# Patient Record
Sex: Male | Born: 1935 | Race: White | Hispanic: No | Marital: Married | Smoking: Former smoker
Health system: Southern US, Community
[De-identification: ages and names within clinical notes are randomized; demographics above are authoritative.]

## PROBLEM LIST (undated history)

## (undated) DIAGNOSIS — Z860100 Personal history of colon polyps, unspecified: Secondary | ICD-10-CM

## (undated) DIAGNOSIS — I2510 Atherosclerotic heart disease of native coronary artery without angina pectoris: Secondary | ICD-10-CM

## (undated) DIAGNOSIS — M069 Rheumatoid arthritis, unspecified: Secondary | ICD-10-CM

## (undated) DIAGNOSIS — E785 Hyperlipidemia, unspecified: Secondary | ICD-10-CM

## (undated) DIAGNOSIS — M549 Dorsalgia, unspecified: Secondary | ICD-10-CM

## (undated) DIAGNOSIS — F0390 Unspecified dementia without behavioral disturbance: Secondary | ICD-10-CM

## (undated) DIAGNOSIS — M1990 Unspecified osteoarthritis, unspecified site: Secondary | ICD-10-CM

## (undated) DIAGNOSIS — I10 Essential (primary) hypertension: Secondary | ICD-10-CM

## (undated) DIAGNOSIS — I739 Peripheral vascular disease, unspecified: Secondary | ICD-10-CM

## (undated) DIAGNOSIS — J449 Chronic obstructive pulmonary disease, unspecified: Secondary | ICD-10-CM

## (undated) HISTORY — DX: Rheumatoid arthritis, unspecified: M06.9

## (undated) HISTORY — DX: Unspecified osteoarthritis, unspecified site: M19.90

## (undated) HISTORY — DX: Hyperlipidemia, unspecified: E78.5

## (undated) HISTORY — DX: Atherosclerotic heart disease of native coronary artery without angina pectoris: I25.10

## (undated) HISTORY — DX: Unspecified dementia, unspecified severity, without behavioral disturbance, psychotic disturbance, mood disturbance, and anxiety: F03.90

## (undated) HISTORY — DX: Essential (primary) hypertension: I10

## (undated) HISTORY — DX: Personal history of colon polyps, unspecified: Z86.0100

## (undated) HISTORY — PX: ILIAC ARTERY STENT: SHX1786

## (undated) HISTORY — DX: Personal history of colonic polyps: Z86.010

## (undated) HISTORY — DX: Peripheral vascular disease, unspecified: I73.9

## (undated) HISTORY — DX: Chronic obstructive pulmonary disease, unspecified: J44.9

---

## 1997-10-21 ENCOUNTER — Other Ambulatory Visit: Admission: RE | Admit: 1997-10-21 | Discharge: 1997-10-21 | Payer: Self-pay | Admitting: Cardiology

## 1997-10-24 ENCOUNTER — Ambulatory Visit (HOSPITAL_COMMUNITY): Admission: RE | Admit: 1997-10-24 | Discharge: 1997-10-24 | Payer: Self-pay | Admitting: Cardiology

## 1997-12-17 ENCOUNTER — Ambulatory Visit (HOSPITAL_COMMUNITY): Admission: RE | Admit: 1997-12-17 | Discharge: 1997-12-17 | Payer: Self-pay | Admitting: Critical Care Medicine

## 1998-07-21 ENCOUNTER — Ambulatory Visit (HOSPITAL_COMMUNITY): Admission: RE | Admit: 1998-07-21 | Discharge: 1998-07-21 | Payer: Self-pay | Admitting: Critical Care Medicine

## 1998-07-21 ENCOUNTER — Encounter: Payer: Self-pay | Admitting: Critical Care Medicine

## 1998-11-16 ENCOUNTER — Other Ambulatory Visit: Admission: RE | Admit: 1998-11-16 | Discharge: 1998-11-16 | Payer: Self-pay | Admitting: Gastroenterology

## 2000-03-09 ENCOUNTER — Ambulatory Visit (HOSPITAL_COMMUNITY): Admission: RE | Admit: 2000-03-09 | Discharge: 2000-03-09 | Payer: Self-pay | Admitting: Orthopedic Surgery

## 2000-03-09 ENCOUNTER — Encounter: Payer: Self-pay | Admitting: Orthopedic Surgery

## 2000-04-12 ENCOUNTER — Ambulatory Visit (HOSPITAL_COMMUNITY): Admission: RE | Admit: 2000-04-12 | Discharge: 2000-04-12 | Payer: Self-pay | Admitting: Orthopedic Surgery

## 2000-04-12 ENCOUNTER — Encounter: Payer: Self-pay | Admitting: Orthopedic Surgery

## 2001-05-04 ENCOUNTER — Emergency Department (HOSPITAL_COMMUNITY): Admission: EM | Admit: 2001-05-04 | Discharge: 2001-05-04 | Payer: Self-pay | Admitting: Emergency Medicine

## 2001-05-09 ENCOUNTER — Emergency Department (HOSPITAL_COMMUNITY): Admission: EM | Admit: 2001-05-09 | Discharge: 2001-05-09 | Payer: Self-pay | Admitting: Emergency Medicine

## 2003-10-13 ENCOUNTER — Encounter: Admission: RE | Admit: 2003-10-13 | Discharge: 2003-10-13 | Payer: Self-pay | Admitting: Critical Care Medicine

## 2004-01-26 ENCOUNTER — Ambulatory Visit (HOSPITAL_BASED_OUTPATIENT_CLINIC_OR_DEPARTMENT_OTHER): Admission: RE | Admit: 2004-01-26 | Discharge: 2004-01-26 | Payer: Self-pay | Admitting: Orthopedic Surgery

## 2004-01-26 ENCOUNTER — Ambulatory Visit (HOSPITAL_COMMUNITY): Admission: RE | Admit: 2004-01-26 | Discharge: 2004-01-26 | Payer: Self-pay | Admitting: Orthopedic Surgery

## 2004-05-17 ENCOUNTER — Ambulatory Visit: Payer: Self-pay | Admitting: Critical Care Medicine

## 2004-06-15 ENCOUNTER — Ambulatory Visit: Payer: Self-pay | Admitting: Critical Care Medicine

## 2004-07-14 ENCOUNTER — Ambulatory Visit: Payer: Self-pay | Admitting: Critical Care Medicine

## 2004-08-26 ENCOUNTER — Ambulatory Visit: Payer: Self-pay | Admitting: Critical Care Medicine

## 2004-10-04 ENCOUNTER — Ambulatory Visit: Payer: Self-pay | Admitting: Critical Care Medicine

## 2004-11-29 ENCOUNTER — Ambulatory Visit: Payer: Self-pay | Admitting: Critical Care Medicine

## 2005-01-31 ENCOUNTER — Ambulatory Visit: Payer: Self-pay | Admitting: Internal Medicine

## 2005-04-04 ENCOUNTER — Ambulatory Visit: Payer: Self-pay | Admitting: Critical Care Medicine

## 2005-07-06 ENCOUNTER — Ambulatory Visit: Payer: Self-pay | Admitting: Critical Care Medicine

## 2005-08-11 ENCOUNTER — Ambulatory Visit: Payer: Self-pay | Admitting: Critical Care Medicine

## 2005-09-23 ENCOUNTER — Ambulatory Visit: Payer: Self-pay | Admitting: Critical Care Medicine

## 2005-10-24 ENCOUNTER — Ambulatory Visit: Payer: Self-pay | Admitting: Critical Care Medicine

## 2005-12-08 ENCOUNTER — Ambulatory Visit: Payer: Self-pay | Admitting: Internal Medicine

## 2005-12-21 ENCOUNTER — Ambulatory Visit: Payer: Self-pay | Admitting: Critical Care Medicine

## 2006-01-09 ENCOUNTER — Ambulatory Visit: Payer: Self-pay | Admitting: Pulmonary Disease

## 2006-01-16 ENCOUNTER — Ambulatory Visit: Payer: Self-pay | Admitting: Critical Care Medicine

## 2006-01-17 ENCOUNTER — Encounter: Payer: Self-pay | Admitting: Critical Care Medicine

## 2006-01-17 ENCOUNTER — Ambulatory Visit: Payer: Self-pay | Admitting: Cardiology

## 2006-01-17 ENCOUNTER — Emergency Department (HOSPITAL_COMMUNITY): Admission: EM | Admit: 2006-01-17 | Discharge: 2006-01-17 | Payer: Self-pay | Admitting: *Deleted

## 2006-01-23 ENCOUNTER — Ambulatory Visit: Payer: Self-pay | Admitting: Pulmonary Disease

## 2006-01-29 ENCOUNTER — Emergency Department (HOSPITAL_COMMUNITY): Admission: EM | Admit: 2006-01-29 | Discharge: 2006-01-29 | Payer: Self-pay | Admitting: Emergency Medicine

## 2006-02-07 ENCOUNTER — Ambulatory Visit: Payer: Self-pay | Admitting: Critical Care Medicine

## 2006-03-13 ENCOUNTER — Ambulatory Visit: Payer: Self-pay | Admitting: Critical Care Medicine

## 2006-04-13 ENCOUNTER — Ambulatory Visit: Payer: Self-pay | Admitting: Critical Care Medicine

## 2006-04-28 ENCOUNTER — Encounter: Admission: RE | Admit: 2006-04-28 | Discharge: 2006-04-28 | Payer: Self-pay | Admitting: Internal Medicine

## 2006-06-16 ENCOUNTER — Ambulatory Visit: Payer: Self-pay | Admitting: Critical Care Medicine

## 2006-06-29 ENCOUNTER — Ambulatory Visit: Payer: Self-pay | Admitting: Pulmonary Disease

## 2006-10-20 ENCOUNTER — Ambulatory Visit: Payer: Self-pay | Admitting: Critical Care Medicine

## 2007-03-21 DIAGNOSIS — I2510 Atherosclerotic heart disease of native coronary artery without angina pectoris: Secondary | ICD-10-CM

## 2007-03-21 DIAGNOSIS — E785 Hyperlipidemia, unspecified: Secondary | ICD-10-CM

## 2007-03-21 DIAGNOSIS — I10 Essential (primary) hypertension: Secondary | ICD-10-CM

## 2007-03-21 DIAGNOSIS — J449 Chronic obstructive pulmonary disease, unspecified: Secondary | ICD-10-CM

## 2007-03-21 DIAGNOSIS — Z860100 Personal history of colon polyps, unspecified: Secondary | ICD-10-CM | POA: Insufficient documentation

## 2007-03-21 DIAGNOSIS — M1990 Unspecified osteoarthritis, unspecified site: Secondary | ICD-10-CM | POA: Insufficient documentation

## 2007-04-13 ENCOUNTER — Encounter: Admission: RE | Admit: 2007-04-13 | Discharge: 2007-04-13 | Payer: Self-pay | Admitting: Internal Medicine

## 2007-05-02 ENCOUNTER — Ambulatory Visit: Payer: Self-pay | Admitting: Critical Care Medicine

## 2007-05-18 ENCOUNTER — Ambulatory Visit: Payer: Self-pay | Admitting: Critical Care Medicine

## 2007-06-26 ENCOUNTER — Inpatient Hospital Stay (HOSPITAL_COMMUNITY): Admission: RE | Admit: 2007-06-26 | Discharge: 2007-06-26 | Payer: Self-pay | Admitting: Cardiology

## 2007-06-27 ENCOUNTER — Ambulatory Visit: Payer: Self-pay | Admitting: Critical Care Medicine

## 2007-06-27 DIAGNOSIS — I739 Peripheral vascular disease, unspecified: Secondary | ICD-10-CM

## 2007-06-28 ENCOUNTER — Telehealth (INDEPENDENT_AMBULATORY_CARE_PROVIDER_SITE_OTHER): Payer: Self-pay | Admitting: *Deleted

## 2007-07-09 ENCOUNTER — Telehealth (INDEPENDENT_AMBULATORY_CARE_PROVIDER_SITE_OTHER): Payer: Self-pay | Admitting: *Deleted

## 2007-10-02 ENCOUNTER — Encounter: Admission: RE | Admit: 2007-10-02 | Discharge: 2007-10-02 | Payer: Self-pay | Admitting: Internal Medicine

## 2007-10-03 ENCOUNTER — Ambulatory Visit: Payer: Self-pay | Admitting: Critical Care Medicine

## 2007-10-03 DIAGNOSIS — J309 Allergic rhinitis, unspecified: Secondary | ICD-10-CM | POA: Insufficient documentation

## 2007-10-05 ENCOUNTER — Encounter: Payer: Self-pay | Admitting: Critical Care Medicine

## 2007-11-07 ENCOUNTER — Ambulatory Visit: Payer: Self-pay | Admitting: Internal Medicine

## 2007-11-07 DIAGNOSIS — J209 Acute bronchitis, unspecified: Secondary | ICD-10-CM

## 2008-02-10 ENCOUNTER — Inpatient Hospital Stay (HOSPITAL_COMMUNITY): Admission: EM | Admit: 2008-02-10 | Discharge: 2008-02-14 | Payer: Self-pay | Admitting: Emergency Medicine

## 2008-03-24 ENCOUNTER — Telehealth (INDEPENDENT_AMBULATORY_CARE_PROVIDER_SITE_OTHER): Payer: Self-pay | Admitting: *Deleted

## 2008-08-08 ENCOUNTER — Ambulatory Visit: Payer: Self-pay | Admitting: Emergency Medicine

## 2009-02-16 ENCOUNTER — Emergency Department (HOSPITAL_COMMUNITY): Admission: EM | Admit: 2009-02-16 | Discharge: 2009-02-16 | Payer: Self-pay | Admitting: Emergency Medicine

## 2009-02-18 ENCOUNTER — Telehealth: Payer: Self-pay | Admitting: Critical Care Medicine

## 2009-02-20 ENCOUNTER — Ambulatory Visit: Payer: Self-pay | Admitting: Critical Care Medicine

## 2009-03-27 ENCOUNTER — Ambulatory Visit: Payer: Self-pay | Admitting: Critical Care Medicine

## 2009-04-15 ENCOUNTER — Ambulatory Visit: Payer: Self-pay | Admitting: Critical Care Medicine

## 2009-05-12 ENCOUNTER — Ambulatory Visit: Payer: Self-pay | Admitting: Pulmonary Disease

## 2009-05-12 ENCOUNTER — Telehealth (INDEPENDENT_AMBULATORY_CARE_PROVIDER_SITE_OTHER): Payer: Self-pay | Admitting: *Deleted

## 2009-05-14 ENCOUNTER — Ambulatory Visit: Payer: Self-pay | Admitting: Critical Care Medicine

## 2009-05-14 ENCOUNTER — Telehealth (INDEPENDENT_AMBULATORY_CARE_PROVIDER_SITE_OTHER): Payer: Self-pay | Admitting: *Deleted

## 2009-05-14 ENCOUNTER — Ambulatory Visit: Payer: Self-pay | Admitting: Diagnostic Radiology

## 2009-05-14 ENCOUNTER — Ambulatory Visit (HOSPITAL_BASED_OUTPATIENT_CLINIC_OR_DEPARTMENT_OTHER): Admission: RE | Admit: 2009-05-14 | Discharge: 2009-05-14 | Payer: Self-pay | Admitting: Critical Care Medicine

## 2009-05-21 ENCOUNTER — Ambulatory Visit: Payer: Self-pay | Admitting: Critical Care Medicine

## 2009-06-10 ENCOUNTER — Telehealth (INDEPENDENT_AMBULATORY_CARE_PROVIDER_SITE_OTHER): Payer: Self-pay | Admitting: *Deleted

## 2009-06-10 ENCOUNTER — Ambulatory Visit: Payer: Self-pay | Admitting: Critical Care Medicine

## 2009-06-10 DIAGNOSIS — J018 Other acute sinusitis: Secondary | ICD-10-CM

## 2009-06-11 ENCOUNTER — Ambulatory Visit: Payer: Self-pay | Admitting: Internal Medicine

## 2009-06-15 ENCOUNTER — Encounter: Payer: Self-pay | Admitting: Critical Care Medicine

## 2009-06-26 ENCOUNTER — Encounter: Payer: Self-pay | Admitting: Critical Care Medicine

## 2009-07-21 ENCOUNTER — Encounter: Payer: Self-pay | Admitting: Critical Care Medicine

## 2009-08-11 HISTORY — PX: CORONARY ANGIOPLASTY: SHX604

## 2009-08-20 ENCOUNTER — Ambulatory Visit: Payer: Self-pay | Admitting: Critical Care Medicine

## 2009-08-31 ENCOUNTER — Ambulatory Visit: Payer: Self-pay | Admitting: Cardiology

## 2009-08-31 ENCOUNTER — Ambulatory Visit: Payer: Self-pay | Admitting: Internal Medicine

## 2009-08-31 ENCOUNTER — Encounter: Payer: Self-pay | Admitting: Emergency Medicine

## 2009-08-31 ENCOUNTER — Inpatient Hospital Stay (HOSPITAL_COMMUNITY): Admission: EM | Admit: 2009-08-31 | Discharge: 2009-09-02 | Payer: Self-pay | Admitting: Internal Medicine

## 2009-09-15 ENCOUNTER — Encounter: Payer: Self-pay | Admitting: Physician Assistant

## 2009-09-15 ENCOUNTER — Ambulatory Visit: Payer: Self-pay | Admitting: Internal Medicine

## 2009-10-20 ENCOUNTER — Encounter: Payer: Self-pay | Admitting: Cardiology

## 2009-10-22 ENCOUNTER — Ambulatory Visit: Payer: Self-pay | Admitting: Critical Care Medicine

## 2009-10-22 ENCOUNTER — Telehealth (INDEPENDENT_AMBULATORY_CARE_PROVIDER_SITE_OTHER): Payer: Self-pay | Admitting: *Deleted

## 2009-10-23 LAB — CONVERTED CEMR LAB
Alkaline Phosphatase: 66 units/L (ref 39–117)
Bilirubin, Direct: 0.2 mg/dL (ref 0.0–0.3)
Indirect Bilirubin: 0.7 mg/dL (ref 0.0–0.9)
LDL Cholesterol: 82 mg/dL (ref 0–99)
Total Bilirubin: 0.9 mg/dL (ref 0.3–1.2)
Total Protein: 7.4 g/dL (ref 6.0–8.3)
VLDL: 34 mg/dL (ref 0–40)

## 2009-11-19 ENCOUNTER — Encounter: Payer: Self-pay | Admitting: Critical Care Medicine

## 2009-12-09 ENCOUNTER — Ambulatory Visit: Payer: Self-pay | Admitting: Cardiology

## 2009-12-09 ENCOUNTER — Encounter: Payer: Self-pay | Admitting: Cardiology

## 2009-12-09 DIAGNOSIS — I209 Angina pectoris, unspecified: Secondary | ICD-10-CM

## 2010-02-24 ENCOUNTER — Encounter: Payer: Self-pay | Admitting: Cardiology

## 2010-02-24 ENCOUNTER — Ambulatory Visit: Payer: Self-pay | Admitting: Cardiology

## 2010-06-17 ENCOUNTER — Ambulatory Visit
Admission: RE | Admit: 2010-06-17 | Discharge: 2010-06-17 | Payer: Self-pay | Source: Home / Self Care | Attending: Critical Care Medicine | Admitting: Critical Care Medicine

## 2010-07-15 NOTE — Assessment & Plan Note (Signed)
Summary: Pulmonary OV   Primary Provider/Referring Provider:  Larina Earthly  CC:  Follow up.  Pt states breathing is doing good overall.  head congestion, runny nose, and prod cough with clear mucus x 4-5 days. Denies wheezing and chest tightness.Marland Kitchen  History of Present Illness:  77 year old, white male with chronic obstructive lung disease.   August 20, 2009 12:11 PM Doing ok since early dec/10 The pt notes occ sinus issues.  The dyspnea is at baseline.    There have been no flare ups of mucous since back on advair. Pt denies any increase in rescue therapy over baseline, denies waking up needing it or having any early am or nocturnal exacerbations of coughing/wheezing/or dyspnea. Pt denies any significant sore throat, nasal congestion or excess secretions, fever, chills, sweats, unintended weight loss, pleurtic or exertional chest pain, orthopnea PND, or leg swelling   Oct 22, 2009 10:58 AM Pt had AMI  3/21- 3/23.   had adverse reaction to [place]. Had difficulty with dyspnea while in hospital.  PTCA of first OM.  had NON Stemi  no further cp since hosp stay,  no cough now,  dyspnea is ok   June 17, 2010 3:08 PM Doing ok since last ov , now though has a uri wiht pndrip and cough sl amount of phlegm color of mucus is white.  no real chest pain.  no real wheeze.  Dyspnea is the same.   symptoms worse for 4 days  August 20, 2009 12:11 PM Doing ok since early dec/10 occ sinus issues.  dyspnea is at baseline.    no flare ups of mucous since back on advair.   Current Medications (verified): 1)  Ecotrin 325 Mg Tbec (Aspirin) .... Take One Daily 2)  Aricept 23 Mg Tabs (Donepezil Hcl) .... Take One Daily 3)  Advair Diskus 250-50 Mcg/dose  Misc (Fluticasone-Salmeterol) .... One Puff Twice Daily 4)  Proair Hfa 108 (90 Base) Mcg/act Aers (Albuterol Sulfate) .Marland Kitchen.. 1-2 Puffs Every 4-6 Hours As Needed For Shortness of Breath 5)  Nitrostat 0.4 Mg Subl (Nitroglycerin) .... Take One As Needed 6)   Simvastatin 40 Mg Tabs (Simvastatin) .... Take One Daily 7)  Lumigan 0.03 % Soln (Bimatoprost) .... Once A Day 8)  Amlodipine Besylate 5 Mg Tabs (Amlodipine Besylate) .... Take One Tablet By Mouth Daily 9)  Trazodone Hcl 50 Mg Tabs (Trazodone Hcl) .... As Needed 10)  Vitamin B-12 1000 Mcg Tabs (Cyanocobalamin) .... Take 1 Tablet By Mouth Once A Day 11)  Vitamin D 1000 Unit Tabs (Cholecalciferol) .... Take 1 Tablet By Mouth Once A Day  Allergies (verified): 1)  ! Penicillin 2)  ! * Ambien  Past History:  Past medical, surgical, family and social histories (including risk factors) reviewed, and no changes noted (except as noted below).  Past Medical History: Reviewed history from 12/09/2009 and no changes required. PERIPHERAL VASCULAR DISEASE (ICD-443.9) OSTEOARTHRITIS (ICD-715.90) HYPERTENSION (ICD-401.9) HYPERLIPIDEMIA (ICD-272.4) CORONARY ARTERY DISEASE (ICD-414.00)    -Non Stemi 3/11    -PTCA first OM 3/11 COPD (ICD-496) COLONIC POLYPS, HX OF (ICD-V12.72) Dementia    Past Surgical History: Reviewed history from 10/22/2009 and no changes required.  PVD S/P stenting of left common iliac and external iliac Cath with PTCA first OM 3/11  Family History: Reviewed history from 10/03/2007 and no changes required. non contrib  Social History: Reviewed history from 12/09/2009 and no changes required. The patient lives in [place] with his wife.  He still   works part-time (not physical work).  He has remote tobacco abuse, quitting 18 years ago.  He drinks approximately six beers per week and  denies any binge drinking.  No illicit drugs.  No herbal medications,  regular diet.  No regular exercise.   Review of Systems       The patient complains of shortness of breath with activity, productive cough, and non-productive cough.  The patient denies shortness of breath at rest, coughing up blood, chest pain, irregular heartbeats, acid heartburn, indigestion, loss of appetite, weight  change, abdominal pain, difficulty swallowing, sore throat, tooth/dental problems, headaches, nasal congestion/difficulty breathing through nose, sneezing, itching, ear ache, anxiety, depression, hand/feet swelling, joint stiffness or pain, rash, change in color of mucus, and fever.    Vital Signs:  Patient profile:   77 year old male Height:      67 inches Weight:      203 pounds BMI:     31.91 O2 Sat:      96 % on Room air Temp:     98.1 degrees F oral Pulse rate:   58 / minute BP sitting:   140 / 70  (left arm) Cuff size:   large  Vitals Entered By: Gweneth Dimitri RN (June 17, 2010 2:55 PM)  O2 Flow:  Room air CC: Follow up.  Pt states breathing is doing good overall.  head congestion, runny nose, prod cough with clear mucus x 4-5 days. Denies wheezing and chest tightness. Comments Medications reviewed with patient Daytime contact number verified with patient. Gweneth Dimitri RN  June 17, 2010 3:00 PM    Physical Exam  Additional Exam:  GENERAL:  A/Ox3; pleasant & cooperative.NAD HEENT:  [place]/AT, EOM-wnl, PERRLA, EACs-clear, TMs-wnl, NOSE-bilateral nasal purulencer, THROAT-clear & wnl. NECK:  Supple w/ fair ROM; no JVD; normal carotid impulses w/o bruits; no thyromegaly or nodules palpated; no lymphadenopathy. CHEST:  distant BS, improved airflow HEART:  RRR, no m/r/g  heard ABDOMEN:  Soft & nt; nml bowel sounds; no organomegaly or masses detected. EXT: Warm bilat,  no calf pain, edema, clubbing, pulses intact Skin: no rash/lesion    Impression & Recommendations:  Problem # 1:  ACUTE BRONCHITIS (ICD-466.0) Assessment Deteriorated mild acute tracheobronchitis flare  plan zpak  no steroids cont inhaled meds as prescribed His updated medication list for this problem includes:    Advair Diskus 250-50 Mcg/dose Misc (Fluticasone-salmeterol) ..... One puff twice daily    Proair Hfa 108 (90 Base) Mcg/act Aers (Albuterol sulfate) .Marland Kitchen... 1-2 puffs every 4-6 hours as needed for  shortness of breath    Azithromycin 250 Mg Tabs (Azithromycin) .Marland Kitchen..Marland Kitchen Two by mouth now and then one daily and stay  Medications Added to Medication List This Visit: 1)  Azithromycin 250 Mg Tabs (Azithromycin) .... Two by mouth now and then one daily and stay  Complete Medication List: 1)  Ecotrin 325 Mg Tbec (Aspirin) .... Take one daily 2)  Aricept 23 Mg Tabs (Donepezil hcl) .... Take one daily 3)  Advair Diskus 250-50 Mcg/dose Misc (Fluticasone-salmeterol) .... One puff twice daily 4)  Proair Hfa 108 (90 Base) Mcg/act Aers (Albuterol sulfate) .Marland Kitchen.. 1-2 puffs every 4-6 hours as needed for shortness of breath 5)  Nitrostat 0.4 Mg Subl (Nitroglycerin) .... Take one as needed 6)  Simvastatin 40 Mg Tabs (Simvastatin) .... Take one daily 7)  Lumigan 0.03 % Soln (Bimatoprost) .... Once a day 8)  Amlodipine Besylate 5 Mg Tabs (Amlodipine besylate) .... Take one tablet by mouth daily 9)  Trazodone Hcl 50 Mg  Tabs (Trazodone hcl) .... As needed 10)  Vitamin B-12 1000 Mcg Tabs (Cyanocobalamin) .... Take 1 tablet by mouth once a day 11)  Vitamin D 1000 Unit Tabs (Cholecalciferol) .... Take 1 tablet by mouth once a day 12)  Azithromycin 250 Mg Tabs (Azithromycin) .... Two by mouth now and then one daily and stay  Other Orders: Est. Patient Level IV (18299)  Patient Instructions: 1)  Zithromax two first day then one a day and stay 2)  No other medication changes 3)  Return 4 months  Prescriptions: AZITHROMYCIN 250 MG TABS (AZITHROMYCIN) Two by mouth now and then one daily and stay  #1 pak x 0   Entered and Authorized by:   Storm Frisk MD   Signed by:   Storm Frisk MD on 06/17/2010   Method used:   Electronically to        [facility]  [address] [phone]* (retail)       [address]       [place], [place]  [address]       Ph: [phone]       Fax: [phone]   RxID:   [id number] PROAIR HFA 108 (90 BASE) MCG/ACT AERS (ALBUTEROL SULFATE) 1-2 puffs every 4-6 hours as needed for shortness  of breath  #1 x 6   Entered and Authorized by:   Storm Frisk MD   Signed by:   Storm Frisk MD on 06/17/2010   Method used:   Electronically to        [facility]  [address] [phone]* (retail)       [address]       [place], [place]  [address]       Ph: [phone]       Fax: [phone]   RxID:   [id number] ADVAIR DISKUS 250-50 MCG/DOSE  MISC (FLUTICASONE-SALMETEROL) One puff twice daily  #60 Each x 6   Entered and Authorized by:   Storm Frisk MD   Signed by:   Storm Frisk MD on 06/17/2010   Method used:   Electronically to        [facility]  [address] [phone]* (retail)       [address]       [place], [place]  [address]       Ph: [phone]       Fax: [phone]   RxID:   [id number]    Immunization History:  Influenza Immunization History:    Influenza:  historical (03/13/2010)    Prevention & Chronic Care Immunizations   Influenza vaccine: Historical  (03/13/2010)    Tetanus booster: Not documented    Pneumococcal vaccine: Pneumovax (Medicare)  (03/27/2009)    H. zoster vaccine: Not documented  Colorectal Screening   Hemoccult: Not documented    Colonoscopy: Not documented  Other Screening   PSA: Not documented   Smoking status: quit > 6 months  (10/22/2009)  Lipids   Total Cholesterol: 154  (10/20/2009)   LDL: 82  (10/20/2009)   LDL Direct: Not documented   HDL: 38  (10/20/2009)   Triglycerides: 172  (10/20/2009)    SGOT (AST): 21  (10/20/2009)   SGPT (ALT): 15  (10/20/2009)   Alkaline phosphatase: 66  (10/20/2009)   Total bilirubin: 0.9  (10/20/2009)  Hypertension   Last Blood Pressure: 140 / 70  (06/17/2010)   Serum creatinine: Not documented   Serum potassium Not documented  Self-Management Support :    Hypertension  self-management support: Not documented    Lipid self-management support: Not documented

## 2010-07-15 NOTE — Progress Notes (Signed)
Summary: Ventolin changed to [employer]  Phone Note Outgoing Call   Call placed by: Michel Bickers CMA,  Oct 22, 2009 3:51 PM Call placed to: Patient Summary of Call: Form received from [facility] in [place]. Pt's insurance will not cover the Ventolin HFA inhaler but will cover Proair. New RX sent for Proair to the pharmacy and the patient is aware and is okay with the change. I will forward to Dr. Delford Field so he is aware of the change. The patient's medication list has been updated. Initial call taken by: Michel Bickers CMA,  Oct 22, 2009 3:54 PM  Follow-up for Phone Call        ok  to change  Follow-up by: Storm Frisk MD,  Oct 23, 2009 6:33 AM    New/Updated Medications: PROAIR HFA 108 (90 BASE) MCG/ACT AERS (ALBUTEROL SULFATE) 1-2 puffs every 4-6 hours as needed for shortness of breath Prescriptions: PROAIR HFA 108 (90 BASE) MCG/ACT AERS (ALBUTEROL SULFATE) 1-2 puffs every 4-6 hours as needed for shortness of breath  #1 x 6   Entered by:   Michel Bickers CMA   Authorized by:   Storm Frisk MD   Signed by:   Michel Bickers CMA on 10/22/2009   Method used:   Electronically to        [facility]  [id number] (retail)       [address]       [place], [place]  [address]       Ph: [phone]       Fax: [phone]   RxID:   [id number]

## 2010-07-15 NOTE — Miscellaneous (Signed)
Summary: Orders Update   Clinical Lists Changes  Orders: Added new Referral order of ENT Referral (ENT) - Signed 

## 2010-07-15 NOTE — Assessment & Plan Note (Signed)
Summary: eph/jml   Visit Type:  Follow-up Primary Provider:  Larina Earthly  CC:  sob.  History of Present Illness: this is a 77, white male patient, who had a non-ST elevation MI August 31, 2009 treated with angioplasty of a first obtuse marginal. He had an old total occlusion of the RCA and moderate nonobstructive disease in the LAD. Stenting was not performed secondary to the small size of the vessel.  The patient has had a few twinges in his left chest since his MI, but denies any chest pain, radiation to his arm or neck or symptoms that brought him to the hospital. He has chronic dyspnea on exertion, secondary to his COPD. He is walking twice daily without difficulty.  Current Medications (verified): 1)  Ecotrin 325 Mg Tbec (Aspirin) .... Take One Daily 2)  Aricept 23 Mg Tabs (Donepezil Hcl) .... Take One Daily 3)  Advair Diskus 250-50 Mcg/dose  Misc (Fluticasone-Salmeterol) .... One Puff Twice Daily 4)  Ventolin Hfa 108 (90 Base) Mcg/act  Aers (Albuterol Sulfate) .Marland Kitchen.. 1-2 Puffs Every 4-6 Hours As Needed 5)  Vitamin D 1000 Unit Tabs (Cholecalciferol) .... Once Daily 6)  Vitamin B-12 1000 Mcg Tabs (Cyanocobalamin) .... Two Times A Day 7)  Nasonex 50 Mcg/act  Susp (Mometasone Furoate) .... Two Puffs Each Nostril Daily 8)  Plavix 75 Mg Tabs (Clopidogrel Bisulfate) .... Take One Daily 9)  Nitrostat 0.4 Mg Subl (Nitroglycerin) .... Take One As Needed 10)  Simvastatin 40 Mg Tabs (Simvastatin) .... Take One Daily  Allergies: 1)  ! Penicillin 2)  ! * Ambien  Past History:  Past Medical History: Last updated: 11/07/2007 ALLERGIC RHINITIS (ICD-477.9) PERIPHERAL VASCULAR DISEASE (ICD-443.9) OSTEOARTHRITIS (ICD-715.90) HYPERTENSION (ICD-401.9) HYPERLIPIDEMIA (ICD-272.4) CORONARY ARTERY DISEASE (ICD-414.00) COPD (ICD-496) COLONIC POLYPS, HX OF (ICD-V12.72)    Review of Systems       see the history of present illness  Vital Signs:  Patient profile:   77 year old male Height:       67 inches Weight:      205 pounds Pulse rate:   60 / minute Pulse rhythm:   regular BP sitting:   120 / 80  (left arm)  Vitals Entered By: Jacquelin Hawking, CMA (September 15, 2009 11:03 AM)  Physical Exam  General:   Well-nournished, in no acute distress. Neck: No JVD, HJR, Bruit, or thyroid enlargement Lungs: Decreased breath sounds,No tachypnea, clear without wheezing, rales, or rhonchi Cardiovascular: RRR, PMI not displaced, heart sounds normal, no murmurs, gallops, bruit, thrill, or heave. Abdomen: BS normal. Soft without organomegaly, masses, lesions or tenderness. Extremities: right groin without hematoma or hemorrhage,without cyanosis, clubbing or edema. Good distal pulses bilateral SKin: Warm, no lesions or rashes  Musculoskeletal: No deformities Neuro: no focal signs    EKG  Procedure date:  09/15/2009  Findings:      sinus bradycardia with T wave inversion inferiorly  Impression & Recommendations:  Problem # 1:  CORONARY ARTERY DISEASE (ICD-414.00) Patient suffered a non-ST elevation MI September 01, 2009 treated with balloon angioplasty of a totally occluded small subbranch of the first obtuse marginal. This vessel was too small for stent placement. He has an old total RCA, and 50% residual LAD. Patient is doing well without further chest pain His updated medication list for this problem includes:    Ecotrin 325 Mg Tbec (Aspirin) .Marland Kitchen... Take one daily    Plavix 75 Mg Tabs (Clopidogrel bisulfate) .Marland Kitchen... Take one daily    Nitrostat 0.4 Mg Subl (Nitroglycerin) .Marland KitchenMarland KitchenMarland KitchenMarland KitchenMarland Kitchen  Take one as needed  Orders: EKG w/ Interpretation (93000)  Problem # 2:  HYPERLIPIDEMIA (ICD-272.4) This medication is new for this patient. We will check lipid panel and LFTs in 5 weeks His updated medication list for this problem includes:    Simvastatin 40 Mg Tabs (Simvastatin) .Marland Kitchen... Take one daily  Problem # 3:  HYPERTENSION (ICD-401.9) Blood pressure is stable His updated medication list for this problem  includes:    Ecotrin 325 Mg Tbec (Aspirin) .Marland Kitchen... Take one daily  Problem # 4:  COPD (ICD-496) Followed by Dr. Delford Field. His updated medication list for this problem includes:    Advair Diskus 250-50 Mcg/dose Misc (Fluticasone-salmeterol) ..... One puff twice daily    Ventolin Hfa 108 (90 Base) Mcg/act Aers (Albuterol sulfate) .Marland Kitchen... 1-2 puffs every 4-6 hours as needed  Patient Instructions: 1)  Your physician recommends that you schedule a follow-up appointment in: Pt. has an appointment with Dr. Jens Som on June 29th at 2:00 PM in the [place] office. 2)  Your physician recommends that you return for a FASTING lipid profile: and LFT in 5 weeks in the [place] office. 272.0 treated with balloon angioplasty of a totally occluded small subbranch of the first obtuse marginal. This vessel was too small for stent placement. He has an old total RCA, and 50% residual LAD. Patient is doing well without further chest pain His updated medication list for this problem includes:    Ecotrin 325 Mg Tbec (Aspirin) .Marland Kitchen... Take one daily    Plavix 75 Mg Tabs (Clopidogrel bisulfate) .Marland Kitchen... Take one daily    Nitrostat 0.4 Mg Subl (Nitroglycerin) .Marland KitchenMarland KitchenMarland KitchenMarland Kitchen  Take one as needed  Orders: EKG w/ Interpretation (93000)  Problem # 2:  HYPERLIPIDEMIA (ICD-272.4) This medication is new for this patient. We will check lipid panel and LFTs in 5 weeks His updated medication list for this problem includes:    Simvastatin 40 Mg Tabs (Simvastatin) .Marland Kitchen... Take one daily  Problem # 3:  HYPERTENSION (ICD-401.9) Blood pressure is stable His updated medication list for this problem  includes:    Ecotrin 325 Mg Tbec (Aspirin) .Marland Kitchen... Take one daily  Problem # 4:  COPD (ICD-496) Followed by Dr. Delford Field. His updated medication list for this problem includes:    Advair Diskus 250-50 Mcg/dose Misc (Fluticasone-salmeterol) ..... One puff twice daily    Ventolin Hfa 108 (90 Base) Mcg/act Aers (Albuterol sulfate) .Marland Kitchen... 1-2 puffs every 4-6 hours as needed  Patient Instructions: 1)  Your physician recommends that you schedule a follow-up appointment in: Pt. has an appointment with Dr. Jens Som on June 29th at 2:00 PM in the [place] office. 2)  Your physician recommends that you return for a FASTING lipid profile: and LFT in 5 weeks in the [place] office. 272.0

## 2010-07-15 NOTE — Miscellaneous (Signed)
Summary: Orders Update  Clinical Lists Changes  Orders: Added new Service order of Est. Patient Level III (99213) - Signed 

## 2010-07-15 NOTE — Assessment & Plan Note (Signed)
Summary: [facility]   Visit Type:  Follow-up Primary Provider:  Larina Earthly  CC:  No complains.  History of Present Illness: 77 year old male with past medical history of coronary artery disease for followup. Patient was admitted to [facility] in March of 2011 following a non-ST elevation myocardial infarction. Cardiac catheterization was performed at that time and revealed a 25% LM.  The LAD  had heavy calcification with long proximal 50% stenosis.  There was mid 50% stenosis.  There was distal 60% stenosis.  First diagonal was moderate sized with luminal irregularities.  The circumflex in the AV  groove had proximal 30% stenosis.  There was a mid obtuse marginal, which was a moderate-sized branching vessel.  The inferior branch was subtotally stenosed with TIMI 1 to 2 flow.  The right coronary artery had a proximal 95% stenosis and mid occlusion.  The  EF was 65% with mild inferobasilar akinesis. Patient had PCI (balloon angioplasty only) of the  subtotal branch of the marginal. I last saw him in June of 2011. Since then, the patient has dyspnea with more extreme activities but not with routine activities. It is relieved with rest. It is not associated with chest pain. There is no orthopnea, PND or pedal edema. There is no syncope or palpitations. There is no exertional chest pain.    Current Medications (verified): 1)  Ecotrin 325 Mg Tbec (Aspirin) .... Take One Daily 2)  Aricept 23 Mg Tabs (Donepezil Hcl) .... Take One Daily 3)  Advair Diskus 250-50 Mcg/dose  Misc (Fluticasone-Salmeterol) .... One Puff Twice Daily 4)  Proair Hfa 108 (90 Base) Mcg/act Aers (Albuterol Sulfate) .Marland Kitchen.. 1-2 Puffs Every 4-6 Hours As Needed For Shortness of Breath 5)  Nitrostat 0.4 Mg Subl (Nitroglycerin) .... Take One As Needed 6)  Simvastatin 40 Mg Tabs (Simvastatin) .... Take One Daily 7)  Lumigan 0.03 % Soln (Bimatoprost) .... Once A Day 8)  Amlodipine Besylate 5 Mg Tabs (Amlodipine Besylate) ....  Take One Tablet By Mouth Daily 9)  Trazodone Hcl 50 Mg Tabs (Trazodone Hcl) .... As Needed 10)  Vitamin B-12 1000 Mcg Tabs (Cyanocobalamin) .... Take 1 Tablet By Mouth Once A Day 11)  Vitamin D 1000 Unit Tabs (Cholecalciferol) .... Take 1 Tablet By Mouth Once A Day  Allergies: 1)  ! Penicillin 2)  ! * Ambien  Past History:  Past Medical History: Reviewed history from 12/09/2009 and no changes required. PERIPHERAL VASCULAR DISEASE (ICD-443.9) OSTEOARTHRITIS (ICD-715.90) HYPERTENSION (ICD-401.9) HYPERLIPIDEMIA (ICD-272.4) CORONARY ARTERY DISEASE (ICD-414.00)    -Non Stemi 3/11    -PTCA first OM 3/11 COPD (ICD-496) COLONIC POLYPS, HX OF (ICD-V12.72) Dementia    Social History: Reviewed history from 12/09/2009 and no changes required. The patient lives in [place] with his wife.  He still   works part-time (not physical work).  He has remote tobacco abuse, quitting 18 years ago.  He drinks approximately six beers per week and  denies any binge drinking.  No illicit drugs.  No herbal medications,  regular diet.  No regular exercise.   Review of Systems       no fevers or chills, productive cough, hemoptysis, dysphasia, odynophagia, melena, hematochezia, dysuria, hematuria, rash, seizure activity, orthopnea, PND, pedal edema, claudication. Remaining systems are negative.   Vital Signs:  Patient profile:   78 year old male Height:      67 inches Weight:      205 pounds BMI:     32.22 Pulse rate:   50 / minute  Pulse rhythm:   regular Resp:     20 per minute BP sitting:   127 / 73  (right arm) Cuff size:   large  Vitals Entered By: Vikki Ports (February 24, 2010 2:09 PM)  Physical Exam  General:  Well-developed well-nourished in no acute distress.  Skin is warm and dry.  HEENT is normal.  Neck is supple. No thyromegaly.  Chest is diminished breath sounds throughout Cardiovascular exam is regular rate and rhythm.  Abdominal exam nontender or distended. No masses  palpated. Extremities show no edema. neuro grossly intact    EKG  Procedure date:  02/24/2010  Findings:      Sinus bradycardia at a rate of 50. No ST changes.  Impression & Recommendations:  Problem # 1:  ANGINA DECUBITUS (ICD-413.0) Patient has had no further symptoms on amlodipine. Continue aspirin and statin. His updated medication list for this problem includes:    Ecotrin 325 Mg Tbec (Aspirin) .Marland Kitchen... Take one daily    Nitrostat 0.4 Mg Subl (Nitroglycerin) .Marland Kitchen... Take one as needed    Amlodipine Besylate 5 Mg Tabs (Amlodipine besylate) .Marland Kitchen... Take one tablet by mouth daily  Problem # 2:  PERIPHERAL VASCULAR DISEASE (ICD-443.9) Continue aspirin and statin.  Problem # 3:  HYPERTENSION (ICD-401.9) Blood pressure controlled on present medications. Will continue. His updated medication list for this problem includes:    Ecotrin 325 Mg Tbec (Aspirin) .Marland Kitchen... Take one daily    Amlodipine Besylate 5 Mg Tabs (Amlodipine besylate) .Marland Kitchen... Take one tablet by mouth daily  Problem # 4:  HYPERLIPIDEMIA (ICD-272.4) Continue statin. Lipids and liver monitored by primary care. His updated medication list for this problem includes:    Simvastatin 40 Mg Tabs (Simvastatin) .Marland Kitchen... Take one daily  Problem # 5:  CORONARY ARTERY DISEASE (ICD-414.00) Continue aspirin and statin. His updated medication list for this problem includes:    Ecotrin 325 Mg Tbec (Aspirin) .Marland Kitchen... Take one daily    Nitrostat 0.4 Mg Subl (Nitroglycerin) .Marland Kitchen... Take one as needed    Amlodipine Besylate 5 Mg Tabs (Amlodipine besylate) .Marland Kitchen... Take one tablet by mouth daily  Problem # 6:  COPD (ICD-496)  His updated medication list for this problem includes:    Advair Diskus 250-50 Mcg/dose Misc (Fluticasone-salmeterol) ..... One puff twice daily    Proair Hfa 108 (90 Base) Mcg/act Aers (Albuterol sulfate) .Marland Kitchen... 1-2 puffs every 4-6 hours as needed for shortness of breath I  Patient Instructions: 1)  Your physician recommends  that you schedule a follow-up appointment in: 6 MONTHS

## 2010-07-15 NOTE — Assessment & Plan Note (Signed)
Summary: Pulmonary OV   Primary Provider/Referring Provider:  Larina Earthly  CC:  3 month follow up.  states breathing is doing well overall but states he will have wheezing occ when out in cold, damp weather, and and occ prod cough with a small amount of light green mucus.  denies chest tightness.  No complaints. .  History of Present Illness:  77 year old, white male with chronic obstructive lung disease.   August 20, 2009 12:11 PM Doing ok since early dec/10 The pt notes occ sinus issues.  The dyspnea is at baseline.    There have been no flare ups of mucous since back on advair. Pt denies any increase in rescue therapy over baseline, denies waking up needing it or having any early am or nocturnal exacerbations of coughing/wheezing/or dyspnea. Pt denies any significant sore throat, nasal congestion or excess secretions, fever, chills, sweats, unintended weight loss, pleurtic or exertional chest pain, orthopnea PND, or leg swelling    August 20, 2009 12:11 PM Doing ok since early dec/10 occ sinus issues.  dyspnea is at baseline.    no flare ups of mucous since back on advair.   Preventive Screening-Counseling & Management  Alcohol-Tobacco     Smoking Status: quit > 6 months  Current Medications (verified): 1)  Bayer Low Strength 81 Mg  Tbec (Aspirin) .... One By Mouth Once Daily 2)  Aricept 10 Mg  Tabs (Donepezil Hcl) .... One By Mouth Once Daily 3)  Advair Diskus 250-50 Mcg/dose  Misc (Fluticasone-Salmeterol) .... One Puff Twice Daily 4)  Ventolin Hfa 108 (90 Base) Mcg/act  Aers (Albuterol Sulfate) .Marland Kitchen.. 1-2 Puffs Every 4-6 Hours As Needed 5)  Vitamin D 1000 Unit Tabs (Cholecalciferol) .... Once Daily 6)  Vitamin B-12 1000 Mcg Tabs (Cyanocobalamin) .... Two Times A Day 7)  Nasonex 50 Mcg/act  Susp (Mometasone Furoate) .... Two Puffs Each Nostril Daily  Allergies (verified): 1)  ! Penicillin  Past History:  Past medical, surgical, family and social histories (including risk  factors) reviewed, and no changes noted (except as noted below).  Past Medical History: Reviewed history from 11/07/2007 and no changes required. ALLERGIC RHINITIS (ICD-477.9) PERIPHERAL VASCULAR DISEASE (ICD-443.9) OSTEOARTHRITIS (ICD-715.90) HYPERTENSION (ICD-401.9) HYPERLIPIDEMIA (ICD-272.4) CORONARY ARTERY DISEASE (ICD-414.00) COPD (ICD-496) COLONIC POLYPS, HX OF (ICD-V12.72)    Family History: Reviewed history from 10/03/2007 and no changes required. non contrib  Social History: Reviewed history from 06/27/2007 and no changes required. Patient states former smoker.   Review of Systems       The patient complains of nasal congestion/difficulty breathing through nose.  The patient denies shortness of breath with activity, shortness of breath at rest, productive cough, non-productive cough, coughing up blood, chest pain, irregular heartbeats, acid heartburn, indigestion, loss of appetite, weight change, abdominal pain, difficulty swallowing, sore throat, tooth/dental problems, headaches, sneezing, itching, ear ache, anxiety, depression, hand/feet swelling, joint stiffness or pain, rash, change in color of mucus, and fever.    Vital Signs:  Patient profile:   77 year old male Height:      67 inches Weight:      206 pounds BMI:     32.38 O2 Sat:      97 % on Room air Temp:     97.7 degrees F oral Pulse rate:   66 / minute BP sitting:   116 / 68  (right arm) Cuff size:   regular  Vitals Entered By: Gweneth Dimitri RN (August 20, 2009 12:06 PM)  O2 Flow:  Room air CC:  3 month follow up.  states breathing is doing well overall but states he will have wheezing occ when out in cold, damp weather, and occ prod cough with a small amount of light green mucus.  denies chest tightness.  No complaints.  Comments Medications reviewed with patient Gweneth Dimitri RN  August 20, 2009 12:04 PM Daytime contact number verified with patient.    Physical Exam  Additional Exam:  GENERAL:   A/Ox3; pleasant & cooperative.NAD HEENT:  [place]/AT, EOM-wnl, PERRLA, EACs-clear, TMs-wnl, NOSE-bilateral nasal purulencer, THROAT-clear & wnl. NECK:  Supple w/ fair ROM; no JVD; normal carotid impulses w/o bruits; no thyromegaly or nodules palpated; no lymphadenopathy. CHEST:  distant BS, improved airflow HEART:  RRR, no m/r/g  heard ABDOMEN:  Soft & nt; nml bowel sounds; no organomegaly or masses detected. EXT: Warm bilat,  no calf pain, edema, clubbing, pulses intact Skin: no rash/lesion    Impression & Recommendations:  Problem # 1:  COPD (ICD-496) Assessment Improved Improved COPD with no recent flares on advair 250 plan cont advair 250 as needed ventolin  Complete Medication List: 1)  Bayer Low Strength 81 Mg Tbec (Aspirin) .... One by mouth once daily 2)  Aricept 10 Mg Tabs (Donepezil hcl) .... One by mouth once daily 3)  Advair Diskus 250-50 Mcg/dose Misc (Fluticasone-salmeterol) .... One puff twice daily 4)  Ventolin Hfa 108 (90 Base) Mcg/act Aers (Albuterol sulfate) .Marland Kitchen.. 1-2 puffs every 4-6 hours as needed 5)  Vitamin D 1000 Unit Tabs (Cholecalciferol) .... Once daily 6)  Vitamin B-12 1000 Mcg Tabs (Cyanocobalamin) .... Two times a day 7)  Nasonex 50 Mcg/act Susp (Mometasone furoate) .... Two puffs each nostril daily  Other Orders: Est. Patient Level III (56433)  Patient Instructions: 1)  No change in medications 2)  Return 4 months

## 2010-07-15 NOTE — Op Note (Signed)
Summary: Ernest Mallick Byers,MD   Imported By: Lester Saluda 07/07/2009 12:43:53  _____________________________________________________________________  External Attachment:    Type:   Image     Comment:   External Document

## 2010-07-15 NOTE — Assessment & Plan Note (Signed)
Summary: [facility]   Visit Type:  3 months follow up Primary Provider:  Larina Earthly  CC:  Chest pain right now and Sob.  History of Present Illness: 77 year old male with past medical history of coronary artery disease for followup. Patient was admitted to [facility] in March of 2011 following a non-ST elevation myocardial infarction. Cardiac catheterization was performed at that time and revealed a 25% LM.  The LAD  had heavy calcification with long proximal 50% stenosis.  There was mid 50% stenosis.  There was distal 60% stenosis.  First diagonal was moderate sized with luminal irregularities.  The circumflex in the AV  groove had proximal 30% stenosis.  There was a mid obtuse marginal, which was a moderate-sized branching vessel.  The inferior branch was subtotally stenosed with TIMI 1 to 2 flow.  The right coronary artery had a proximal 95% stenosis and mid occlusion.  The  EF was 65% with mild inferobasilar akinesis. Patient had PCI (balloon angioplasty only) of the  subtotal branch of the marginal. He was seen in followup by one of our physician assistants in April of 2011. Since then he has an ache in his left upper chest with more extreme activities but not with routine activities. It is relieved with rest. He has not had these symptoms otherwise. He denies dyspnea on exertion, orthopnea, PND, pedal edema, palpitations or syncope. There is no claudication.  Current Medications (verified): 1)  Ecotrin 325 Mg Tbec (Aspirin) .... Take One Daily 2)  Aricept 23 Mg Tabs (Donepezil Hcl) .... Take One Daily 3)  Advair Diskus 250-50 Mcg/dose  Misc (Fluticasone-Salmeterol) .... One Puff Twice Daily 4)  Proair Hfa 108 (90 Base) Mcg/act Aers (Albuterol Sulfate) .Marland Kitchen.. 1-2 Puffs Every 4-6 Hours As Needed For Shortness of Breath 5)  Nasonex 50 Mcg/act  Susp (Mometasone Furoate) .... Two Puffs Each Nostril Daily 6)  Nitrostat 0.4 Mg Subl (Nitroglycerin) .... Take One As Needed 7)   Simvastatin 40 Mg Tabs (Simvastatin) .... Take One Daily 8)  Lumigan 0.03 % Soln (Bimatoprost) .... Once A Day  Allergies: 1)  ! Penicillin 2)  ! * Ambien  Past History:  Past Medical History: PERIPHERAL VASCULAR DISEASE (ICD-443.9) OSTEOARTHRITIS (ICD-715.90) HYPERTENSION (ICD-401.9) HYPERLIPIDEMIA (ICD-272.4) CORONARY ARTERY DISEASE (ICD-414.00)    -Non Stemi 3/11    -PTCA first OM 3/11 COPD (ICD-496) COLONIC POLYPS, HX OF (ICD-V12.72) Dementia    Social History: Reviewed history from 06/27/2007 and no changes required. The patient lives in [place] with his wife.  He still   works part-time (not physical work).  He has remote tobacco abuse, quitting 18 years ago.  He drinks approximately six beers per week and  denies any binge drinking.  No illicit drugs.  No herbal medications,  regular diet.  No regular exercise.   Review of Systems       no fevers or chills, productive cough, hemoptysis, dysphasia, odynophagia, melena, hematochezia, dysuria, hematuria, rash, seizure activity, orthopnea, PND, pedal edema, claudication. Remaining systems are negative.   Vital Signs:  Patient profile:   77 year old male Height:      67 inches Weight:      198 pounds BMI:     31.12 Pulse rate:   57 / minute Pulse rhythm:   regular Resp:     18 per minute BP sitting:   122 / 69  (right arm) Cuff size:   large  Vitals Entered By: Vikki Ports (December 09, 2009 2:28 PM)  Physical Exam  General:  Well-developed well-nourished in no acute distress.  Skin is warm and dry.  HEENT is normal.  Neck is supple. No thyromegaly.  Chest decreased breath sounds with mild wheeze with forced expiration. Cardiovascular exam is regular rate and rhythm.  Abdominal exam nontender or distended. No masses palpated. Extremities show no edema. neuro grossly intact    EKG  Procedure date:  12/09/2009  Findings:      Sinus rhythm at a rate of 57. Axis normal. Nonspecific ST changes.  Impression  & Recommendations:  Problem # 1:  CORONARY ARTERY DISEASE (ICD-414.00) Continue aspirin and statin. The following medications were removed from the medication list:    Plavix 75 Mg Tabs (Clopidogrel bisulfate) .Marland Kitchen... Take one daily His updated medication list for this problem includes:    Ecotrin 325 Mg Tbec (Aspirin) .Marland Kitchen... Take one daily    Nitrostat 0.4 Mg Subl (Nitroglycerin) .Marland Kitchen... Take one as needed    Amlodipine Besylate 5 Mg Tabs (Amlodipine besylate) .Marland Kitchen... Take one tablet by mouth daily  Problem # 2:  HYPERLIPIDEMIA (ICD-272.4) Recent LDL not at goal. I offered changing Zocor to Crestor but he declined due to the cost. He understands he will not be at goal. His updated medication list for this problem includes:    Simvastatin 40 Mg Tabs (Simvastatin) .Marland Kitchen... Take one daily  His updated medication list for this problem includes:    Simvastatin 40 Mg Tabs (Simvastatin) .Marland Kitchen... Take one daily  Problem # 3:  COPD (ICD-496)  His updated medication list for this problem includes:    Advair Diskus 250-50 Mcg/dose Misc (Fluticasone-salmeterol) ..... One puff twice daily    Proair Hfa 108 (90 Base) Mcg/act Aers (Albuterol sulfate) .Marland Kitchen... 1-2 puffs every 4-6 hours as needed for shortness of breath  His updated medication list for this problem includes:    Advair Diskus 250-50 Mcg/dose Misc (Fluticasone-salmeterol) ..... One puff twice daily    Proair Hfa 108 (90 Base) Mcg/act Aers (Albuterol sulfate) .Marland Kitchen... 1-2 puffs every 4-6 hours as needed for shortness of breath  Problem # 4:  PERIPHERAL VASCULAR DISEASE (ICD-443.9) Continue aspirin and statin.  Problem # 5:  HYPERTENSION (ICD-401.9) Blood pressure controlled on no medications. His updated medication list for this problem includes:    Ecotrin 325 Mg Tbec (Aspirin) .Marland Kitchen... Take one daily    Amlodipine Besylate 5 Mg Tabs (Amlodipine besylate) .Marland Kitchen... Take one tablet by mouth daily  His updated medication list for this problem includes:     Ecotrin 325 Mg Tbec (Aspirin) .Marland Kitchen... Take one daily  Problem # 6:  ANGINA DECUBITUS (ICD-413.0) Patient's chest pain sounds to be angina with more vigorous activities. It is not unstable and not particularly bothersome. I will add Norvasc 5 mg p.o. daily. I will not add a beta blocker given his resting heart rate in the 50s, history of COPD and expiratory wheeze on examination. If his symptoms progress he may require repeat cardiac catheterization. The following medications were removed from the medication list:    Plavix 75 Mg Tabs (Clopidogrel bisulfate) .Marland Kitchen... Take one daily His updated medication list for this problem includes:    Ecotrin 325 Mg Tbec (Aspirin) .Marland Kitchen... Take one daily    Nitrostat 0.4 Mg Subl (Nitroglycerin) .Marland Kitchen... Take one as needed    Amlodipine Besylate 5 Mg Tabs (Amlodipine besylate) .Marland Kitchen... Take one tablet by mouth daily  Patient Instructions: 1)  Your physician recommends that you schedule a follow-up appointment in: 3 MONTHS 2)  Your physician has recommended you  make the following change in your medication: START AMLODIPINE 5MG  ONCE DAILY Prescriptions: AMLODIPINE BESYLATE 5 MG TABS (AMLODIPINE BESYLATE) Take one tablet by mouth daily  #30 x 12   Entered by:   Deliah Goody, RN   Authorized by:   Ferman Hamming, MD, [facility]   Signed by:   Deliah Goody, RN on 12/09/2009   Method used:   Electronically to        [facility]  [address] [phone]* (retail)       [address]       [place], [place]  [address]       Ph: [phone]       Fax: [phone]   RxID:   [id number]

## 2010-07-15 NOTE — Assessment & Plan Note (Signed)
Summary: Pulmonary OV   Visit Type:  Follow-up Primary Provider/Referring Provider:  Larina Earthly  CC:  Pt here for follow up. Pt c/o having increased SOB, chest tightness, and and "tickle in throat" x 3 weeks ago. Same has improved over the last few days. [name] [name]  History of Present Illness:  77 year old, white male with chronic obstructive lung disease.   August 20, 2009 12:11 PM Doing ok since early dec/10 The pt notes occ sinus issues.  The dyspnea is at baseline.    There have been no flare ups of mucous since back on advair. Pt denies any increase in rescue therapy over baseline, denies waking up needing it or having any early am or nocturnal exacerbations of coughing/wheezing/or dyspnea. Pt denies any significant sore throat, nasal congestion or excess secretions, fever, chills, sweats, unintended weight loss, pleurtic or exertional chest pain, orthopnea PND, or leg swelling   Oct 22, 2009 10:58 AM Pt had AMI  3/21- 3/23.   had adverse reaction to [place]. Had difficulty with dyspnea while in hospital.  PTCA of first OM.  had NON Stemi  no further cp since hosp stay,  no cough now,  dyspnea is ok   August 20, 2009 12:11 PM Doing ok since early dec/10 occ sinus issues.  dyspnea is at baseline.    no flare ups of mucous since back on advair.   Preventive Screening-Counseling & Management  Alcohol-Tobacco     Smoking Status: quit > 6 months  Current Medications (verified): 1)  Ecotrin 325 Mg Tbec (Aspirin) .... Take One Daily 2)  Aricept 23 Mg Tabs (Donepezil Hcl) .... Take One Daily 3)  Advair Diskus 250-50 Mcg/dose  Misc (Fluticasone-Salmeterol) .... One Puff Twice Daily 4)  Ventolin Hfa 108 (90 Base) Mcg/act  Aers (Albuterol Sulfate) .[name] [name].. 1-2 Puffs Every 4-6 Hours As Needed 5)  Vitamin D 1000 Unit Tabs (Cholecalciferol) .... Once Daily 6)  Vitamin B-12 1000 Mcg Tabs (Cyanocobalamin) .... Two Times A Day 7)  Nasonex 50 Mcg/act  Susp (Mometasone Furoate) .... Two Puffs Each  Nostril Daily 8)  Plavix 75 Mg Tabs (Clopidogrel Bisulfate) .... Take One Daily 9)  Nitrostat 0.4 Mg Subl (Nitroglycerin) .... Take One As Needed 10)  Simvastatin 40 Mg Tabs (Simvastatin) .... Take One Daily  Allergies (verified): 1)  ! Penicillin 2)  ! * Ambien  Past History:  Past medical, surgical, family and social histories (including risk factors) reviewed, and no changes noted (except as noted below).  Past Medical History: ALLERGIC RHINITIS (ICD-477.9) PERIPHERAL VASCULAR DISEASE (ICD-443.9) OSTEOARTHRITIS (ICD-715.90) HYPERTENSION (ICD-401.9) HYPERLIPIDEMIA (ICD-272.4) CORONARY ARTERY DISEASE (ICD-414.00)    -Non Stemi 3/11    -PTCA first OM 3/11 COPD (ICD-496) COLONIC POLYPS, HX OF (ICD-V12.72)    Past Surgical History:  PVD S/P stenting of left common iliac and external iliac Cath with PTCA first OM 3/11  Family History: Reviewed history from 10/03/2007 and no changes required. non contrib  Social History: Reviewed history from 06/27/2007 and no changes required. Patient states former smoker.   Review of Systems       The patient complains of shortness of breath with activity.  The patient denies shortness of breath at rest, productive cough, non-productive cough, coughing up blood, chest pain, irregular heartbeats, acid heartburn, indigestion, loss of appetite, weight change, abdominal pain, difficulty swallowing, sore throat, tooth/dental problems, headaches, nasal congestion/difficulty breathing through nose, sneezing, itching, ear ache, anxiety, depression, hand/feet swelling, joint stiffness or pain, rash, change in color of mucus, and fever.  Vital Signs:  Patient profile:   77 year old male Height:      67 inches Weight:      206 pounds O2 Sat:      96 % on Room air Temp:     97.9 degrees F oral Pulse rate:   56 / minute BP sitting:   144 / 86  (left arm) Cuff size:   regular  Vitals Entered By: Zackery Barefoot CMA (Oct 22, 2009 10:45  AM)  O2 Flow:  Room air CC: Pt here for follow up. Pt c/o having increased SOB, chest tightness, and "tickle in throat" x 3 weeks ago. Same has improved over the last few days.  Comments Medications reviewed with patient Verified contact number and pharmacy with patient Zackery Barefoot CMA  Oct 22, 2009 10:45 AM    Physical Exam  Additional Exam:  GENERAL:  A/Ox3; pleasant & cooperative.NAD HEENT:  [place]/AT, EOM-wnl, PERRLA, EACs-clear, TMs-wnl, NOSE-bilateral nasal purulencer, THROAT-clear & wnl. NECK:  Supple w/ fair ROM; no JVD; normal carotid impulses w/o bruits; no thyromegaly or nodules palpated; no lymphadenopathy. CHEST:  distant BS, improved airflow HEART:  RRR, no m/r/g  heard ABDOMEN:  Soft & nt; nml bowel sounds; no organomegaly or masses detected. EXT: Warm bilat,  no calf pain, edema, clubbing, pulses intact Skin: no rash/lesion    Impression & Recommendations:  Problem # 1:  COPD (ICD-496) Assessment Unchanged Stable COPD  [place] stage III plan cont advair   Complete Medication List: 1)  Ecotrin 325 Mg Tbec (Aspirin) .... Take one daily 2)  Aricept 23 Mg Tabs (Donepezil hcl) .... Take one daily 3)  Advair Diskus 250-50 Mcg/dose Misc (Fluticasone-salmeterol) .... One puff twice daily 4)  Proair Hfa 108 (90 Base) Mcg/act Aers (Albuterol sulfate) .[name] [name].. 1-2 puffs every 4-6 hours as needed for shortness of breath 5)  Vitamin D 1000 Unit Tabs (Cholecalciferol) .... Once daily 6)  Vitamin B-12 1000 Mcg Tabs (Cyanocobalamin) .... Two times a day 7)  Nasonex 50 Mcg/act Susp (Mometasone furoate) .... Two puffs each nostril daily 8)  Plavix 75 Mg Tabs (Clopidogrel bisulfate) .... Take one daily 9)  Nitrostat 0.4 Mg Subl (Nitroglycerin) .... Take one as needed 10)  Simvastatin 40 Mg Tabs (Simvastatin) .... Take one daily  Patient Instructions: 1)  No change in medications 2)  Return 3 months  Prescriptions: NASONEX 50 MCG/ACT  SUSP (MOMETASONE FUROATE) Two puffs each  nostril daily  #1 x 6   Entered and Authorized by:   Storm Frisk MD   Signed by:   Storm Frisk MD on 10/22/2009   Method used:   Electronically to        [facility]  [address] [phone]* (retail)       [address]       [place], [place]  [address]       Ph: [phone]       Fax: [phone]   RxID:   [id number] VENTOLIN HFA 108 (90 BASE) MCG/ACT  AERS (ALBUTEROL SULFATE) 1-2 puffs every 4-6 hours as needed  #1 x 6   Entered and Authorized by:   Storm Frisk MD   Signed by:   Storm Frisk MD on 10/22/2009   Method used:   Electronically to        [facility]  [address] [phone]* (retail)       [address]       [place], [place]  [address]  Ph: [phone]       Fax: [phone]   RxID:   [id number] ADVAIR DISKUS 250-50 MCG/DOSE  MISC (FLUTICASONE-SALMETEROL) One puff twice daily  #1 x 6   Entered and Authorized by:   Storm Frisk MD   Signed by:   Storm Frisk MD on 10/22/2009   Method used:   Electronically to        [facility]  [id number]* (retail)       [address]       [place], [place]  [address]       Ph: [phone]       Fax: [phone]   RxID:   [id number]   Prevention & Chronic Care Immunizations   Influenza vaccine: Fluvax 3+  (03/17/2009)    Tetanus booster: Not documented    Pneumococcal vaccine: Pneumovax (Medicare)  (03/27/2009)    H. zoster vaccine: Not documented  Colorectal Screening   Hemoccult: Not documented    Colonoscopy: Not documented  Other Screening   PSA: Not documented   Smoking status: quit > 6 months  (10/22/2009)  Lipids   Total Cholesterol: 154  (10/20/2009)   LDL: 82  (10/20/2009)   LDL Direct: Not documented   HDL: 38  (10/20/2009)   Triglycerides: 172  (10/20/2009)    SGOT (AST): 21  (10/20/2009)   SGPT (ALT): 15  (10/20/2009)   Alkaline phosphatase: 66  (10/20/2009)   Total bilirubin: 0.9  (10/20/2009)  Hypertension   Last Blood Pressure: 144 / 86  (10/22/2009)   Serum creatinine:  Not documented   Serum potassium Not documented  Self-Management Support :    Hypertension self-management support: Not documented    Lipid self-management support: Not documented

## 2010-07-15 NOTE — Miscellaneous (Signed)
Summary: CT Sinus  Clinical Lists Changes  Observations: Added new observation of CT OF SINUS: Findings: There are extensive changes of sinusitis involving all of the paranasal sinuses.  The frontal sinuses are partially aerated. The ethmoids are almost completely opacified as are the maxillary sinuses and the sphenoid sinuses bilaterally.  There are no definite air-fluid levels.  No bony destructive changes are noted.   IMPRESSION: Advanced chronic pansinusitis.   (07/12/2009 16:40)      CT of Sinus  Procedure date:  07/12/2009  Findings:      Findings: There are extensive changes of sinusitis involving all of the paranasal sinuses.  The frontal sinuses are partially aerated. The ethmoids are almost completely opacified as are the maxillary sinuses and the sphenoid sinuses bilaterally.  There are no definite air-fluid levels.  No bony destructive changes are noted.   IMPRESSION: Advanced chronic pansinusitis.

## 2010-07-15 NOTE — Letter (Signed)
Summary: [facility]  [facility]   Imported By: Sherian Rein 07/28/2009 10:58:59  _____________________________________________________________________  External Attachment:    Type:   Image     Comment:   External Document

## 2010-08-25 ENCOUNTER — Ambulatory Visit (INDEPENDENT_AMBULATORY_CARE_PROVIDER_SITE_OTHER): Payer: Self-pay | Admitting: Cardiology

## 2010-08-25 ENCOUNTER — Encounter: Payer: Self-pay | Admitting: Cardiology

## 2010-08-25 DIAGNOSIS — I2510 Atherosclerotic heart disease of native coronary artery without angina pectoris: Secondary | ICD-10-CM

## 2010-08-25 DIAGNOSIS — I10 Essential (primary) hypertension: Secondary | ICD-10-CM

## 2010-08-31 NOTE — Assessment & Plan Note (Signed)
Summary: f63m/dm/tt   Primary Provider:  Larina Earthly   History of Present Illness: 77 year old male with past medical history of coronary artery disease for followup. Patient was admitted to [facility] in March of 2011 following a non-ST elevation myocardial infarction. Cardiac catheterization was performed at that time and revealed a 25% LM.  The LAD  had heavy calcification with long proximal 50% stenosis.  There was mid 50% stenosis.  There was distal 60% stenosis.  First diagonal was moderate sized with luminal irregularities.  The circumflex in the AV  groove had proximal 30% stenosis.  There was a mid obtuse marginal, which was a moderate-sized branching vessel.  The inferior branch was subtotally stenosed with TIMI 1 to 2 flow.  The right coronary artery had a proximal 95% stenosis and mid occlusion.  The  EF was 65% with mild inferobasilar akinesis. Patient had PCI (balloon angioplasty only) of the  subtotal branch of the marginal. I last saw him in Sept of 2011. Since then, the patient has dyspnea with more extreme activities but not with routine activities. It is relieved with rest. It is not associated with chest pain. There is no orthopnea, PND or pedal edema. There is no syncope or palpitations. There is no exertional chest pain.  Current Medications (verified): 1)  Ecotrin 325 Mg Tbec (Aspirin) .... Take One Daily 2)  Aricept 23 Mg Tabs (Donepezil Hcl) .... Take One Daily 3)  Advair Diskus 250-50 Mcg/dose  Misc (Fluticasone-Salmeterol) .... One Puff Twice Daily 4)  Proair Hfa 108 (90 Base) Mcg/act Aers (Albuterol Sulfate) .Marland Kitchen.. 1-2 Puffs Every 4-6 Hours As Needed For Shortness of Breath 5)  Nitrostat 0.4 Mg Subl (Nitroglycerin) .... Take One As Needed 6)  Simvastatin 40 Mg Tabs (Simvastatin) .... Take One Daily 7)  Lumigan 0.03 % Soln (Bimatoprost) .... Once A Day 8)  Amlodipine Besylate 5 Mg Tabs (Amlodipine Besylate) .... Take One Tablet By Mouth Daily 9)  Trazodone Hcl 50 Mg  Tabs (Trazodone Hcl) .... As Needed 10)  Vitamin B-12 1000 Mcg Tabs (Cyanocobalamin) .... Take 1 Tablet By Mouth Once A Day 11)  Vitamin D 1000 Unit Tabs (Cholecalciferol) .... Take 1 Tablet By Mouth Once A Day  Allergies (verified): 1)  ! Penicillin 2)  ! * Ambien  Past History:  Past Medical History: Reviewed history from 12/09/2009 and no changes required. PERIPHERAL VASCULAR DISEASE (ICD-443.9) OSTEOARTHRITIS (ICD-715.90) HYPERTENSION (ICD-401.9) HYPERLIPIDEMIA (ICD-272.4) CORONARY ARTERY DISEASE (ICD-414.00)    -Non Stemi 3/11    -PTCA first OM 3/11 COPD (ICD-496) COLONIC POLYPS, HX OF (ICD-V12.72) Dementia    Past Surgical History: Reviewed history from 10/22/2009 and no changes required.  PVD S/P stenting of left common iliac and external iliac Cath with PTCA first OM 3/11  Social History: Reviewed history from 12/09/2009 and no changes required. The patient lives in [place] with his wife.  He still   works part-time (not physical work).  He has remote tobacco abuse, quitting 18 years ago.  He drinks approximately six beers per week and  denies any binge drinking.  No illicit drugs.  No herbal medications,  regular diet.  No regular exercise.   Review of Systems       no fevers or chills, productive cough, hemoptysis, dysphasia, odynophagia, melena, hematochezia, dysuria, hematuria, rash, seizure activity, orthopnea, PND, pedal edema, claudication. Remaining systems are negative.   Vital Signs:  Patient profile:   77 year old male Height:      67 inches Weight:  207 pounds Pulse rate:   59 / minute Pulse rhythm:   regular BP sitting:   115 / 67  (left arm) Cuff size:   large  Vitals Entered By: Vikki Ports (August 25, 2010 2:22 PM)  Physical Exam  General:  Well-developed well-nourished in no acute distress.  Skin is warm and dry.  HEENT is normal.  Neck is supple. No thyromegaly.  Chest - mild expiratory wheeze. Cardiovascular exam is regular rate  and rhythm.  Abdominal exam nontender or distended. No masses palpated. Extremities show no edema. neuro grossly intact    EKG  Procedure date:  08/25/2010  Findings:      Sinus bradycardia at a rate of 54. No ST changes.  Impression & Recommendations:  Problem # 1:  PERIPHERAL VASCULAR DISEASE (ICD-443.9) Continue aspirin and statin.  Problem # 2:  HYPERTENSION (ICD-401.9)  Blood pressure controlled. Continue present medications. His updated medication list for this problem includes:    Ecotrin 325 Mg Tbec (Aspirin) .Marland Kitchen... Take one daily    Amlodipine Besylate 5 Mg Tabs (Amlodipine besylate) .Marland Kitchen... Take one tablet by mouth daily  His updated medication list for this problem includes:    Ecotrin 325 Mg Tbec (Aspirin) .Marland Kitchen... Take one daily    Amlodipine Besylate 5 Mg Tabs (Amlodipine besylate) .Marland Kitchen... Take one tablet by mouth daily  Problem # 3:  HYPERLIPIDEMIA (ICD-272.4) Given use of amlodipine I will discontinue Zocor and begin Pravachol 40 mg p.o. daily. Check lipids and liver in 6 weeks. His updated medication list for this problem includes:    Pravastatin Sodium 40 Mg Tabs (Pravastatin sodium) .Marland Kitchen... Take one tablet by mouth daily at bedtime  Problem # 4:  CORONARY ARTERY DISEASE (ICD-414.00) Continue aspirin and statin. His updated medication list for this problem includes:    Ecotrin 325 Mg Tbec (Aspirin) .Marland Kitchen... Take one daily    Nitrostat 0.4 Mg Subl (Nitroglycerin) .Marland Kitchen... Take one as needed    Amlodipine Besylate 5 Mg Tabs (Amlodipine besylate) .Marland Kitchen... Take one tablet by mouth daily  Problem # 5:  COPD (ICD-496)  His updated medication list for this problem includes:    Advair Diskus 250-50 Mcg/dose Misc (Fluticasone-salmeterol) ..... One puff twice daily    Proair Hfa 108 (90 Base) Mcg/act Aers (Albuterol sulfate) .Marland Kitchen... 1-2 puffs every 4-6 hours as needed for shortness of breath  Other Orders: T-Hepatic Function [id number]) T-Lipid Profile ([id number])  Patient  Instructions: 1)  Your physician has recommended you make the following change in your medication:  2)  Your physician wants you to follow-up in:   You will receive a reminder letter in the mail two months in advance. If you don't receive a letter, please call our office to schedule the follow-up appointment. 3)  Your physician recommends that you return for lab work in: Prescriptions: PRAVASTATIN SODIUM 40 MG TABS (PRAVASTATIN SODIUM) Take one tablet by mouth daily at bedtime  #30 x 12   Entered by:   Deliah Goody, RN   Authorized by:   Ferman Hamming, MD, [facility]   Signed by:   Deliah Goody, RN on 08/25/2010   Method used:   Electronically to        [facility] [phone]* (retail)       [address]       [place], [place]  [address]       Ph: [phone]       Fax: [phone]   RxID:   [id number]

## 2010-09-05 LAB — HEPARIN LEVEL (UNFRACTIONATED): Heparin Unfractionated: 0.3 IU/mL (ref 0.30–0.70)

## 2010-09-05 LAB — LIPID PANEL
Cholesterol: 189 mg/dL (ref 0–200)
HDL: 27 mg/dL — ABNORMAL LOW (ref >39)
LDL Cholesterol: 122 mg/dL — ABNORMAL HIGH (ref 0–99)
Total CHOL/HDL Ratio: 7 RATIO
VLDL: 40 mg/dL (ref 0–40)

## 2010-09-05 LAB — CBC
HCT: 35 % — ABNORMAL LOW (ref 39.0–52.0)
Platelets: 108 10*3/uL — ABNORMAL LOW (ref 150–400)
Platelets: 115 10*3/uL — ABNORMAL LOW (ref 150–400)
RDW: 12.7 % (ref 11.5–15.5)
RDW: 13.1 % (ref 11.5–15.5)
WBC: 6.3 10*3/uL (ref 4.0–10.5)
WBC: 6.7 10*3/uL (ref 4.0–10.5)

## 2010-09-05 LAB — COMPREHENSIVE METABOLIC PANEL
AST: 42 U/L — ABNORMAL HIGH (ref 0–37)
Albumin: 3.1 g/dL — ABNORMAL LOW (ref 3.5–5.2)
Alkaline Phosphatase: 56 U/L (ref 39–117)
BUN: 12 mg/dL (ref 6–23)
Chloride: 103 mEq/L (ref 96–112)
Creatinine, Ser: 1.19 mg/dL (ref 0.4–1.5)
GFR calc Af Amer: >60 mL/min (ref >60)
Potassium: 3.9 mEq/L (ref 3.5–5.1)
Total Protein: 6 g/dL (ref 6.0–8.3)

## 2010-09-05 LAB — CARDIAC PANEL(CRET KIN+CKTOT+MB+TROPI)
CK, MB: 32.5 ng/mL (ref 0.3–4.0)
Total CK: 488 U/L — ABNORMAL HIGH (ref 7–232)
Troponin I: 2.94 ng/mL (ref 0.00–0.06)

## 2010-09-05 LAB — BASIC METABOLIC PANEL
BUN: 8 mg/dL (ref 6–23)
Calcium: 8.9 mg/dL (ref 8.4–10.5)
GFR calc non Af Amer: >60 mL/min (ref >60)
Glucose, Bld: 152 mg/dL — ABNORMAL HIGH (ref 70–99)

## 2010-09-06 LAB — DIFFERENTIAL
Eosinophils Relative: 3 % (ref 0–5)
Lymphocytes Relative: 24 % (ref 12–46)
Lymphs Abs: 1.6 10*3/uL (ref 0.7–4.0)
Monocytes Relative: 6 % (ref 3–12)

## 2010-09-06 LAB — PROTIME-INR
INR: 1.03 (ref 0.00–1.49)
Prothrombin Time: 13.4 s (ref 11.6–15.2)

## 2010-09-06 LAB — CBC
HCT: 41.5 % (ref 39.0–52.0)
Hemoglobin: 14 g/dL (ref 13.0–17.0)
Platelets: 172 10*3/uL (ref 150–400)
RBC: 4.65 MIL/uL (ref 4.22–5.81)
WBC: 6.6 10*3/uL (ref 4.0–10.5)

## 2010-09-06 LAB — POCT CARDIAC MARKERS

## 2010-09-06 LAB — BASIC METABOLIC PANEL
GFR calc Af Amer: >60 mL/min (ref >60)
GFR calc non Af Amer: 59 mL/min — ABNORMAL LOW (ref >60)
Potassium: 4.4 mEq/L (ref 3.5–5.1)
Sodium: 137 mEq/L (ref 135–145)

## 2010-09-06 LAB — CK TOTAL AND CKMB (NOT AT ARMC)
CK, MB: 16.9 ng/mL (ref 0.3–4.0)
Relative Index: 4.7 — ABNORMAL HIGH (ref 0.0–2.5)
Total CK: 359 U/L — ABNORMAL HIGH (ref 7–232)

## 2010-09-06 LAB — TROPONIN I

## 2010-10-13 ENCOUNTER — Encounter: Payer: Self-pay | Admitting: Cardiology

## 2010-10-18 ENCOUNTER — Encounter: Payer: Self-pay | Admitting: *Deleted

## 2010-10-26 NOTE — Consult Note (Signed)
[name] [name], [name] [name]             ACCOUNT NO.:  [id number]   MEDICAL RECORD NO.:  [id number]          PATIENT TYPE:  INP   LOCATION:  4705                         FACILITY:  [facility]   PHYSICIAN:  Alvy Beal, MD    DATE OF BIRTH:  06/08/1932   DATE OF CONSULTATION:  DATE OF DISCHARGE:                                 CONSULTATION   REQUESTING PHYSICIAN:  Larina Earthly, M.D.   REASON FOR CONSULTATION:  Low back pain, question L1 fracture.   HISTORY:  [name] [name] is a very pleasant 77 year old gentleman who has been  dealing with on again off again back pain for several years now.  Over  the last 3 months or so, he has noted significant increase in his pain.  He cannot recall any specific injury, trauma, or event to account for  the change in his symptoms, but he notes that it is quite severe.  He  describes a dull, throbbing, constant lumbar spine pain that does not  radiate into the lower extremities at all.  He states it is difficult to  get out of the seated position.  He cannot find any comfort when lying  down and it causes increased pain with ambulation.  At present, he was  admitted with syncope episode.  He has got a history of COPD and UTI as  well as a chronic low back pain.   As a result of the x-rays that demonstrated a questionable L1 fracture,  an orthopedic spine consultation was requested.   His past medical, surgical, family, and social history is outlined very  clearly in the admission H and P.  I have reviewed that.  Please refer  to it for specifics that is dated February 10, 2008.  I have reviewed that  with the patient.  He indicates that it is accurate.   Medications and allergies are also listed there.   PHYSICAL EXAMINATION:  He is currently in bed complaining of significant  low back pain.  He is alert and oriented x3.  No visual or hearing  deficits.  Cranial nerves II-XII were tested, they are intact.  The  tongue is midline.  It is not deviate.  No facial  asymmetry.  No hearing  or visual deficits.  No shortness of breath or chest pain at present.  Abdomen is soft and nontender.  He has 5/5 motor strength in the lower  extremity.  No hip, knee, or ankle pain with isolated joint range of  motion.  Negative nerve root tension signs.  Diminished but palpable  dorsalis pedis, posterior tibialis pulses bilaterally.  He has  significant back pain with palpation and any attempts at range of motion  testing.   X-RAYS:  X-rays demonstrate a multilevel significant degenerative disk  disease 2-3, 3-4, 4-5, 5-1 with significant facet collapse and most  likely spinal stenosis.  He also has a slight anterior wedge at L1, age  of fracture indeterminate.   At this point in time, the patient has size and symptoms more suggestive  of significant degenerative disk disease and diskogenic back pain, then  a symptomatic L1  compression fracture.  At this point, I think the best  course of action is an MRI to evaluate the lumbar spine not only to  determine the acuity of the fracture, but also to determine the extent  of the degenerative process and possible stenosis.  I will make further  recommendations pending that MRI review, but I think if the fracture is  not the major cause of his symptoms then he may be well served with an  epidural steroid injection and progressive mobilization with physical  therapy if medically cleared for that.      Alvy Beal, MD  Electronically Signed     DDB/MEDQ  D:  02/11/2008  T:  02/12/2008  Job:  [id number]   cc:   Larina Earthly, M.D.

## 2010-10-26 NOTE — Assessment & Plan Note (Signed)
[facility]                             PULMONARY OFFICE NOTE   NAME:[name] [name], [name] [name]                    MRN:          [id number]  DATE:05/02/2007                            DOB:          [birth year]    HISTORY OF PRESENT ILLNESS:  Patient is a 77 year old white male patient  of Dr. Lynelle Doctor who has a known history of emphysematous COPD and  asthmatic bronchitis who presents today complaining of a two week  history of dry cough, wheezing, and chest tightness.  Patient has had to  increase his Proventil use over the last several days.  Patient denies  any fever, purulent sputum, chest pain, orthopnea, PND, or leg swelling.   PAST MEDICAL HISTORY:  Reviewed.   CURRENT MEDICATIONS:  Reviewed.   PHYSICAL EXAMINATION:  Patient is a 77 year old white male in no acute  distress.  He is afebrile with stable vital signs.  O2 saturation is 94% on room  air.  HEENT:  Unremarkable.  NECK:  Supple without cervical adenopathy.  No JVD.  LUNGS:  The lung sounds reveal some expiratory wheezes bilaterally.  CARDIAC:  Regular rate and rhythm.  ABDOMEN:  Soft and nontender.  EXTREMITIES:  Warm without any edema.   IMPRESSION/PLAN:  Acute chronic obstructive pulmonary disease  exacerbation.  Patient is to begin prednisone taper over the next week.  Add in Mucinex DM bid.  Xopenex nebulizer was given today in the office.  Patient is to return back in 1-2 weeks with Dr. Delford Field or sooner if  needed.      Rubye Oaks, NP  Electronically Signed      [name], MD, [facility]  Electronically Signed   TP/MedQ  DD: 05/02/2007  DT: 05/02/2007  Job #: [id number]

## 2010-10-26 NOTE — Cardiovascular Report (Signed)
NAMEBERWYN, BIGLEY             ACCOUNT NO.:  [id number]   MEDICAL RECORD NO.:  [id number]          PATIENT TYPE:  INP   LOCATION:  2855                         FACILITY:  [facility]   PHYSICIAN:  Cristy Hilts. Jacinto Halim, MD       DATE OF BIRTH:  1932/01/30   DATE OF PROCEDURE:  06/26/2007  DATE OF DISCHARGE:                            CARDIAC CATHETERIZATION   PROCEDURE PERFORMED:  1. Abdominal aortogram.  2. Left femoral arteriogram with distal runoff.  3. Right femoral arteriogram distal runoff.  4. Pelvic arteriogram.  5. PTA and stenting of the left common and external iliac artery.   INDICATIONS:  Mr. Tipps is a 77 year old gentleman with hypertension,  hyperlipidemia who has been complaining of left hip claudication.  He  had abnormal Dopplers.  Given his lifestyle-limiting claudication, he  was brought to the catheterization lab to evaluate his peripheral  anatomy.  He also has mild claudication of his right lower extremities.   Abdominal aortogram:  Abdominal aortogram revealed presence of two renal  arteries on right and two renal arteries on the left to the superior  inferior pole, and they were widely patent.  The abdominal aorta showed  moderate amount of atherosclerotic changes with luminal irregularity,  especially the distal abdominal aorta.   Right common iliac artery with follow-through:  Right common iliac  artery showed a ostial 40% stenosis, but there was no pressure gradient  across this stenosis.  The external iliac artery and common femoral  artery had mild diffuse calcific luminal irregularity.  Right  superficial femoral artery had a proximal 50% luminal stenosis followed  by diffuse luminal irregularity in the proximal to mid segment and the  mid segment was completely occluded with extensive collaterals from the  profunda femoral artery.   Below the right knee, the anterior tibial artery showed diffuse 70-80%  mid stent segment stenosis.  However, there was  three-vessel runoff  noted in the right leg below the knee.   Left common iliac artery with follow-through:  The left common iliac  artery showed mild diffuse calcific luminal irregularity.  The left  common iliac artery at the bifurcation of internal and external iliac  artery showed a 70% focal stenosis.  There is mild diffuse disease  throughout the external iliac and common femoral artery.   The left superficial femoral artery showed diffuse 30% to 40% proximal  stenosis followed by a 40% to 50% calcific stenosis in the mid-to-distal  segment just outside of the Hunter's canal.   Below the left knee, the anterior tibial artery was occluded and there  is two-vessel runoff in the form of peroneal and posterior tibial  artery.   INTERVENTION DATA:  Successful PTA and direct stenting of the left  common iliac artery and external iliac artery with implantation of 4.0 x  12 mm Smart self-expanding stent, post dilated at 10.0 x 20 mm balloon  both into the external and into the internal iliac artery.  The stenosis  was reduced from 70% to less than 10% with excellent brisk flow.   RECOMMENDATIONS:  1. The patient will be kept  on aggressive medical therapy for occluded      right superficial femoral artery in the mid segment.  He has      extensive collaterals.  Unless he has significant lifestyle-      limiting claudication, medical therapy only.  2. I expect significant improvement in his left hip claudication.  The      left SFA disease will be treated medically.   A total of 150 mL of contrast was utilized for diagnostic and  interventional procedure.   PROCEDURE:  With the usual sterile precautions, using a 5-French right  femoral artery access, 5-French pigtail catheter was advanced to  abdominal aorta and abdominal aortogram was performed.  The same  catheter was utilized to perform pelvic arteriogram.  Then I was able to  cross over from the right femoral artery into the  left common iliac  artery and external iliac artery using a crossover catheter and a  guidewire.  Then a selective left iliac arteriogram with femoral follow-  through was performed.   Using heparin for anticoagulation, long Wholey wire was utilized for  angioplasty of the left external and common iliac artery junctional  stenosis.  Using 3000 units of heparin, I advanced a 4.0 x 12 mm self-  expanding Smart stent and positioning the stent at the site of the  lesion.  Stent was deployed and postdilated with a 10.0 x 20 mm  Powerflex balloon at 14 atmospheric pressure for 40 seconds.  Multiple  inflations were performed throughout the stent and also into the common  iliac artery at the outside of the inflow of the stent.  Performed with  excellent results were noted.  The lesion was left alone with mild  eccentric calcium plaque at the medial aspect of the stent.  The patient  did experience mild abdominal discomfort during balloon angioplasty.   Attention was directed towards the right common iliac artery and follow-  through.  The careful pullback of the right common iliac artery into the  common femoral artery.  There was no pressure gradient noted with a 7-  [place] sheath.  Then a right femoral arteriogram distal runoff was  performed in the right leg right lower extremity and then the patient  was taken off the table to the holding area in a stable condition.  The  patient tolerated the procedure well.  No immediate complications.      Cristy Hilts. Jacinto Halim, MD  Electronically Signed     JRG/MEDQ  D:  06/26/2007  T:  06/26/2007  Job:  [id number]

## 2010-10-26 NOTE — Assessment & Plan Note (Signed)
[facility]                             PULMONARY OFFICE NOTE   NAME:Weisenberger, TRAVES MAJCHRZAK                    MRN:          [id number]  DATE:05/18/2007                            DOB:          04/07/1932    HISTORY:  Mr. Molyneux is seen in followup.  He has not been seen since  May 2008 in this clinic.  He notes increased shortness of breath, cough  and wheezing.  He actually saw a nurse practitioner in November who  prescribed a brief course of prednisone and gave him a neb treatment in  the office.  He is maintained on Foradil and Flovent, but is overusing  his Foradil and has stopped his Flovent.   PHYSICAL EXAMINATION:  VITAL SIGNS:  Temperature 97, blood pressure  135/80, pulse 68, saturation 95% on room air.  CHEST:  Diminished breath sounds with evidence of wheeze or rhonchi.  CARDIAC:  Regular rate and rhythm without S3.  Normal S1 and S2.  ABDOMEN:  Soft, nontender.  EXTREMITIES:  No edema, clubbing or venous disease.  SKIN:  Clear.   IMPRESSION:  Asthmatic bronchitic flare with acute decompensation.   PLAN:  1. The patient is to stop Foradil and Flovent, and begin Symbicort 2      sprays b.i.d. 160/4.5.  2. The patient will also initiate prednisone 40 mg a day with a slow      taper.  3. We instructed the patient to the proper use of the Symbicort.  4. He will also initiate Spiriva daily.  5. I will see the patient back in followup.     [name], MD, [facility]  Electronically Signed    PEW/MedQ  DD: 05/18/2007  DT: 05/19/2007  Job #: [id number]   cc:   Gregary Signs A. Everardo All, MD

## 2010-10-26 NOTE — Discharge Summary (Signed)
[name] [name], [name] [name]             ACCOUNT NO.:  [id number]   MEDICAL RECORD NO.:  [id number]          PATIENT TYPE:  INP   LOCATION:  5025                         FACILITY:  [facility]   PHYSICIAN:  Larina Earthly, M.D.        DATE OF BIRTH:  1932-03-19   DATE OF ADMISSION:  02/10/2008  DATE OF DISCHARGE:  02/14/2008                               DISCHARGE SUMMARY   DISCHARGE DIAGNOSES:  1. Significant low back pain associated with mild L1 compression      fracture and lumbar stenosis status post epidural steroid injection      x1 by Interventional Radiology and followed by Dr. Shon Baton.  2. Questionable syncope with cardiac workup and neurological      evaluation benign as dictated below.  3. Hypertension, controlled.  4. Chronic obstructive pulmonary disease, stable but complicated by      varying and changing regimen from multiple providers as dictated      below.  5. Urinary tract infection, asymptomatic on treatment.  6. Mild dementia, stable.  7. Compression fracture of L1 in need of osteoporosis workup.  8. Constipation, resolved.  9. Pain management, adequate.   DISCHARGE MEDICATIONS:  1. Aricept 10 mg p.o. q.p.m.  2. Symbicort 160/4.5 two puffs at noon only.  3. Crestor 5-10 mg each day.  4. Foradil 12 mcg inhale twice daily.  5. Hydrochlorothiazide 12.5 mg daily.  6. Niaspan 1000 mg at bedtime, unclear if the patient is compliant      with this.  7. Vitamin B12 1000 mg daily.  8. Enteric-coated aspirin 81 mg daily.  9. Trental 400 mg p.o. t.i.d.  10.Trovan eye drops to each eye daily.   NEW MEDICATIONS:  1. Cipro 500 mg twice daily for 2 days.  2. MiraLax over the counter 17 g each day for bowel movement while on      Vicodin.  3. Vicodin 2 tablets every 6 hours as needed for pain, #50 with 1      refill written.   A prescription was also given for rolling walker with a diagnosis of the  L1 compression fracture and lumbar stenosis for home use.  The patient  will also  be discharged with [facility] for occupational and  physical therapy evaluation secondary to the patient's lumbar stenosis,  significant low back pain, and gait and mobility along with L1  compression fracture.   The patient will follow up with Dr. Shon Baton in approximately 1 month and  with Dr. Felipa Eth in approximately 2-3 weeks.  At which time, he will need  further evaluation of his pulmonary regimen in conjunction with Dr.  Delford Field as well as a repeat urinalysis for his urinary tract infection  and reevaluation of his pain management.   HISTORY OF PRESENT ILLNESS:  This is a 77 year old Caucasian male who  has a past medical history significant for COPD, a remote tobacco abuse,  negative stress test for workup of cardiac ischemia in January 2009,  peripheral vascular disease status post stent placement in the left  lower extremity, hypertension, hyperlipidemia, mild dementia who is  managed by multiple physicians including the [facility].  Please  see my history and physical on February 10, 2008, for extensive details.  However briefly, this is a patient who has had his recent pulmonary  regimen altered by the [facility] secondary to cause containment  and this has been complicated by the patient's short-term memory  deficits, which has rendered his current medication list questionable  with respect to what the patient is taking on a regular basis.  Per the  HPI, the patient presented to the emergency room after a questionable  syncopal episode that lasted less than 20 seconds but involved of fall  with injury to the lower back.  Cardiac and neurological assessment as  well as evaluation in the emergency room were benign, and labs were only  remarkable for UTI.  The patient was admitted for presumed syncopal  event to rule out myocardial infarction and did need further evaluation  and management of the patient's significant low back pain.   HOSPITAL COURSE:  1.  With respect to the patient's syncopal event, the patient's cardiac      enzymes were negative x3 with a note that his stress Cardiolite was      benign in January 2009 with Dr. Jacinto Halim.  The patient furthermore did      not complain of any chest pain or focal neurological deficits nor      would be seen on exam.  His telemetry evaluation was unremarkable      with the exception of 1 episode where his heart rate did dip down      to approximately 40, but this was only a 1-time recurrence and the      patient is asymptomatic.  Again, we questioned whether it was a      combination of multiple pulmonary medications and possibly a vagal      episode that resultant the patient's questionable syncopal response      prior to evaluation in the emergency room.  2. With respect to the patient's low back pain, the patient was      evaluated by myself in the emergency room and found to have no      focal neurological deficits and no spine tenderness; however, x-ray      of the lumbar spine did reveal significant degenerative disk      disease as well as questionable L1 compression fracture.  Given the      patient's continued pain and inability for proper mobilization on      an outpatient basis, Dr. Shon Baton of [facility] was      consulted and he also reviewed the radiological evidence at that      time and thought that the patient's pain was probably secondary to      significant degenerative disk disease and diskogenic back pain than      a symptomatic L1 compression fracture.  He also questioned whether      the patient has significant spinal stenosis.  He did order an MRI,      which revealed acute or subacute L1 inferior endplate compression      fracture with no retropulsed fragments or spinal stenosis, marrow      edema centered around L2 through L5 endplates that were likely      degenerative and disk degeneration that may be a possible source of      a right L4 radiculitis and  multifactorial moderate-to-severe  bilateral L4 neural foraminal stenosis.  Based on this, Dr. Shon Baton      did order an ESI by Interventional Radiology, which was performed      without complications but resulted in marginal improvement      immediately, but within the next 1-2 days in conjunction with      narcotic pain medication and muscle relaxers consisting of      Skelaxin, the patient did have some moderate improvement such that      he was able to work with physical therapy and ambulate to the      bathroom.  Physical therapy did recommend that he continue      assistance with home health on an outpatient basis and also      recommended a rolling walker for control.  The patient was      acceptable at this intervention and agreed to work with physical      therapy on an outpatient basis and follow up Dr. Shon Baton in      approximately 1 month.  3. Hypertension was controlled.  4. COPD.  Again, the patient's regimen has been varied based on input      and changes made by both Dr. Delford Field as well as the [facility].  Currently, the patient states that he is agreeable to a      regimen that consisted of [facility]      [facility].d. basis along with Symbicort use just once a day at noon and      Proventil on an as-needed basis.  At this time, we did continue the      Combivent ordered by the [facility]  5. UTI, asymptomatic.  We will complete a 7-day regimen of Cipro.  6. Mild dementia.  The patient has been stable on his current regimen      of Aricept and clearly does have some mild short-term memory      impairment, but he is assisted by his wife at home and is able to      perform all of his activities of daily living without significant      intervention prior to hospitalization.  7. Compression fracture.  Again, we will need outpatient management      along with obtaining bone density exam as well as vitamin D levels      once seen on  an outpatient basis.  8. Constipation.  Resolved with MiraLax and Fleet enema.  We will      continue MiraLax on an outpatient basis.   Laboratory evaluation during this hospitalization is significant for  hemoglobin ranging from 14.5-14.9 with a hematocrit in the 42-44% range,  platelet count ranging in the 150 range with a white blood cell count  ranging from 8.6-10.0.  Sodium has been from 133-136, potassium normal  3.9-4.3, BUN ranging from 15-24, creatinine 1.08-1.17.  Glucose and CBGs  all ranging from 100-120.  Liver function test reveals an AST 31, ALT  24, alkaline phosphatase 72, total bilirubin 12.8, which we repeated on  an outpatient basis.  Cardiac enzymes revealed a troponin I all less  than 0.01 with CKs ranging from 600-800 with a CK-MB ranging from 7-10  with a normal cardiac index.  Urinalysis was positive with no urine  cultures sent during this hospitalization.      Larina Earthly, M.D.  Electronically Signed     RA/MEDQ  D:  02/14/2008  T:  02/15/2008  Job:  [id number]   cc:   Cristy Hilts. Jacinto Halim, MD  [name], MD, FCCP  Alvy Beal, MD

## 2010-10-26 NOTE — Assessment & Plan Note (Signed)
[facility]                             PULMONARY OFFICE NOTE   NAME:PELCHATKastin, Cerda                    MRN:          [id number]  DATE:10/20/2006                            DOB:          1932/04/15    Mr. Eichel is a 77 year old white male, history of chronic obstructive  lung disease, chronic bronchitis, his level of dyspnea is the same, he  is having a minimal cough, maintains Foradil one spray b.i.d., he is on  Flovent 220 mcg strength four sprays b.i.d.   PHYSICAL EXAMINATION:  VITAL SIGNS:  Temp was 98, blood pressure 134/80,  pulse 62, saturation 95% room air.  CHEST:  Distant breath sounds with no evidence of wheeze or rhonchi.  CARDIAC:  Regular rate and rhythm without any S3, normal S1 S2.  ABDOMEN:  Soft and nontender.  EXTREMITIES:  No edema or clubbing.  SKIN:  Clear.  NEUROLOGIC EXAM:  Intact.  HEENT:  No jugulovenous distention, no lymphadenopathy, oropharynx  clear, neck supple.   IMPRESSION:  Chronic obstructive lung disease, stable at this time.   PLAN:  Maintain Foradil and Flovent as is, benzonatate  was refilled,  and we will see the patient back in return followup in three months.     [name], MD, [facility]  Electronically Signed    PEW/MedQ  DD: 10/20/2006  DT: 10/20/2006  Job #: [id number]   cc:   Gregary Signs A. Everardo All, MD

## 2010-10-26 NOTE — H&P (Signed)
NAMEERICK, [name] [name]             ACCOUNT NO.:  [id number]   MEDICAL RECORD NO.:  [id number]          PATIENT TYPE:  INP   LOCATION:  4705                         FACILITY:  [facility]   PHYSICIAN:  Larina Earthly, M.D.        DATE OF BIRTH:  03-11-1932   DATE OF ADMISSION:  02/10/2008  DATE OF DISCHARGE:                              HISTORY & PHYSICAL   CHIEF COMPLAINT:  Syncope with low back pain.   HISTORY OF PRESENT ILLNESS:  This is a 77 year old Caucasian male who  has a past medical history significant for COPD, status post remote  tobacco abuse, a  negative stress test for workup of cardiac ischemia,  peripheral vascular disease status post stent placement in the left  lower extremity, hypertension, hyperlipidemia, mild dementia who is  managed by myself, Dr. Jacinto Halim, and Dr. Delford Field as well as doctors at the  [facility].  The latter combined with cost issues for his  medications have resulted in many medication changes especially with  respect to his pulmonary regimen within the last 2-3 days.   Today, the patient was using his Combivent metered-dose inhaler with a  spacer for the third time which was recently acquired from the [facility].  This use of the Combivent metered-dose inhaler was also  in combination with a regimen that includes Symbicort and Foradil.  The  patient had an unwitnessed syncopal episode that occurred at  approximately 10:30 this morning resulting in the patient falling to the  floor.  The latter was heard by the patient's wife and upon arrival by  the wife approximately 15-20 seconds later, the patient was yelling  concerning back pain.  The patient was unable to stand at that time but  was able to move all four extremities and answer questions  appropriately.  The patient was eventually helped to the bed but refused  ER evaluation at that time until approximately 3-4 hours later at the  insistence of his daughter.  He was transported to the ER  via EMS.  At  no time did the patient complain of chest pain, shortness of breath,  focal neurological deficit, headache, visual abnormalities, nausea,  vomiting, fevers, chills, cough and the patient did not have any  preexisting significant low back pain or urinary difficulties.   In the emergency room, the patient was given morphine for his pain.  X-  ray of the lumbar spine revealed spondylosis, normal alignment and  questionable mild anterior wedging at the L1 vertebra of indeterminate  age.  EKG was unremarkable for any significant changes.  Exam by the  emergency room physician was benign.  CBC, BMET, and cardiac enzymes  were all unremarkable.  Urinalysis was positive for an infection.  The  patient is now admitted for his presumed syncopal event, low back pain  and need for physical therapy evaluation and urinary tract infection.  Chest x-ray has been ordered by myself and is currently pending.   LABORATORY EVALUATION:  White blood cell count 10.0, hemoglobin 14.8,  hematocrit 44.2%, platelet count 154.  Sodium 134, potassium 3.9, BUN  15, creatinine 1.08, glucose 102, calcium 9.3.  Cardiac enzymes were  unremarkable with a troponin I less than 0.05.  EKG reveals a sinus  rhythm with no significant change compared with that obtained in 2005  with some nonspecific ST changes.   SOCIAL HISTORY:  The patient is married x2 to 3 decades, quit smoking  approximately 1990 after 45 pack year history and drinks occasional  beer, is retired from [employer] division.   FAMILY HISTORY:  Significant for father having passed away at the age of  75 of seizure disorder.  Mother having passed away at the age of 7 of  complications of type 2 diabetes.  The patient does have one brother and  two sisters with a past history significant for lung cancer and type 2  diabetes.  The patient does have 6 children and multiple grandchildren.   ALLERGIES:  The patient has allergies to  PENICILLIN.   CURRENT MEDICATIONS:  1. Foradil 1 puff b.i.d.  2. Aspirin 81 mg p.o. daily.  3. Symbicort 160/4.5 one puff b.i.d.  4. Hydrochlorothiazide 12.5 p.o. daily.  5. Aricept 10 mg p.o. q. p.m.  6. Crestor 5 mg p.o. daily.  7. Trental 400 mg p.o. t.i.d.  8. Travatan eye drops 1 drop bilaterally to each eye daily.  9. B12 1000 mg one p.o. daily.  10.Combivent 2 puffs b.i.d.  11.Currently, the patient is not using previous medications such as      Bystolic, Plendil, Plavix, Proventil inhaler, Niaspan, galantamine      and Asmanex.   PHYSICAL EXAMINATION:  GENERAL:  Currently, the patient is answering all  questions appropriately, lying flat in bed in no apparent distress with  the exception of his low back pain and some mild wheezing.  VITAL SIGNS:  Blood pressure is 160/81, pulse 61 and regular,  respirations 20, oxygen saturation 97% on 2 L by nasal cannula.  The  patient is alert and oriented x3 but does have some short-term memory  deficits which are evident upon questioning but readily answered by the  patient's wife.  HEENT:  Sclerae anicteric.  Extraocular movements are intact.  Pupils  are equal and reactive to light and accommodation.  Face is symmetric.  Tongue is midline.  There is no oropharyngeal lesions.  NECK:  Supple.  There is no cervical lymphadenopathy.  No JVD.  LUNGS:  Mild expiratory wheezing.  CARDIOVASCULAR:  Distant heart sounds but regular rate and rhythm with  no murmurs, rubs or gallops appreciated.  ABDOMEN:  Slightly protuberant but nontender and soft with bowel sounds  present.  No hepatosplenomegaly.  EXTREMITIES:  No edema.  Pedal pulses are intact but slightly  diminished.  No cyanosis is present.  The patient can move all four  extremities.  Cranial nerves are grossly intact.  Light touch is intact  grossly and there is no bruising or spinal tenderness but the patient  does have significant pain upon moving from side to side.    ASSESSMENT AND PLAN:  1. Presumed syncope, unwitnessed, but lasting approximately less than      20 seconds by wife's report with no seizure activity reported, no      focal neurological deficits and a negative ischemic cardiac workup      in June 22, 2007.  EKG reveals no significant changes and labs      were unremarkable with exception of urinary tract infection.  Chest      x-ray is pending.  Our  plan will be to admit the patient on      telemetry, rule out for myocardial infarction and if abnormal,      consult Dr. Jacinto Halim.  Question whether this is a repercussion of the      multiple new medications complicated by short-term memory deficits.      We will currently treat his urinary tract infection and also      question possible vagal episode.  2. Chronic obstructive pulmonary disease.  We will continue DuoNeb      nebulizers for now and may need Dr. Lynelle Doctor input for      consolidation of therapy but possibly using Symbicort and Proventil      on an as-needed basis if possible.  3. Urinary tract infection.  We will add Cipro for now and follow up      on culture and sensitivity.  4. Low back pain status post fall.  X-ray somewhat questioning L1 mild      wedging compression fracture.      However, the patient clearly has no tenderness over that area on      exam.  We will have the patient get out of bed with assistance and      obtain a physical therapy consult and provide pain relief and      management at this time and follow up on possible osteoporosis      workup on an outpatient basis.      Larina Earthly, M.D.  Electronically Signed     RA/MEDQ  D:  02/10/2008  T:  02/11/2008  Job:  [id number]   cc:   Charlcie Cradle. Delford Field, MD, [facility]  Cristy Hilts. Jacinto Halim, MD

## 2010-10-29 NOTE — Assessment & Plan Note (Signed)
[facility]                               PULMONARY OFFICE NOTE   NAME:[name] [name], [name] [name]                    MRN:          [id number]  DATE:01/16/2006                            DOB:          [birth year]    [name] returns today in followup.  This is a 77 year old white male  with history of chronic obstructive lung disease and asthmatic bronchitis,  who was seen by Dr. Shelle Iron on January 09, 2006 with an acute flare.  The  patient was given prednisone pulse and taper along with Avelox for 7 days.  Pulmonary-wise, the patient is still coughing, still having some dyspnea, no  longer having hemoptysis, maintains Brovana by nebulization b.i.d., Asmanex  two sprays daily.   On exam, temperature was 97.9, blood pressure 124/82, pulse of 69,  saturation 96% on room air.  Chest showed diminished breath sounds with  prolonged expiratory phase, no wheeze or rhonchi noted.  Cardiac exam showed  a regular rate and rhythm without S3, normal S1 and S2.  Abdomen was soft  and nontender.  Extremities showed no edema or clubbing.  Skin was clear.  Neurologic exam was intact.  HEENT exam showed no jugular venous distention  or lymphadenopathy.   IMPRESSION:  1.  Pseudowheezing with upper airway dysfunction, recent hemoptysis, history      of smoking, rule out in this case vocal cord dysfunction versus vocal      cord abnormalities, upper airway malignancy in this regard.  The patient      will be referred to Dr. Flo Shanks for evaluation on January 17, 2006.  2.  Chronic bronchitis with bronchiectasis.  For this, the patient will      maintain inhaled medicines as currently dosed and we will obtain a CT      scan of the chest on January 17, 2006 to evaluate levels of      bronchiectasis.                                   [name], MD, [facility]   PEW/MedQ  DD:  01/16/2006  DT:  01/17/2006  Job #:  [id number]   cc:   Gregary Signs A. Everardo All, MD

## 2010-10-29 NOTE — Assessment & Plan Note (Signed)
[facility]                               PULMONARY OFFICE NOTE   NAME:[name], [name]                    MRN:          [id number]  DATE:04/13/2006                            DOB:          1931/06/27    Mr. [name] [name] is a 76 year old white male, with a history of chronic  obstructive lung disease with a primary emphysematous component. Overall,  the patient is improved with decreased shortness of breath and decreased  wheezing.   CURRENT MEDICATIONS:  1. Foradil one spray b.i.d.  2. Asmanex one spray b.i.d.  3. Benzonatate 100 mg h.s.  4. Aspirin 81 mg daily.   Overall, his breathing is improved with decreased cough and decreased mucus  production.   PHYSICAL EXAMINATION:  VITAL SIGNS: Temperature 98, blood pressure 130/82,  pulse 81, room air saturation 91%.  CHEST: Diminished breath sounds with no wheeze or rhonchi noted.  CARDIAC: Regular rate and rhythm without S3. Normal S1 and S2.  ABDOMEN: Soft, non-tender.  EXTREMITIES: No edema or clubbing.  SKIN: Clear.  NEUROLOGIC: Intact.  HEENT: No jugular vein distention, lymphadenopathy. Oral pharynx clear.  NECK: Supple.   IMPRESSION:  Chronic lung disease with a primary emphysematous component and  is stable at this time.   PLAN:  The patient is to maintain inhaled medicines as currently dosed, and  we will see the patient back in return for followup in three months.     [name] Cradle Delford Field, MD, [facility]  Electronically Signed    PEW/MedQ  DD: 04/13/2006  DT: 04/13/2006  Job #: [id number]   cc:   [name] Signs A. Everardo All, MD

## 2010-10-29 NOTE — Assessment & Plan Note (Signed)
[name]                               PULMONARY OFFICE NOTE   NAME:[name], [name] WEDEL                    MRN:          [id number]  DATE:03/13/2006                            DOB:          01/07/1932    [name] is a 77 year old white male with history of chronic obstructive  lung disease, hypertension, degenerative joint disease, still having  significant dyspnea. He feels like the [name] [name] was not useful to him, and he  stopped this on his own. He is maintaining Asmanex 1 spray b.i.d.,  benzonatate 100 mg t.i.d., aspirin 81 mg daily, Proventil p.r.n. He is  averaging the Proventil usage 2 puffs twice daily.   PHYSICAL EXAMINATION:  VITAL SIGNS:  On exam, temperature 97, blood pressure  120/80, pulse 93, saturation 95% on room air.  CHEST:  Showed distant breath sounds with prolonged expiratory phase. No  wheeze or rhonchi noted.  CARDIAC:  Showed a regular rate and rhythm without S3, normal S1 and S2.  ABDOMEN:  Soft, nontender.  EXTREMITIES:  Showed no edema or clubbing.  SKIN: Clear.  NEUROLOGICAL:  Intact.  HEENT:  Showed no jugular venous distention. No lymphadenopathy.   IMPRESSION:  Is that of chronic obstructive lung disease, asthmatic  bronchitic component with mild flare, failure to respond to [name] [name] therapy.   PLAN:  Discontinue Brovana and begin Foradil 1 capsule in Aerolizer b.i.d.  Continue Asmanex 1 spray b.i.d., Proventil p.r.n. Begin prednisone 40 mg a  day and taper down by 10 mg every 3 days until off, and will see the patient  back in followup. A flu vaccine was also administered.       [name] Cradle Delford Field, MD, [name] [name]      PEW/MedQ  DD:  03/13/2006  DT:  03/14/2006  Job #:  [id number]   cc:   [name] Signs A. Everardo All, MD

## 2010-10-29 NOTE — Assessment & Plan Note (Signed)
[facility]                             PULMONARY OFFICE NOTE   NAME:PELCHATBranston, Halsted                    MRN:          [id number]  DATE:06/16/2006                            DOB:          Nov 05, 1931    Mr. Lyerly is a 77 year old white male history of chronic obstructive  lung disease, chronic bronchitis.  Comes in today doing quite well with  no evidence of dyspnea or cough.  Maintains Foradil one spray b.i.d.,  Asmanex one spray b.i.d., Proventil HFA p.r.n., HCTZ 25 mg daily, Lescol  daily, Aricept 10 mg daily.   EXAM:  Temp 97, blood pressure 128/72, pulse 73, saturation was 97% on  room air.  CHEST:  Diminished breath sounds with prolonged expiratory phase, no  wheeze or rhonchi were noted.  CARDIAC:  A regular rate and rhythm without S3, normal S1, S2.  ABDOMEN:  Soft, nontender.  EXTREMITIES:  No edema or clubbing.  SKIN:  Clear.  NEUROLOGIC:  Intact.  HEENT:  No jugular venous distention, no lymphadenopathy.  OROPHARYNX:  Clear.  NECK:  Supple.   IMPRESSION:  Chronic lung disease with stable airflow function.   PLAN:  To maintain Foradil as currently dosed and Asmanex as is, will  see the patient back in return follow up in 2 months.     [name], MD, [facility]  Electronically Signed    PEW/MedQ  DD: 06/16/2006  DT: 06/16/2006  Job #: [id number]   cc:   Gregary Signs A. Everardo All, MD

## 2010-10-29 NOTE — Assessment & Plan Note (Signed)
[facility]                             PULMONARY OFFICE NOTE   NAME:[name] [name] [name]                    MRN:          [id number]  DATE:06/29/2006                            DOB:          04/12/1932    HISTORY OF PRESENT ILLNESS:  The patient is a 77 year old, white male  patient of [name] [name], who has a known history of emphysematous COPD  and asthmatic bronchitis who presents with a 1-week history of  progressively worsening shortness of breath, wheezing, and dry cough.  The patient denies any hemoptysis, orthopnea, PND, recent travel, or  antibiotic use.  The patient is maintained on Asmanex 1 puff twice  daily, Foradil b.i.d., and p.r.n. Proventil HFA.   PAST MEDICAL HISTORY:  Reviewed.   CURRENT MEDICATIONS:  Reviewed.   PHYSICAL EXAM:  The patient is an elderly male in no acute distress.  He is afebrile with stable vital signs.  O2 saturation is 95% on room  air.  HEENT:  Unremarkable.  NECK:  Supple without adenopathy.  No JVD.  Lung sounds reveal coarse breath sounds bilaterally with a few  expiratory wheezes.  CARDIAC:  Regular rate.  ABDOMEN:  Obese and soft.  EXTREMITIES:  Warm without any edema.   IMPRESSION AND PLAN:  Acute exacerbation of asthmatic bronchitis.  The  patient is to begin a prednisone taper over the next week.  Add in  Mucinex DM twice daily.  The patient was given a Xopenex nebulizer  treatment in the office.  We will hold on antibiotics at this time.  The  patient will return back with [name] [name] as scheduled or sooner if  needed.      [name] Oaks, NP  Electronically Signed      [name] Cradle Delford Field, MD, [facility]  Electronically Signed   TP/MedQ  DD: 06/29/2006  DT: 06/29/2006  Job #: [id number]

## 2010-10-29 NOTE — Assessment & Plan Note (Signed)
[facility]                               PULMONARY OFFICE NOTE   NAME:Nordmeyer, GIOVANY COSBY                    MRN:          [id number]  DATE:01/23/2006                            DOB:          [birth year]    PROGRESS NOTE:  I saw Mr. [name] [name] today with his wife for further evaluation  of his shortness of breath and cough. He has been seen several times within  the last month for these same symptoms. He was given a course of Avelox and  prednisone. He said that his symptoms of his breathing difficulties seemed  to improve after the use of the prednisone and Avelox. He is currently still  having symptoms of shortness of breath associated with coughing, although he  is not having much as far as sputum production and he is not having anymore  symptoms of hemoptysis. He does get occasional tightness in his chest  associated with some wheezing, although from the description from him and  his wife, it sounds like the wheezing is more coming from his upper airway.  He says that when he uses the [place], that seems to help. He has been  having some nasal congestions with some post-nasal drip and he also has  symptoms of heartburn with reflux. His wife also says that he snores quite  loudly and he will wake up with a choking or coughing sensation. He says  that in spite of sleeping for several hours during the day, that he still  feels tired in the morning and will fall asleep very easily during the day.  His wife also says that he is a fairly restless sleeper and it has gotten to  the point where they cannot sleep in the same room anymore.   CURRENT MEDICATIONS:  1. Aspirin 81 mg daily.  2. Brevana nebulizer b.i.d.  3. Asthmanex 2 puffs daily.  4. Hydrochlorothiazide 25 mg daily.  5. Proventil HFC inhaler, which he is using 3 to 4 times a day, although      he says this does not seem to make much of a difference as far as his      symptoms.   REVIEW OF  SYSTEMS:  He says that he is due to have an appointment with ENT  evaluation with Dr. Lazarus Salines on January 26, 2006. Additionally, he says that  he has not been rinsing his mouth out after he uses his Asthmanex inhaler.  HEENT:  There is no sinus tenderness. He has a boggy nasal mucosa with a  clear nasal discharge. He has a Mallampati III airway with a low lying soft  palate and oropharyngeal crowding. He also has whitish lesions along the  soft palpate and posterior pharynx, which has surrounding erythema and the  whitish discharge scratches away with the tongue blade. There is no  lymphadenopathy, no thyromegaly. He had inspiratory stridor heard over his  upper neck, which improved with purse lip breathing. HEART:  With S1 and S2.  No murmur. CHEST:  He had bilateral wheezing, which was more inspiratory,  which again improved with purse lip breathing.  ABDOMEN:  Obese, soft, and  tender.  EXTREMITIES:  There is no edema.   LABORATORY DATA:  CT scan of the chest from January 17, 2006 showed stable  changes of COPD with interstitial lung disease, with minimal atelectasis and  scarring in the lingula and right middle lobe, and peribronchial thickening  bilaterally, and a small hiatal hernia but no significant changes from  previous CT scan from 2005.   IMPRESSION:  1. LIKELY VOCAL CORD DYSFUNCTION RELATED TO POSSIBLE POST-NASAL DRIP, AS      WELL AS REFLUX:  Additionally, he has physical signs consistent with      oral Candidiasis. At this time, what I have advised him to do, I have      given him a sample of Veramist nasal spray and I have advised him to      use 2 sprays in each nostril once a day. Additionally, I have advised      him to stop using his Asthmanex for the time being. And I have given      his a prescription for Nystatin swish and swallow, 100,000 units per      milliliter and he is to use 5 ml q.i.d. for at least the next 6 days      and then he is due to have followup with  ENT on January 26, 2006, at      which time he can have further evaluation as to the need to continue      the use of the Nystatin. I have advised him to continue the use of his      Brevana as well as his Proventil and I have suggested to him that if he      notices his breathing gets significantly worse while off of the      Asthmanex, he should call our office for urgent evaluation. Otherwise,      he is due to have followup with Dr. Delford Field on February 05, 2006.      Additionally, with his symptoms of reflux with a hiatal hernia, I have      suggested that he try using Prilosec 20 mg p.o. daily and advised him      to use this 30 to 45 minutes before breakfast to gain optimal benefit      from this. I have also advised him about dietary modifications, to see      if this will help with some of his symptoms as well, as this could be      contributing to his possible vocal cord dysfunction.  2. CHRONIC OBSTRUCTIVE PULMONARY DISEASE WITH ASTHMATIC COMPONENT:  I am      not sure if he is actually having an asthma exacerbation at this time,      as it appears that most of his symptoms are related to his upper airway      and possible vocal cord dysfunction. This can also be further assessed      with his response to the above stated interventions as well as ENT      evaluation.  3. OBESITY:  Symptoms are daytime sleepiness as well as symptoms snoring      and sleep disturbance. Given his physical findings as well as symptoms      descriptions, I would consider that he may possibly have obstructive      sleep apnea. At this time, I do feel that it would be warranted for him  to undergo further evaluation of this with an overnight polysomnogram      and then depending upon the results of this, if indeed he does have      sleep apnea, further recommendations could be make with regards to the      appropriate treatment for this.                                  Coralyn Helling, MD   VS/MedQ   DD:  01/23/2006  DT:  01/24/2006  Job #:  [id number]   cc:   Zola Button T. Lazarus Salines, MD

## 2010-10-29 NOTE — Assessment & Plan Note (Signed)
[facility]                               PULMONARY OFFICE NOTE   NAME:Moudy, [name] [name]                    MRN:          [id number]  DATE:12/21/2005                            DOB:          [birth year]    HISTORY OF PRESENT ILLNESS:  Mr. Centola is a 77 year old white male with a  history of chronic obstructive lung disease and asthmatic bronchitis who was  seen by Dr. Maple Hudson for me on December 08, 2005 with a COPD exacerbation.  The  patient was given a Depo-Medrol injection, a nebulizer Xopenex treatment.  He was told to continue the [facility] and given a prednisone pulse and taper.  If worse, he should go to the emergency room.   PHYSICAL EXAMINATION:  VITAL SIGNS:  On exam today, his temperature is 97,  blood pressure 128/80, pulse 71, saturation 96% on room air.  CHEST:  Distant breath sounds, a few expiratory wheezes.  CARDIAC:  A regular rate and rhythm without S3.  Normal S1 and S2.  Abdomen  was soft, nontender.  EXTREMITIES:  No edema or clubbing.  SKIN:  Clear.   IMPRESSION:  Asthmatic bronchitis with poor air flow function and increased  lower airway inflammation.  The patient did not tolerate QVAR therapy.   PLAN:  The plan for the patient is to switch to Asmanex 2 sprays b.i.d. for  1 week, then 2 sprays daily thereafter.  Continue Brovana nebulization  b.i.d.  Will see the patient back in followup in a month's time.                                   [name], MD, FCCP   PEW/MedQ  DD:  12/21/2005  DT:  12/22/2005  Job #:  [id number]

## 2010-10-29 NOTE — Op Note (Signed)
NAME:  [name] [name], [name] [name]                       ACCOUNT NO.:  [id number]   MEDICAL RECORD NO.:  [id number]                   PATIENT TYPE:  AMB   LOCATION:  DSC                                  FACILITY:  [facility]   PHYSICIAN:  Robert A. Thurston Hole, M.D.              DATE OF BIRTH:  [birth year]   DATE OF PROCEDURE:  01/26/2004  DATE OF DISCHARGE:                                 OPERATIVE REPORT   PREOPERATIVE DIAGNOSIS:  Left knee chondromalacia and synovitis, with  degenerative joint disease.   POSTOPERATIVE DIAGNOSIS:  Left knee chondromalacia and synovitis, with  degenerative joint disease.   PROCEDURE:  Left knee EOA followed by arthroscopic chondroplasty with  partial synovectomy.   SURGEON:  Elana Alm. Thurston Hole, M.D.   ASSISTANT:  Julien Girt, P.A.   ANESTHESIA:  Local with MAC.   OPERATIVE TIME:  30 min.   COMPLICATIONS:  None.   INDICATIONS FOR PROCEDURE:  Mr. Boudoin is a 77 year old gentleman who has  had significant left knee pain for the past two to three months, increasing  in nature.  Examination and MRI documented significant chondromalacia, loose  bodies, DJD and synovitis.  He has failed conservative care and is now to  undergo arthroscopy.   DESCRIPTION OF PROCEDURE:  Mr. Weldy was brought to the operating room on  January 26, 2004, after a knee block had been placed in the holding room by  anesthesia.  He was placed on the operating room table in supine position.  The left knee was examined under anesthesia.  Range of motion was 0-125  degrees, 1-2+ crepitation, knee stable to ligamentous examination and normal  patellar tracking.   The left leg was prepped using sterile Duraprep and draped using a sterile  technique.  Originally, through an anterolateral portal, the arthroscope  with a pump attached was placed into an anteromedial portal.  An  arthroscopic probe was placed.  On initial inspection of the medial  compartment, he was found to have 40% grade  4 and 50% grade 3  chondromalacia; which was debrided.  Medial meniscus was intact.  Intercondylar notch inspected.  Anterior and posterior cruciate ligaments  were normal.  Lateral compartment inspected.  There were small, loose  articular cartilage loose bodies which were removed.  He had 25% grade 3  chondromalacia of lateral compartment, which was debride.  The lateral  meniscus was intact.  The patellofemoral joint showed 25% grade 3  chondromalacia, which was debrided.  The patella tracked normally.  Significant synovitis in the medial lateral gutters were debrided;  otherwise, they were free of pathology.   After this was done, it was felt that all pathology had been satisfactorily  addressed.  The instruments were removed.  The portals were closed with 3-0  nylon suture and injected with 0.25% Marcaine with epinephrine and 4 mg of  morphine.  Sterile dressings applied and the patient awakened.  He was taken  to the recovery room in stable condition.   FOLLOWUP CARE:  Mr. Broxson will be followed as an outpatient on Vicodin and  Naprosyn.  Will see him back in the office in one week for sutures out and  followup.                                               Robert A. Thurston Hole, M.D.    RAW/MEDQ  D:  01/26/2004  T:  01/26/2004  Job:  [id number]

## 2010-10-29 NOTE — Assessment & Plan Note (Signed)
[facility]                               PULMONARY OFFICE NOTE   NAME:PELCHATBenjimen, Kelley                      MRN:          [id number]  DATE:02/07/2006                            DOB:          [birth year]    Mr. Setterlund returns today in followup and is a 77 year old white male.  History of chronic obstructive lung disease, asthmatic bronchitis.  Recently  in the emergency twice over the last month for COPD exacerbation.  Pulmonarywise, the patient is improved with decreased choking and cough.  Level of the shortness of breath is improved as well.  Patient maintains the  Asmanex two sprays each nostril daily, benzonatate 100 mg h.s., Brovana  nebulization b.i.d.  He states [name] has been beneficial to him.   PHYSICAL EXAMINATION:  Temp:  97.  Blood pressure:  118/70.  Pulse:  85.  Saturation 96% on room air.  CHEST:  Diminished breath sounds, prolonged expiratory phase.  No wheeze or  rhonchi.  CARDIAC:  Regular rate and rhythm without S3.  Normal S1, S2.  ABDOMEN:  Soft, nontender.  EXTREMITIES:  No edema or clubbing.  SKIN:  Clear.  NEUROLOGIC:  Intact.  HEENT:  No jugular venous distention.  No lymphadenopathy.  Oropharynx  clear.  Neck supple.   IMPRESSION:  1. Chronic obstructive lung disease.  No evidence of upper airway      abnormality.  2. Acute bronchitis, now improving.   PLAN:  Maintain inhaled medicines as currently dosed.  No change in plan of  care.  Return in two months.                                   [name], MD, FCCP   PEW/MedQ  DD:  02/07/2006  DT:  02/08/2006  Job #:  [id number]

## 2010-11-04 ENCOUNTER — Emergency Department (HOSPITAL_BASED_OUTPATIENT_CLINIC_OR_DEPARTMENT_OTHER)
Admission: EM | Admit: 2010-11-04 | Discharge: 2010-11-04 | Disposition: A | Discharge status: Home / Self Care | Payer: Medicare Other | Attending: Emergency Medicine | Admitting: Emergency Medicine

## 2010-11-04 ENCOUNTER — Emergency Department (INDEPENDENT_AMBULATORY_CARE_PROVIDER_SITE_OTHER): Payer: Medicare Other

## 2010-11-04 DIAGNOSIS — M25519 Pain in unspecified shoulder: Secondary | ICD-10-CM

## 2010-11-04 DIAGNOSIS — I10 Essential (primary) hypertension: Secondary | ICD-10-CM | POA: Insufficient documentation

## 2010-11-04 DIAGNOSIS — IMO0002 Reserved for concepts with insufficient information to code with codable children: Secondary | ICD-10-CM | POA: Insufficient documentation

## 2010-11-04 DIAGNOSIS — J449 Chronic obstructive pulmonary disease, unspecified: Secondary | ICD-10-CM | POA: Insufficient documentation

## 2010-11-04 DIAGNOSIS — W15XXXA Fall from cliff, initial encounter: Secondary | ICD-10-CM

## 2010-11-04 DIAGNOSIS — Y92009 Unspecified place in unspecified non-institutional (private) residence as the place of occurrence of the external cause: Secondary | ICD-10-CM | POA: Insufficient documentation

## 2010-11-04 DIAGNOSIS — F0390 Unspecified dementia without behavioral disturbance: Secondary | ICD-10-CM | POA: Insufficient documentation

## 2010-11-04 DIAGNOSIS — W06XXXA Fall from bed, initial encounter: Secondary | ICD-10-CM | POA: Insufficient documentation

## 2010-11-04 DIAGNOSIS — J4489 Other specified chronic obstructive pulmonary disease: Secondary | ICD-10-CM | POA: Insufficient documentation

## 2010-11-04 DIAGNOSIS — Z79899 Other long term (current) drug therapy: Secondary | ICD-10-CM | POA: Insufficient documentation

## 2010-12-17 ENCOUNTER — Other Ambulatory Visit: Payer: Self-pay | Admitting: Cardiology

## 2011-02-01 ENCOUNTER — Encounter: Payer: Self-pay | Admitting: Cardiology

## 2011-02-04 ENCOUNTER — Other Ambulatory Visit: Payer: Self-pay | Admitting: Critical Care Medicine

## 2011-03-03 LAB — URINALYSIS, ROUTINE W REFLEX MICROSCOPIC
Glucose, UA: NEGATIVE
Ketones, ur: NEGATIVE
Protein, ur: NEGATIVE
Urobilinogen, UA: 1

## 2011-03-03 LAB — URINE MICROSCOPIC-ADD ON

## 2011-03-05 ENCOUNTER — Other Ambulatory Visit: Payer: Self-pay | Admitting: Critical Care Medicine

## 2011-03-16 LAB — CBC
HCT: 42.8
Hemoglobin: 14.9
MCV: 88.3
RBC: 4.85
WBC: 9.1

## 2011-03-16 LAB — GLUCOSE, CAPILLARY
Glucose-Capillary: 116 — ABNORMAL HIGH
Glucose-Capillary: 118 — ABNORMAL HIGH
Glucose-Capillary: 119 — ABNORMAL HIGH

## 2011-03-16 LAB — BASIC METABOLIC PANEL
Chloride: 94 — ABNORMAL LOW
GFR calc Af Amer: >60
Potassium: 4.2

## 2011-05-03 ENCOUNTER — Other Ambulatory Visit: Payer: Self-pay | Admitting: Critical Care Medicine

## 2011-05-30 ENCOUNTER — Other Ambulatory Visit: Payer: Self-pay | Admitting: Critical Care Medicine

## 2011-06-14 DIAGNOSIS — M069 Rheumatoid arthritis, unspecified: Secondary | ICD-10-CM

## 2011-06-14 HISTORY — DX: Rheumatoid arthritis, unspecified: M06.9

## 2011-06-23 ENCOUNTER — Ambulatory Visit (HOSPITAL_BASED_OUTPATIENT_CLINIC_OR_DEPARTMENT_OTHER)
Admission: RE | Admit: 2011-06-23 | Discharge: 2011-06-23 | Disposition: A | Discharge status: Home / Self Care | Payer: Medicare Other | Source: Ambulatory Visit | Attending: Critical Care Medicine | Admitting: Critical Care Medicine

## 2011-06-23 ENCOUNTER — Ambulatory Visit (INDEPENDENT_AMBULATORY_CARE_PROVIDER_SITE_OTHER): Payer: Medicare Other | Admitting: Critical Care Medicine

## 2011-06-23 ENCOUNTER — Encounter: Payer: Self-pay | Admitting: Critical Care Medicine

## 2011-06-23 VITALS — BP 126/82 | HR 67 | Temp 98.3°F | Ht 67.0 in | Wt 206.0 lb

## 2011-06-23 DIAGNOSIS — I10 Essential (primary) hypertension: Secondary | ICD-10-CM | POA: Insufficient documentation

## 2011-06-23 DIAGNOSIS — R059 Cough, unspecified: Secondary | ICD-10-CM | POA: Insufficient documentation

## 2011-06-23 DIAGNOSIS — J209 Acute bronchitis, unspecified: Secondary | ICD-10-CM

## 2011-06-23 DIAGNOSIS — R05 Cough: Secondary | ICD-10-CM | POA: Insufficient documentation

## 2011-06-23 DIAGNOSIS — J449 Chronic obstructive pulmonary disease, unspecified: Secondary | ICD-10-CM | POA: Insufficient documentation

## 2011-06-23 DIAGNOSIS — J4489 Other specified chronic obstructive pulmonary disease: Secondary | ICD-10-CM | POA: Insufficient documentation

## 2011-06-23 DIAGNOSIS — R0989 Other specified symptoms and signs involving the circulatory and respiratory systems: Secondary | ICD-10-CM | POA: Insufficient documentation

## 2011-06-23 MED ORDER — ALBUTEROL SULFATE HFA 108 (90 BASE) MCG/ACT IN AERS: 1.0000 | INHALATION_SPRAY | Freq: Two times a day (BID) | RESPIRATORY_TRACT | Status: DC

## 2011-06-23 MED ORDER — PREDNISONE 10 MG PO TABS: ORAL_TABLET | ORAL | Status: DC

## 2011-06-23 MED ORDER — AZITHROMYCIN 250 MG PO TABS: 250.0000 mg | ORAL_TABLET | Freq: Every day | ORAL | Status: AC

## 2011-06-23 MED ORDER — FLUTICASONE-SALMETEROL 250-50 MCG/DOSE IN AEPB: 1.0000 | INHALATION_SPRAY | Freq: Two times a day (BID) | RESPIRATORY_TRACT | Status: DC

## 2011-06-23 NOTE — Assessment & Plan Note (Signed)
Acute tracheobronchitis with flare Note CXR 06/23/11 : copd changes, NAD Plan Azithromycin 250mg  Take two once then one daily until gone Prednisone 10mg  Take 4 for three days 3 for three days 2 for three days 1 for three days and then resume 5mg  prednisone Return  4 months,

## 2011-06-23 NOTE — Progress Notes (Signed)
Subjective:    Patient ID: [name] [name], male    DOB: [birth year], 78 y.o.   MRN: [id number]  HPI 78 y.o.  , white male with chronic obstructive lung disease.   06/23/2011 Not seen since 1/12 Since last OV notes for 5 days more dyspnea and cough that is dry.  Mucus there not coming out. No real chest pain.  Notes more wheezing.  Notes some pndrip.  No edema in feet.   Past Medical History  Diagnosis Date  . Peripheral vascular disease   . Osteoarthritis   . Hypertension   . Hyperlipidemia   . CAD (coronary artery disease)     Non stemi 3/11; PTCA first OM 3/11  . COPD (chronic obstructive pulmonary disease)   . History of colonic polyps   . Dementia      No family history on file.   History   Social History  . Marital Status: Married    Spouse Name: N/A    Number of Children: N/A  . Years of Education: N/A   Occupational History  . Part time, not physical work    Social History Main Topics  . Smoking status: Former Games developer  . Smokeless tobacco: Not on file   Comment: Remote use, quit 18 years ago  . Alcohol Use: 3.6 oz/week    6 Cans of beer per week     Denies binge drinking  . Drug Use: No  . Sexually Active: Not on file   Other Topics Concern  . Not on file   Social History Narrative   [place] wifeNo herbal medicationsRegular dietNo regular exercise     Allergies  Allergen Reactions  . Iohexol      Desc: Increased difficulty breathing, Onset Date: [id number]   . Penicillins   . Zolpidem Tartrate     REACTION: Acute delirium     Outpatient Prescriptions Prior to Visit  Medication Sig Dispense Refill  . amLODipine (NORVASC) 5 MG tablet take 1 tablet by mouth once daily  30 tablet  12  . aspirin 325 MG EC tablet Take 325 mg by mouth daily.        . bimatoprost (LUMIGAN) 0.03 % ophthalmic solution 1 drop daily.        . cholecalciferol (VITAMIN D) 1000 UNITS tablet Take 1,000 Units by mouth daily.        Marland Kitchen donepezil (ARICEPT) 23 MG  TABS tablet Take 23 mg by mouth at bedtime.        . nitroGLYCERIN (NITROSTAT) 0.4 MG SL tablet Place 0.4 mg under the tongue as needed.        . pravastatin (PRAVACHOL) 40 MG tablet Take 40 mg by mouth at bedtime.        . traZODone (DESYREL) 50 MG tablet Take 50 mg by mouth as needed.        Marland Kitchen ADVAIR DISKUS 250-50 MCG/DOSE AEPB inhale 1 dose by mouth twice a day  60 each  2  . PROAIR HFA 108 (90 BASE) MCG/ACT inhaler inhale 1 to 2 puffs every 4 to 6 hours if needed for shortness of breath  8.5 g  0  . vitamin B-12 (CYANOCOBALAMIN) 1000 MCG tablet Take 1,000 mcg by mouth daily.            Review of Systems Constitutional:   No  weight loss, night sweats,  Fevers, chills, fatigue, lassitude. HEENT:   No headaches,  Difficulty swallowing,  Tooth/dental problems,  Sore throat,  No sneezing, itching, ear ache, nasal congestion, post nasal drip,   CV:  No chest pain,  Orthopnea, PND, swelling in lower extremities, anasarca, dizziness, palpitations  GI  No heartburn, indigestion, abdominal pain, nausea, vomiting, diarrhea, change in bowel habits, loss of appetite  Resp: No shortness of breath with exertion or at rest.  No excess mucus, no productive cough,  No non-productive cough,  No coughing up of blood.  No change in color of mucus.  No wheezing.  No chest wall deformity  Skin: no rash or lesions.  GU: no dysuria, change in color of urine, no urgency or frequency.  No flank pain.  MS:  No joint pain or swelling.  No decreased range of motion.  No back pain.  Psych:  No change in mood or affect. No depression or anxiety.  No memory loss.     Objective:   Physical Exam  Filed Vitals:   06/23/11 1012  BP: 126/82  Pulse: 67  Temp: 98.3 F (36.8 C)  TempSrc: Oral  Height: 5\' 7"  (1.702 m)  Weight: 206 lb (93.441 kg)  SpO2: 92%    Gen: Pleasant, obese, in no distress,  normal affect  ENT: No lesions,  mouth clear,  oropharynx clear, no postnasal drip  Neck: No  JVD, no TMG, no carotid bruits  Lungs: No use of accessory muscles, no dullness to percussion, exp wheezes,  Cardiovascular: RRR, heart sounds normal, no murmur or gallops, no peripheral edema  Abdomen: soft and NT, no HSM,  BS normal  Musculoskeletal: No deformities, no cyanosis or clubbing  Neuro: alert, non focal  Skin: Warm, no lesions or rashes  Dg Chest 2 View  06/23/2011  *RADIOLOGY REPORT*  Clinical Data: Cough, congestion, COPD, hypertension  CHEST - 2 VIEW  Comparison: August 31, 2009 and May 14, 2009  Findings: There is persistent atelectasis and scarring within the right middle lobe and right lower lobe which is unchanged in appearance.  The left lung remains clear.  The cardiac silhouette, mediastinum, pulmonary vasculature are within normal limits.  IMPRESSION: There is no evidence of acute cardiac or pulmonary process.  Stable appearing atelectasis and scarring in the right middle and lower lobes.  Original Report Authenticated By: Brandon Melnick, M.D.          Assessment & Plan:   ASTHMATIC BRONCHITIS, ACUTE Acute tracheobronchitis with flare Note CXR 06/23/11 : copd changes, NAD Plan Azithromycin 250mg  Take two once then one daily until gone Prednisone 10mg  Take 4 for three days 3 for three days 2 for three days 1 for three days and then resume 5mg  prednisone Return  4 months,    Updated Medication List Outpatient Encounter Prescriptions as of 06/23/2011  Medication Sig Dispense Refill  . albuterol (PROAIR HFA) 108 (90 BASE) MCG/ACT inhaler Inhale 1 puff into the lungs 2 (two) times daily.  8.5 g  0  . amLODipine (NORVASC) 5 MG tablet take 1 tablet by mouth once daily  30 tablet  12  . aspirin 325 MG EC tablet Take 325 mg by mouth daily.        . bimatoprost (LUMIGAN) 0.03 % ophthalmic solution 1 drop daily.        . cholecalciferol (VITAMIN D) 1000 UNITS tablet Take 1,000 Units by mouth daily.        Marland Kitchen donepezil (ARICEPT) 23 MG TABS tablet Take 23 mg by  mouth at bedtime.        . Fluticasone-Salmeterol (ADVAIR DISKUS) 250-50 MCG/DOSE  AEPB Inhale 1 puff into the lungs 2 (two) times daily.  60 each  11  . leflunomide (ARAVA) 20 MG tablet Take 20 mg by mouth daily.      . nitroGLYCERIN (NITROSTAT) 0.4 MG SL tablet Place 0.4 mg under the tongue as needed.        . pravastatin (PRAVACHOL) 40 MG tablet Take 40 mg by mouth at bedtime.        . predniSONE (DELTASONE) 5 MG tablet Take 5 mg by mouth daily.      . traZODone (DESYREL) 50 MG tablet Take 50 mg by mouth as needed.        Marland Kitchen DISCONTD: ADVAIR DISKUS 250-50 MCG/DOSE AEPB inhale 1 dose by mouth twice a day  60 each  2  . DISCONTD: PROAIR HFA 108 (90 BASE) MCG/ACT inhaler inhale 1 to 2 puffs every 4 to 6 hours if needed for shortness of breath  8.5 g  0  . azithromycin (ZITHROMAX) 250 MG tablet Take 1 tablet (250 mg total) by mouth daily. Take two once then one daily until gone  6 each  0  . predniSONE (DELTASONE) 10 MG tablet Take 4 for three days 3 for three days 2 for three days 1 for three days and stop  30 tablet  0  . DISCONTD: vitamin B-12 (CYANOCOBALAMIN) 1000 MCG tablet Take 1,000 mcg by mouth daily.

## 2011-06-23 NOTE — Progress Notes (Signed)
Quick Note:  Notify the patient that the Xray is stable and no pneumonia No change in medications are recommended. Continue current meds as prescribed at last office visit ______ 

## 2011-06-23 NOTE — Patient Instructions (Signed)
Chest xray today Azithromycin 250mg  Take two once then one daily until gone Prednisone 10mg  Take 4 for three days 3 for three days 2 for three days 1 for three days and then resume 5mg  prednisone Return  4 months, sooner if unimproving

## 2011-06-25 ENCOUNTER — Other Ambulatory Visit: Payer: Self-pay | Admitting: Critical Care Medicine

## 2011-06-27 MED ORDER — FLUTICASONE-SALMETEROL 250-50 MCG/DOSE IN AEPB: 1.0000 | INHALATION_SPRAY | Freq: Two times a day (BID) | RESPIRATORY_TRACT | Status: DC

## 2011-06-27 NOTE — Telephone Encounter (Signed)
Addended by: Fenton Foy on: 06/27/2011 11:04 AM   Modules accepted: Orders

## 2011-07-08 NOTE — Telephone Encounter (Signed)
Received escribe error on Advair.  I called [facility] and spoke with pharmacist.  Stated they did receive the advair rx.  Pt has refills left and nothing further needed at this time.

## 2011-08-03 ENCOUNTER — Encounter (HOSPITAL_COMMUNITY): Payer: Self-pay | Admitting: Emergency Medicine

## 2011-08-03 ENCOUNTER — Emergency Department (HOSPITAL_COMMUNITY): Payer: Medicare Other

## 2011-08-03 ENCOUNTER — Emergency Department (HOSPITAL_COMMUNITY)
Admission: EM | Admit: 2011-08-03 | Discharge: 2011-08-03 | Disposition: A | Discharge status: Home / Self Care | Payer: Medicare Other | Attending: Emergency Medicine | Admitting: Emergency Medicine

## 2011-08-03 DIAGNOSIS — E785 Hyperlipidemia, unspecified: Secondary | ICD-10-CM | POA: Insufficient documentation

## 2011-08-03 DIAGNOSIS — M1990 Unspecified osteoarthritis, unspecified site: Secondary | ICD-10-CM | POA: Insufficient documentation

## 2011-08-03 DIAGNOSIS — Z860100 Personal history of colon polyps, unspecified: Secondary | ICD-10-CM | POA: Insufficient documentation

## 2011-08-03 DIAGNOSIS — Z7982 Long term (current) use of aspirin: Secondary | ICD-10-CM | POA: Insufficient documentation

## 2011-08-03 DIAGNOSIS — M25559 Pain in unspecified hip: Secondary | ICD-10-CM | POA: Insufficient documentation

## 2011-08-03 DIAGNOSIS — I2510 Atherosclerotic heart disease of native coronary artery without angina pectoris: Secondary | ICD-10-CM | POA: Insufficient documentation

## 2011-08-03 DIAGNOSIS — Z79899 Other long term (current) drug therapy: Secondary | ICD-10-CM | POA: Insufficient documentation

## 2011-08-03 DIAGNOSIS — I10 Essential (primary) hypertension: Secondary | ICD-10-CM | POA: Insufficient documentation

## 2011-08-03 DIAGNOSIS — IMO0002 Reserved for concepts with insufficient information to code with codable children: Secondary | ICD-10-CM | POA: Insufficient documentation

## 2011-08-03 DIAGNOSIS — I739 Peripheral vascular disease, unspecified: Secondary | ICD-10-CM | POA: Insufficient documentation

## 2011-08-03 DIAGNOSIS — J449 Chronic obstructive pulmonary disease, unspecified: Secondary | ICD-10-CM | POA: Insufficient documentation

## 2011-08-03 DIAGNOSIS — F0390 Unspecified dementia without behavioral disturbance: Secondary | ICD-10-CM | POA: Insufficient documentation

## 2011-08-03 DIAGNOSIS — J4489 Other specified chronic obstructive pulmonary disease: Secondary | ICD-10-CM | POA: Insufficient documentation

## 2011-08-03 HISTORY — DX: Dorsalgia, unspecified: M54.9

## 2011-08-03 MED ORDER — OXYCODONE-ACETAMINOPHEN 5-325 MG PO TABS
1.0000 | ORAL_TABLET | Freq: Once | ORAL | Status: AC
  Administered 2011-08-03: 1 via ORAL
  Filled 2011-08-03: qty 1

## 2011-08-03 MED ORDER — HYDROCODONE-ACETAMINOPHEN 5-500 MG PO TABS: 1.0000 | ORAL_TABLET | Freq: Four times a day (QID) | ORAL | Status: AC | PRN

## 2011-08-03 NOTE — Discharge Instructions (Signed)
Ice to hip and groin throughout the day for pain using hydrocodone-acetaminophen as directed, as needed for pain but do not drive or operate machinery with use. You may need an over the counter stool softener with pain medication use. Call [facility] today to schedule close follow up in the next 1-2 weeks for recheck of ongoing pain but return to ER for emergent changing or worsening of symptoms.   Arthralgia Your caregiver has diagnosed you as suffering from an arthralgia. Arthralgia means there is pain in a joint. This can come from many reasons including:  Bruising the joint which causes soreness (inflammation) in the joint.   Wear and tear on the joints which occur as we grow older (osteoarthritis).   Overusing the joint.   Various forms of arthritis.   Infections of the joint.  Regardless of the cause of pain in your joint, most of these different pains respond to anti-inflammatory drugs and rest. The exception to this is when a joint is infected, and these cases are treated with antibiotics, if it is a bacterial infection. HOME CARE INSTRUCTIONS   Rest the injured area for as long as directed by your caregiver. Then slowly start using the joint as directed by your caregiver and as the pain allows. Crutches as directed may be useful if the ankles, knees or hips are involved. If the knee was splinted or casted, continue use and care as directed. If an stretchy or elastic wrapping bandage has been applied today, it should be removed and re-applied every 3 to 4 hours. It should not be applied tightly, but firmly enough to keep swelling down. Watch toes and feet for swelling, bluish discoloration, coldness, numbness or excessive pain. If any of these problems (symptoms) occur, remove the ace bandage and re-apply more loosely. If these symptoms persist, contact your caregiver or return to this location.   For the first 24 hours, keep the injured extremity elevated on pillows while lying  down.   Apply ice for 15 to 20 minutes to the sore joint every couple hours while awake for the first half day. Then 3 to 4 times per day for the first 48 hours. Put the ice in a plastic bag and place a towel between the bag of ice and your skin.   Wear any splinting, casting, elastic bandage applications, or slings as instructed.   Only take over-the-counter or prescription medicines for pain, discomfort, or fever as directed by your caregiver. Do not use aspirin immediately after the injury unless instructed by your physician. Aspirin can cause increased bleeding and bruising of the tissues.   If you were given crutches, continue to use them as instructed and do not resume weight bearing on the sore joint until instructed.  Persistent pain and inability to use the sore joint as directed for more than 2 to 3 days are warning signs indicating that you should see a caregiver for a follow-up visit as soon as possible. Initially, a hairline fracture (break in bone) may not be evident on X-rays. Persistent pain and swelling indicate that further evaluation, non-weight bearing or use of the joint (use of crutches or slings as instructed), or further X-rays are indicated. X-rays may sometimes not show a small fracture until a week or 10 days later. Make a follow-up appointment with your own caregiver or one to whom we have referred you. A radiologist (specialist in reading X-rays) may read your X-rays. Make sure you know how you are to obtain your X-ray  results. Do not assume everything is normal if you do not hear from [place]. SEEK MEDICAL CARE IF: Bruising, swelling, or pain increases. SEEK IMMEDIATE MEDICAL CARE IF:   Your fingers or toes are numb or blue.   The pain is not responding to medications and continues to stay the same or get worse.   The pain in your joint becomes severe.   You develop a fever over 102 F (38.9 C).   It becomes impossible to move or use the joint.  MAKE SURE YOU:    Understand these instructions.   Will watch your condition.   Will get help right away if you are not doing well or get worse.  Document Released: 05/30/2005 Document Revised: 02/09/2011 Document Reviewed: 01/16/2008 [facility] Patient Information 2012 [place], [place].

## 2011-08-03 NOTE — ED Provider Notes (Signed)
Medical screening examination/treatment/procedure(s) were performed by non-physician practitioner and as supervising physician I was immediately available for consultation/collaboration.   Laray Anger, DO 08/03/11 1810

## 2011-08-03 NOTE — ED Provider Notes (Signed)
History     CSN: [id number]  Arrival date & time 08/03/11  1108   First MD Initiated Contact with Patient 08/03/11 1122      Chief Complaint  Patient presents with  . Hip Pain    (Consider location/radiation/quality/duration/timing/severity/associated sxs/prior Treatment) HPI  Patient presents to ER with wife at bedside with complaint of left hip pain x 3-4 days. Patient and wife states that patient has a long standing hx of night terrors and over the last 2 weeks has woken suddenly from sleep and "lept from bed and then fallen down onto hip." Patient states he last fell a week ago but noticed over the last 3-4 days gradual onset left hip pain with pain aggravating by standing and improved with lying down however states the pain is constant. Patient states he had some left over vicodin from previous injury and has taken a pill x 2 over the last few days with temporary improvement of pain. Denies fevers, chills, flank pain, abdominal pain, n/v/d, dysuria, hematuria or blood in stool. Patient states pain is in left lateral hip. Denies deformity or mass in groin. Denies hitting head, LOC or additional injury from fall. Patient is able to ambulate but states that bearing weight over time increases pain.   Past Medical History  Diagnosis Date  . Peripheral vascular disease   . Osteoarthritis   . Hypertension   . Hyperlipidemia   . CAD (coronary artery disease)     Non stemi 3/11; PTCA first OM 3/11  . COPD (chronic obstructive pulmonary disease)   . History of colonic polyps   . Dementia   . Back pain     Past Surgical History  Procedure Date  . Iliac artery stent     Stenting of left common iliac and external iliac  . Coronary angioplasty 08/2009    First OM    No family history on file.  History  Substance Use Topics  . Smoking status: Former Games developer  . Smokeless tobacco: Not on file   Comment: Remote use, quit 18 years ago  . Alcohol Use: 3.6 oz/week    6 Cans of beer  per week     Denies binge drinking      Review of Systems  Allergies  Iohexol; Penicillins; and Zolpidem tartrate  Home Medications   Current Outpatient Rx  Name Route Sig Dispense Refill  . ALBUTEROL SULFATE HFA 108 (90 BASE) MCG/ACT IN AERS Inhalation Inhale 1 puff into the lungs 2 (two) times daily. 8.5 g 0  . AMLODIPINE BESYLATE 5 MG PO TABS  take 1 tablet by mouth once daily 30 tablet 12  . ASPIRIN 325 MG PO TBEC Oral Take 325 mg by mouth daily.      Marland Kitchen BIMATOPROST 0.03 % OP SOLN  1 drop daily.      Marland Kitchen VITAMIN D 1000 UNITS PO TABS Oral Take 1,000 Units by mouth daily.      . DONEPEZIL HCL 23 MG PO TABS Oral Take 23 mg by mouth at bedtime.      Marland Kitchen FLUTICASONE-SALMETEROL 250-50 MCG/DOSE IN AEPB Inhalation Inhale 1 puff into the lungs 2 (two) times daily. 60 each 4  . LEFLUNOMIDE 20 MG PO TABS Oral Take 20 mg by mouth daily.    Marland Kitchen NITROGLYCERIN 0.4 MG SL SUBL Sublingual Place 0.4 mg under the tongue as needed.      Marland Kitchen PRAVASTATIN SODIUM 40 MG PO TABS Oral Take 40 mg by mouth at bedtime.      Marland Kitchen  PREDNISONE 10 MG PO TABS  Take 4 for three days 3 for three days 2 for three days 1 for three days and stop 30 tablet 0  . PREDNISONE 5 MG PO TABS Oral Take 5 mg by mouth daily.    Marland Kitchen PROAIR HFA 108 (90 BASE) MCG/ACT IN AERS  inhale 1 to 2 puffs every 4 to 6 hours if needed for shortness of breath 8.5 g 4  . TRAZODONE HCL 50 MG PO TABS Oral Take 50 mg by mouth as needed.        BP 160/79  Pulse 76  Temp(Src) 98.2 F (36.8 C) (Oral)  Resp 20  Ht 5\' 7"  (1.702 m)  Wt 205 lb (92.987 kg)  BMI 32.11 kg/m2  SpO2 96%  Physical Exam  Nursing note and vitals reviewed. Constitutional: He is oriented to person, place, and time. He appears well-developed and well-nourished. No distress.  HENT:  Head: Normocephalic and atraumatic.  Eyes: Conjunctivae are normal.  Neck: Normal range of motion. Neck supple.  Cardiovascular: Normal rate, regular rhythm, normal heart sounds and intact distal pulses.   Exam reveals no gallop and no friction rub.   No murmur heard. Pulmonary/Chest: Effort normal and breath sounds normal. No respiratory distress. He has no wheezes. He has no rales. He exhibits no tenderness.  Abdominal: Soft. Bowel sounds are normal. He exhibits no distension and no mass. There is no tenderness. There is no rebound and no guarding.  Genitourinary:       No TTP of bilateral testes. No inguinal mass or hernia  Musculoskeletal: Normal range of motion. He exhibits tenderness. He exhibits no edema.       Mild TTP of left lateral hip with palpation but no no pain with FROM of hip with ROM. No TTP or mass of left inguinal region. No erythema, swelling or crepitous.   Good femoral pulse bilaterally and pedal pulse  Neurological: He is alert and oriented to person, place, and time.  Skin: Skin is warm and dry. No rash noted. He is not diaphoretic. No erythema.  Psychiatric: He has a normal mood and affect.    ED Course  Procedures (including critical care time)  PO percocet  Labs Reviewed - No data to display Dg Hip Complete Left  08/03/2011  *RADIOLOGY REPORT*  Clinical Data: 78 year old male with falls.  Pain.  LEFT HIP - COMPLETE 2+ VIEW  Comparison: 04/13/2007.  Findings: Femoral heads are normally located.  Hip joint spaces are preserved.  Proximal left femur appears stable and intact.  Pelvis appears intact.  Advanced atherosclerosis.  Left iliac stent. Advanced lumbar spine degenerative changes.  IMPRESSION: No acute fracture or dislocation identified about the left hip or pelvis.  Original Report Authenticated By: Harley Hallmark, M.D.   Ct Hip Left Wo Contrast  08/03/2011  *RADIOLOGY REPORT*  Clinical Data: Left hip pain.  Fell 1 week ago.  CT OF THE LEFT HIP WITHOUT CONTRAST  Technique:  Multidetector CT imaging was performed according to the standard protocol. Multiplanar CT image reconstructions were also generated.  Comparison: Left hip radiographs 08/03/2011.  Findings:  The left hip joint is maintained.  Minimal degenerative changes for age.  No acute hip fracture.  No CT findings for avascular necrosis.  The acetabulum and pubic rami appear normal.  No significant soft tissue abnormality.  Vascular calcifications are noted.  No significant intrapelvic abnormalities are identified.  IMPRESSION: No acute bony findings or significant degenerative changes.  Original Report Authenticated By:  Cyndie Chime, M.D.     1. Hip pain       MDM  Patient is able to stand and ambulate complaining of mild discomfort pointing to lateral iliac crest but states pain improved after percocet. No acute findings on xray or CT hip. LLE neurovasc intact. Abdomen is soft and nontender. No inguinal TTP or hernia. Testes nontender.        Jenness Corner, [place] 08/03/11 1311

## 2011-08-03 NOTE — ED Notes (Signed)
Per pt/wife-has nightmares and is very physical when dreaming-fell out of bed last night onto left hip

## 2011-09-04 ENCOUNTER — Other Ambulatory Visit: Payer: Self-pay | Admitting: Cardiology

## 2011-09-12 IMAGING — CR DG CHEST 2V
2 series · 2 of 2 positions shown · non-contrast
Comparison: 02/16/2009 and 06/26/2007

CLINICAL DATA: Acute asthmatic bronchitis.  Cough.  COPD.

CHEST - 2 VIEW

[w chest pa]
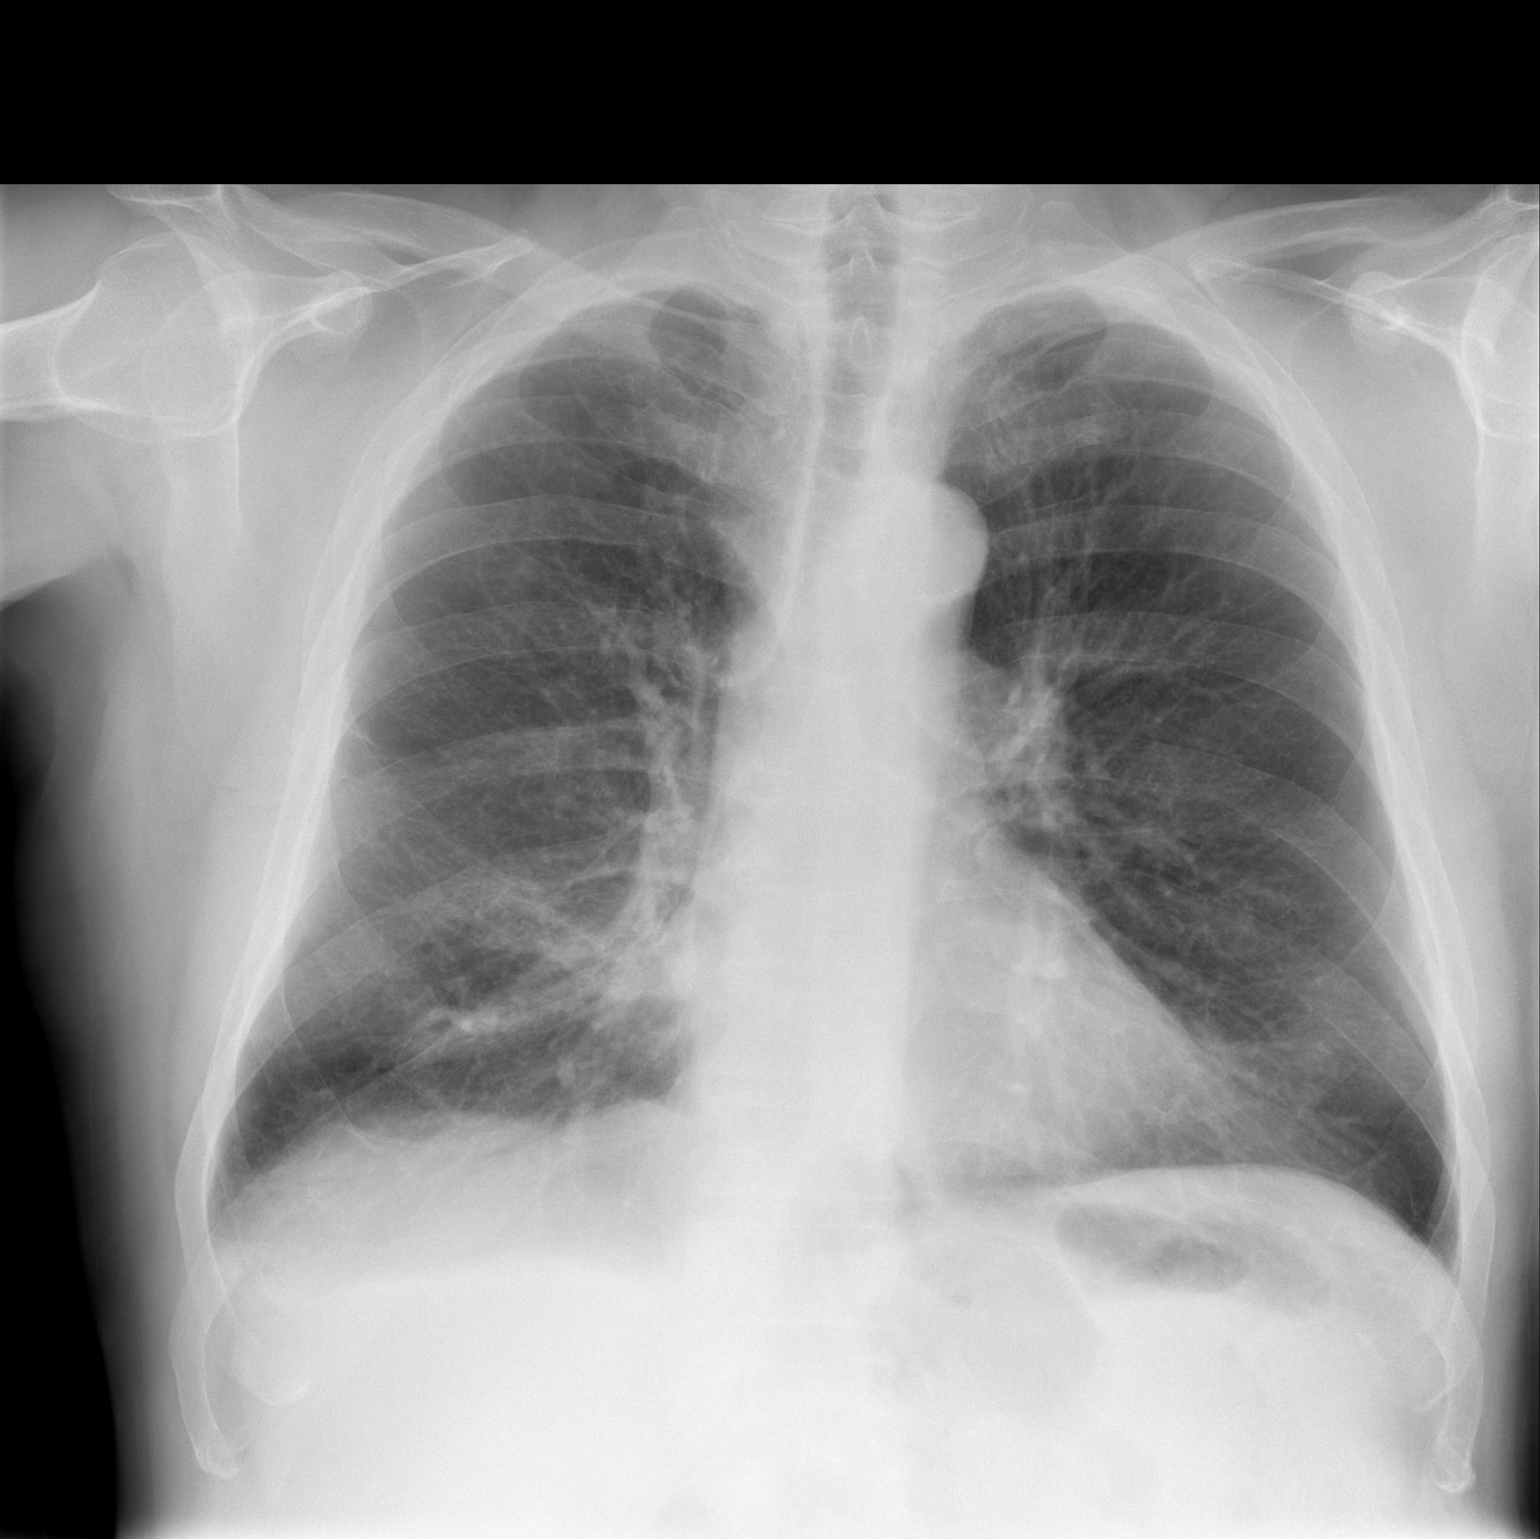

[w chest lat]
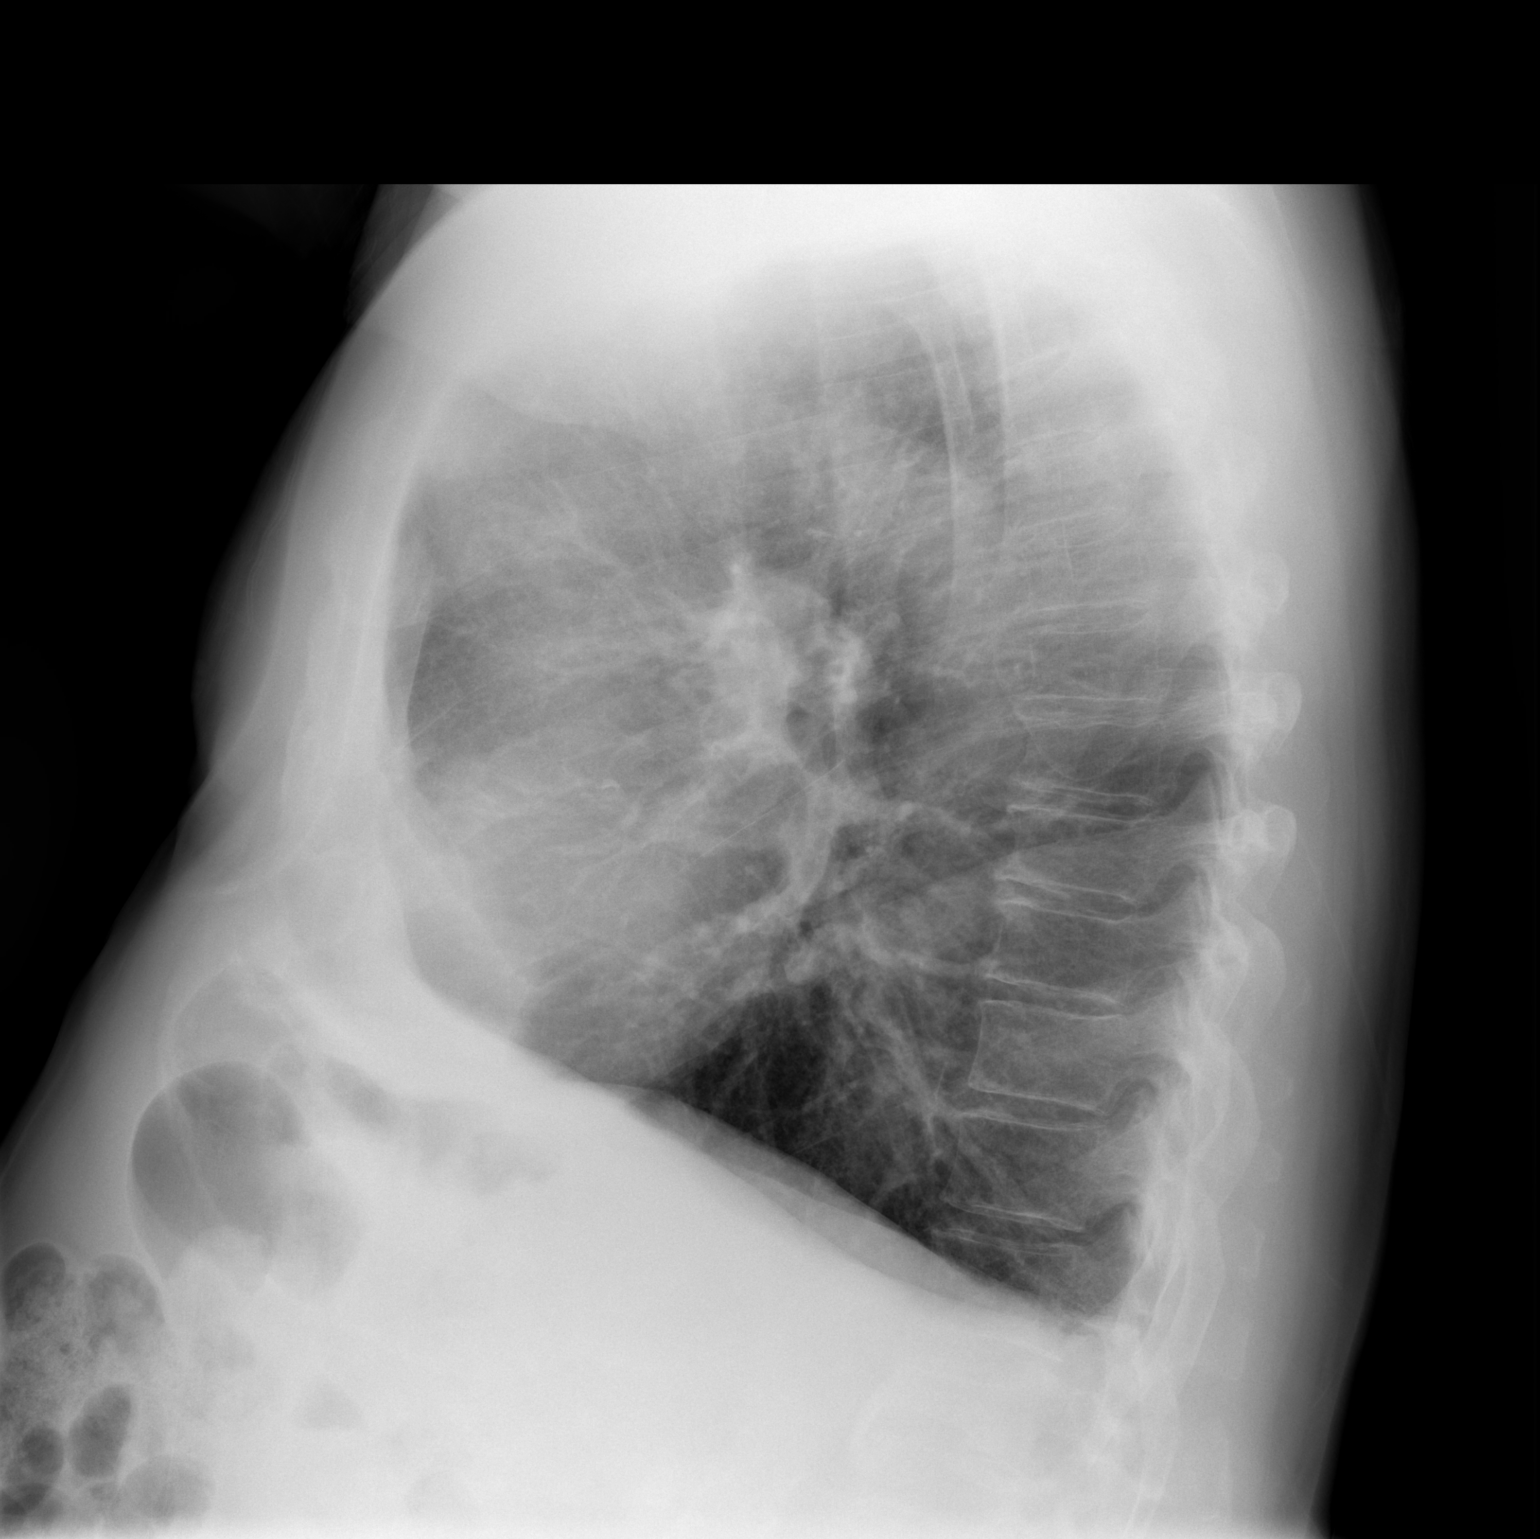

[2 of 2 positions shown; findings below may reference images not displayed]

FINDINGS: Changes of COPD are again seen.  Right lower lung
scarring is stable.  There is no evidence of acute infiltrate or
edema.  There is no evidence of pleural effusion.  No mass or
adenopathy identified.  Heart size is normal.
IMPRESSION: COPD.  No active disease.

## 2011-09-22 ENCOUNTER — Other Ambulatory Visit: Payer: Self-pay | Admitting: *Deleted

## 2011-09-22 MED ORDER — ALBUTEROL SULFATE HFA 108 (90 BASE) MCG/ACT IN AERS: 2.0000 | INHALATION_SPRAY | Freq: Four times a day (QID) | RESPIRATORY_TRACT | Status: DC | PRN

## 2011-09-22 NOTE — Telephone Encounter (Signed)
RX sent

## 2012-01-03 ENCOUNTER — Other Ambulatory Visit: Payer: Self-pay | Admitting: Critical Care Medicine

## 2012-01-08 ENCOUNTER — Other Ambulatory Visit: Payer: Self-pay | Admitting: Cardiology

## 2012-01-19 ENCOUNTER — Ambulatory Visit (INDEPENDENT_AMBULATORY_CARE_PROVIDER_SITE_OTHER): Payer: Medicare Other | Admitting: Critical Care Medicine

## 2012-01-19 ENCOUNTER — Encounter: Payer: Self-pay | Admitting: Critical Care Medicine

## 2012-01-19 VITALS — BP 128/80 | HR 64 | Temp 98.1°F | Ht 67.0 in | Wt 187.0 lb

## 2012-01-19 DIAGNOSIS — J449 Chronic obstructive pulmonary disease, unspecified: Secondary | ICD-10-CM

## 2012-01-19 DIAGNOSIS — F068 Other specified mental disorders due to known physiological condition: Secondary | ICD-10-CM

## 2012-01-19 DIAGNOSIS — F0280 Dementia in other diseases classified elsewhere without behavioral disturbance: Secondary | ICD-10-CM

## 2012-01-19 MED ORDER — ALBUTEROL SULFATE HFA 108 (90 BASE) MCG/ACT IN AERS: 2.0000 | INHALATION_SPRAY | RESPIRATORY_TRACT | Status: DC | PRN

## 2012-01-19 MED ORDER — PREDNISONE 5 MG PO TABS: ORAL_TABLET | ORAL | Status: DC

## 2012-01-19 MED ORDER — FLUTICASONE-SALMETEROL 250-50 MCG/DOSE IN AEPB: 1.0000 | INHALATION_SPRAY | Freq: Two times a day (BID) | RESPIRATORY_TRACT | Status: DC

## 2012-01-19 NOTE — Patient Instructions (Addendum)
Reduce prednisone to 2.5mg  every other day for two weeks then stop Stay on Advair for now Return 6 months

## 2012-01-19 NOTE — Progress Notes (Signed)
Subjective:    Patient ID: [name] [name], male    DOB: [birth year], 78 y.o.   MRN: [id number]  HPI  78 y.o.  , white male with chronic obstructive lung disease.   01/19/2012 No change in dyspnea since 1/13.  Not much cough.  No chest pain.  No mucus.   Pt denies any significant sore throat, nasal congestion or excess secretions, fever, chills, sweats, unintended weight loss, pleurtic or exertional chest pain, orthopnea PND, or leg swelling Pt denies any increase in rescue therapy over baseline, denies waking up needing it or having any early am or nocturnal exacerbations of coughing/wheezing/or dyspnea. Pt also denies any obvious fluctuation in symptoms with  weather or environmental change or other alleviating or aggravating factors     Past Medical History  Diagnosis Date  . Peripheral vascular disease   . Osteoarthritis   . Hypertension   . Hyperlipidemia   . CAD (coronary artery disease)     Non stemi 3/11; PTCA first OM 3/11  . COPD (chronic obstructive pulmonary disease)   . History of colonic polyps   . Dementia   . Back pain      No family history on file.   History   Social History  . Marital Status: Married    Spouse Name: N/A    Number of Children: N/A  . Years of Education: N/A   Occupational History  . Part time, not physical work    Social History Main Topics  . Smoking status: Former Smoker -- 1.5 packs/day for 30 years    Types: Cigarettes    Quit date: 06/13/1985  . Smokeless tobacco: Never Used  . Alcohol Use: 3.6 oz/week    6 Cans of beer per week     Denies binge drinking  . Drug Use: No  . Sexually Active: Not on file   Other Topics Concern  . Not on file   Social History Narrative   [place] in [place] with wifeNo herbal medicationsRegular dietNo regular exercise     Allergies  Allergen Reactions  . Iohexol      Desc: Increased difficulty breathing, Onset Date: [id number]   . Penicillins   . Zolpidem Tartrate     REACTION: Acute  delirium     Outpatient Prescriptions Prior to Visit  Medication Sig Dispense Refill  . amLODipine (NORVASC) 5 MG tablet take 1 tablet by mouth once daily  30 tablet  12  . aspirin 325 MG EC tablet Take 325 mg by mouth daily.        . cholecalciferol (VITAMIN D) 1000 UNITS tablet Take 1,000 Units by mouth daily.        Marland Kitchen ibuprofen (ADVIL,MOTRIN) 200 MG tablet Take 400 mg by mouth every 8 (eight) hours as needed. For pain.      . nitroGLYCERIN (NITROSTAT) 0.4 MG SL tablet Place 0.4 mg under the tongue every 5 (five) minutes x 3 doses as needed. For chest pain.      . pravastatin (PRAVACHOL) 40 MG tablet take 1 tablet by mouth at bedtime  30 tablet  12  . traZODone (DESYREL) 50 MG tablet Take 50 mg by mouth at bedtime as needed. For sleep.      Marland Kitchen Fluticasone-Salmeterol (ADVAIR DISKUS) 250-50 MCG/DOSE AEPB Inhale 1 puff into the lungs 2 (two) times daily.  60 each  4  . predniSONE (DELTASONE) 5 MG tablet Take 2.5 mg by mouth daily.       Marland Kitchen PROAIR HFA 108 (90  BASE) MCG/ACT inhaler inhale 2 puffs by mouth every 6 hours if needed for wheezing  1 Inhaler  2  . donepezil (ARICEPT) 23 MG TABS tablet Take 23 mg by mouth at bedtime.        Marland Kitchen leflunomide (ARAVA) 20 MG tablet Take 20 mg by mouth daily.      . naproxen sodium (ANAPROX) 220 MG tablet Take 440 mg by mouth 2 (two) times daily as needed. For pain.          Review of Systems  Constitutional:   No  weight loss, night sweats,  Fevers, chills, fatigue, lassitude. HEENT:   No headaches,  Difficulty swallowing,  Tooth/dental problems,  Sore throat,                No sneezing, itching, ear ache, nasal congestion, post nasal drip,   CV:  No chest pain,  Orthopnea, PND, swelling in lower extremities, anasarca, dizziness, palpitations  GI  No heartburn, indigestion, abdominal pain, nausea, vomiting, diarrhea, change in bowel habits, loss of appetite  Resp: No shortness of breath with exertion or at rest.  No excess mucus, no productive cough,   No non-productive cough,  No coughing up of blood.  No change in color of mucus.  No wheezing.  No chest wall deformity  Skin: no rash or lesions.  GU: no dysuria, change in color of urine, no urgency or frequency.  No flank pain.  MS:  No joint pain or swelling.  No decreased range of motion.  No back pain.  Psych:  No change in mood or affect. No depression or anxiety.  No memory loss.     Objective:   Physical Exam   Filed Vitals:   01/19/12 1350  BP: 128/80  Pulse: 64  Temp: 98.1 F (36.7 C)  TempSrc: Oral  Height: 5\' 7"  (1.702 m)  Weight: 187 lb (84.823 kg)  SpO2: 94%    Gen: Pleasant, obese, in no distress,  normal affect  ENT: No lesions,  mouth clear,  oropharynx clear, no postnasal drip  Neck: No JVD, no TMG, no carotid bruits  Lungs: No use of accessory muscles, no dullness to percussion, exp wheezes,  Cardiovascular: RRR, heart sounds normal, no murmur or gallops, no peripheral edema  Abdomen: soft and NT, no HSM,  BS normal  Musculoskeletal: No deformities, no cyanosis or clubbing  Neuro: alert, non focal  Skin: Warm, no lesions or rashes  No results found.        Assessment & Plan:   COPD Gold stage C. COPD with asthmatic bronchitic component Plan Take prednisone 10mg  Take 4 tablets daily for 5 days then  Resume one daily. Azithromycin 250mg  Take two once then one daily until gone Stop Levaquin No other medication changes Return 2 months      Updated Medication List Outpatient Encounter Prescriptions as of 01/19/2012  Medication Sig Dispense Refill  . albuterol (PROAIR HFA) 108 (90 BASE) MCG/ACT inhaler Inhale 2 puffs into the lungs every 4 (four) hours as needed for wheezing.  1 Inhaler  4  . amLODipine (NORVASC) 5 MG tablet take 1 tablet by mouth once daily  30 tablet  12  . aspirin 325 MG EC tablet Take 325 mg by mouth daily.        . cholecalciferol (VITAMIN D) 1000 UNITS tablet Take 1,000 Units by mouth daily.        Marland Kitchen donepezil  (ARICEPT) 76 MG tablet Take 10 mg by mouth daily.      Marland Kitchen  Fluticasone-Salmeterol (ADVAIR DISKUS) 250-50 MCG/DOSE AEPB Inhale 1 puff into the lungs 2 (two) times daily.  60 each  11  . ibuprofen (ADVIL,MOTRIN) 200 MG tablet Take 400 mg by mouth every 8 (eight) hours as needed. For pain.      . nitroGLYCERIN (NITROSTAT) 0.4 MG SL tablet Place 0.4 mg under the tongue every 5 (five) minutes x 3 doses as needed. For chest pain.      . pravastatin (PRAVACHOL) 40 MG tablet take 1 tablet by mouth at bedtime  30 tablet  12  . predniSONE (DELTASONE) 5 MG tablet Take 1/2 every other day for two weeks then stop      . traZODone (DESYREL) 50 MG tablet Take 50 mg by mouth at bedtime as needed. For sleep.      . vitamin E 1000 UNIT capsule Take 1,000 Units by mouth daily.      Marland Kitchen DISCONTD: Fluticasone-Salmeterol (ADVAIR DISKUS) 250-50 MCG/DOSE AEPB Inhale 1 puff into the lungs 2 (two) times daily.  60 each  4  . DISCONTD: predniSONE (DELTASONE) 5 MG tablet Take 2.5 mg by mouth daily.       Marland Kitchen DISCONTD: PROAIR HFA 108 (90 BASE) MCG/ACT inhaler inhale 2 puffs by mouth every 6 hours if needed for wheezing  1 Inhaler  2  . DISCONTD: donepezil (ARICEPT) 23 MG TABS tablet Take 23 mg by mouth at bedtime.        Marland Kitchen DISCONTD: leflunomide (ARAVA) 20 MG tablet Take 20 mg by mouth daily.      Marland Kitchen DISCONTD: naproxen sodium (ANAPROX) 220 MG tablet Take 440 mg by mouth 2 (two) times daily as needed. For pain.

## 2012-01-20 NOTE — Assessment & Plan Note (Signed)
Gold stage C. COPD with asthmatic bronchitic component Plan Take prednisone 10mg  Take 4 tablets daily for 5 days then  Resume one daily. Azithromycin 250mg  Take two once then one daily until gone Stop Levaquin No other medication changes Return 2 months

## 2012-03-14 ENCOUNTER — Ambulatory Visit (INDEPENDENT_AMBULATORY_CARE_PROVIDER_SITE_OTHER): Payer: Medicare Other | Admitting: Adult Health

## 2012-03-14 ENCOUNTER — Encounter: Payer: Self-pay | Admitting: Adult Health

## 2012-03-14 VITALS — BP 122/72 | HR 76 | Temp 97.2°F | Ht 67.0 in | Wt 191.8 lb

## 2012-03-14 DIAGNOSIS — J449 Chronic obstructive pulmonary disease, unspecified: Secondary | ICD-10-CM

## 2012-03-14 MED ORDER — DOXYCYCLINE HYCLATE 100 MG PO TABS: 100.0000 mg | ORAL_TABLET | Freq: Two times a day (BID) | ORAL | Status: AC

## 2012-03-14 MED ORDER — LEVALBUTEROL HCL 0.63 MG/3ML IN NEBU
0.6300 mg | INHALATION_SOLUTION | Freq: Once | RESPIRATORY_TRACT | Status: AC
  Administered 2012-03-14: 0.63 mg via RESPIRATORY_TRACT

## 2012-03-14 MED ORDER — PREDNISONE 10 MG PO TABS: ORAL_TABLET | ORAL | Status: DC

## 2012-03-14 NOTE — Assessment & Plan Note (Signed)
Exacerbation   Plan Doxycycline 100mg  Twice daily  For 7 days  Mucinex DM Twice daily  As needed  Cough/congestion  Fluids and rest  Prednisone taper over next week.  Please contact office for sooner follow up if symptoms do not improve or worsen or seek emergency care  Follow up Dr. Delford Field  As planned and As needed

## 2012-03-14 NOTE — Progress Notes (Signed)
  Subjective:    Patient ID: [name] [name], male    DOB: 27-Aug-1931, 78 y.o.   MRN: [id number]  HPI 78 yo male with known hx of COPD   03/14/2012 Acute OV  Complains of no appetite, increased sleepiness, prod cough with white mucus, increased SOB, wheezing x3days  No vomitting or fever.  No otc used.  Wife is with him today  No chest pain, edema or hemotpysis   Takes Advair regularly without missed doses.    Review of Systems Constitutional:   No  weight loss, night sweats,  Fevers, chills,  +fatigue, or  lassitude.  HEENT:   No headaches,  Difficulty swallowing,  Tooth/dental problems, or  Sore throat,                No sneezing, itching, ear ache, nasal congestion, post nasal drip,   CV:  No chest pain,  Orthopnea, PND, swelling in lower extremities, anasarca, dizziness, palpitations, syncope.   GI  No heartburn, indigestion, abdominal pain, nausea, vomiting, diarrhea, change in bowel habits, loss of appetite, bloody stools.    RESP  No chest wall deformity  Skin: no rash or lesions.  GU: no dysuria, change in color of urine, no urgency or frequency.  No flank pain, no hematuria   MS:  No joint pain or swelling.  No decreased range of motion.  No back pain.  Psych:  No change in mood or affect. No depression or anxiety.  No memory loss.         Objective:   Physical Exam GEN: A/Ox3; pleasant , NAD, well nourished   HEENT:  [place]/AT,  EACs-clear, TMs-wnl, NOSE-clear, THROAT-clear, no lesions, no postnasal drip or exudate noted.   NECK:  Supple w/ fair ROM; no JVD; normal carotid impulses w/o bruits; no thyromegaly or nodules palpated; no lymphadenopathy.  RESP  Coarse BS w/ few exp wheezes. no accessory muscle use, no dullness to percussion  CARD:  RRR, no m/r/g  , no peripheral edema, pulses intact, no cyanosis or clubbing.  GI:   Soft & nt; nml bowel sounds; no organomegaly or masses detected.  Musco: Warm bil, no deformities or joint swelling noted.   Neuro:  alert, no focal deficits noted.    Skin: Warm, no lesions or rashes  '       Assessment & Plan:

## 2012-03-14 NOTE — Patient Instructions (Addendum)
Doxycycline 100mg  Twice daily  For 7 days  Mucinex DM Twice daily  As needed  Cough/congestion  Fluids and rest  Prednisone taper over next week.  Please contact office for sooner follow up if symptoms do not improve or worsen or seek emergency care  Follow up Dr. Delford Field  As planned and As needed

## 2012-03-14 NOTE — Addendum Note (Signed)
Addended by: Abigail Miyamoto D on: 03/14/2012 11:18 AM   Modules accepted: Orders

## 2012-04-23 ENCOUNTER — Encounter: Payer: Self-pay | Admitting: Critical Care Medicine

## 2012-04-23 ENCOUNTER — Ambulatory Visit (INDEPENDENT_AMBULATORY_CARE_PROVIDER_SITE_OTHER): Payer: Medicare Other | Admitting: Critical Care Medicine

## 2012-04-23 VITALS — BP 128/60 | HR 68 | Temp 97.7°F | Ht 67.0 in | Wt 196.0 lb

## 2012-04-23 DIAGNOSIS — I208 Other forms of angina pectoris: Secondary | ICD-10-CM

## 2012-04-23 DIAGNOSIS — Z23 Encounter for immunization: Secondary | ICD-10-CM

## 2012-04-23 DIAGNOSIS — J449 Chronic obstructive pulmonary disease, unspecified: Secondary | ICD-10-CM

## 2012-04-23 MED ORDER — ROFLUMILAST 500 MCG PO TABS: ORAL_TABLET | ORAL | Status: DC

## 2012-04-23 NOTE — Assessment & Plan Note (Signed)
Stage B. COPD with frequent exacerbations stable at this time Plan Flu vaccine Start Daliresp:  One three times a week (M W sat) for one week then one every other day for a week then daily Stay on advair Return 6 weeks

## 2012-04-23 NOTE — Patient Instructions (Addendum)
Flu vaccine Start Daliresp:  One three times a week (M W sat) for one week then one every other day for a week then daily Stay on advair Return 6 weeks Flu vaccine today

## 2012-04-23 NOTE — Progress Notes (Signed)
Subjective:    Patient ID: [name] [name], male    DOB: 03-01-1932, 78 y.o.   MRN: [id number]  HPI  78 yo male with known hx of COPD   03/13/2012 Acute OV  Complains of no appetite, increased sleepiness, prod cough with white mucus, increased SOB, wheezing x3days  No vomitting or fever.  No otc used.  Wife is with him today  No chest pain, edema or hemotpysis   Takes Advair regularly without missed doses.   04/23/2012 Pt seen by TP 10/1 for exacerbation Rx doxy and pred pulse Pt still on advair.  Now just white mucus.  No real chest pain.  No real wheeze. No emesis. No hemoptysis.   Not as sleepy Pt denies any significant sore throat, nasal congestion or excess secretions, fever, chills, sweats, unintended weight loss, pleurtic or exertional chest pain, orthopnea PND, or leg swelling Pt denies any increase in rescue therapy over baseline, denies waking up needing it or having any early am or nocturnal exacerbations of coughing/wheezing/or dyspnea. Pt also denies any obvious fluctuation in symptoms with  weather or environmental change or other alleviating or aggravating factors   Review of Systems  Constitutional:   No  weight loss, night sweats,  Fevers, chills,  +fatigue, or  lassitude.  HEENT:   No headaches,  Difficulty swallowing,  Tooth/dental problems, or  Sore throat,                No sneezing, itching, ear ache, nasal congestion, post nasal drip,   CV:  No chest pain,  Orthopnea, PND, swelling in lower extremities, anasarca, dizziness, palpitations, syncope.   GI  No heartburn, indigestion, abdominal pain, nausea, vomiting, diarrhea, change in bowel habits, loss of appetite, bloody stools.    RESP  No chest wall deformity  Skin: no rash or lesions.  GU: no dysuria, change in color of urine, no urgency or frequency.  No flank pain, no hematuria   MS:  No joint pain or swelling.  No decreased range of motion.  No back pain.  Psych:  No change in mood or affect.  No depression or anxiety.  No memory loss.         Objective:   Physical Exam BP 128/60  Pulse 68  Temp 97.7 F (36.5 C) (Oral)  Ht 5\' 7"  (1.702 m)  Wt 196 lb (88.905 kg)  BMI 30.70 kg/m2  SpO2 96%  GEN: A/Ox3; pleasant , NAD, well nourished   HEENT:  [place]/AT,  EACs-clear, TMs-wnl, NOSE-clear, THROAT-clear, no lesions, no postnasal drip or exudate noted.   NECK:  Supple w/ fair ROM; no JVD; normal carotid impulses w/o bruits; no thyromegaly or nodules palpated; no lymphadenopathy.  RESP  Coarse BS w/ no wheezes. no accessory muscle use, no dullness to percussion  CARD:  RRR, no m/r/g  , no peripheral edema, pulses intact, no cyanosis or clubbing.  GI:   Soft & nt; nml bowel sounds; no organomegaly or masses detected.  Musco: Warm bil, no deformities or joint swelling noted.   Neuro: alert, no focal deficits noted.    Skin: Warm, no lesions or rashes  '       Assessment & Plan:   COPD Stage B. COPD with frequent exacerbations stable at this time Plan Flu vaccine Start Daliresp:  One three times a week (M W sat) for one week then one every other day for a week then daily Stay on advair Return 6 weeks     Updated  Medication List Outpatient Encounter Prescriptions as of 04/23/2012  Medication Sig Dispense Refill  . albuterol (PROAIR HFA) 108 (90 BASE) MCG/ACT inhaler Inhale 2 puffs into the lungs every 4 (four) hours as needed for wheezing.  1 Inhaler  4  . amLODipine (NORVASC) 5 MG tablet take 1 tablet by mouth once daily  30 tablet  12  . aspirin 325 MG EC tablet Take 325 mg by mouth daily.        . cholecalciferol (VITAMIN D) 1000 UNITS tablet Take 1,000 Units by mouth daily.        Marland Kitchen donepezil (ARICEPT) 10 MG tablet Take 10 mg by mouth daily.      . famotidine (PEPCID) 20 MG tablet Take 20 mg by mouth as needed.      . Fluticasone-Salmeterol (ADVAIR DISKUS) 250-50 MCG/DOSE AEPB Inhale 1 puff into the lungs 2 (two) times daily.  60 each  11  . ibuprofen  (ADVIL,MOTRIN) 200 MG tablet Take 400 mg by mouth every 8 (eight) hours as needed. For pain.      . nitroGLYCERIN (NITROSTAT) 0.4 MG SL tablet Place 0.4 mg under the tongue every 5 (five) minutes x 3 doses as needed. For chest pain.      . pravastatin (PRAVACHOL) 40 MG tablet take 1 tablet by mouth at bedtime  30 tablet  12  . traZODone (DESYREL) 50 MG tablet Take 50 mg by mouth at bedtime as needed. For sleep.      . roflumilast (DALIRESP) 500 MCG TABS tablet Take one three times a week : Monday  Wednesday Saturday for one week then one every other day for a week then one daily  30 tablet  6  . [DISCONTINUED] predniSONE (DELTASONE) 10 MG tablet 4 tabs for 2 days, then 3 tabs for 2 days, 2 tabs for 2 days, then 1 tab for 2 days, then stop  20 tablet  0

## 2012-04-25 ENCOUNTER — Emergency Department (HOSPITAL_COMMUNITY)
Admission: EM | Admit: 2012-04-25 | Discharge: 2012-04-25 | Disposition: A | Discharge status: Home / Self Care | Payer: Medicare Other | Attending: Emergency Medicine | Admitting: Emergency Medicine

## 2012-04-25 ENCOUNTER — Telehealth: Payer: Self-pay | Admitting: Critical Care Medicine

## 2012-04-25 ENCOUNTER — Emergency Department (HOSPITAL_COMMUNITY): Payer: Medicare Other

## 2012-04-25 ENCOUNTER — Encounter (HOSPITAL_COMMUNITY): Payer: Self-pay | Admitting: Neurology

## 2012-04-25 DIAGNOSIS — J449 Chronic obstructive pulmonary disease, unspecified: Secondary | ICD-10-CM | POA: Insufficient documentation

## 2012-04-25 DIAGNOSIS — Z8669 Personal history of other diseases of the nervous system and sense organs: Secondary | ICD-10-CM | POA: Insufficient documentation

## 2012-04-25 DIAGNOSIS — L0390 Cellulitis, unspecified: Secondary | ICD-10-CM

## 2012-04-25 DIAGNOSIS — M1990 Unspecified osteoarthritis, unspecified site: Secondary | ICD-10-CM | POA: Insufficient documentation

## 2012-04-25 DIAGNOSIS — E785 Hyperlipidemia, unspecified: Secondary | ICD-10-CM | POA: Insufficient documentation

## 2012-04-25 DIAGNOSIS — M25579 Pain in unspecified ankle and joints of unspecified foot: Secondary | ICD-10-CM | POA: Insufficient documentation

## 2012-04-25 DIAGNOSIS — Z79899 Other long term (current) drug therapy: Secondary | ICD-10-CM | POA: Insufficient documentation

## 2012-04-25 DIAGNOSIS — I10 Essential (primary) hypertension: Secondary | ICD-10-CM | POA: Insufficient documentation

## 2012-04-25 DIAGNOSIS — M79671 Pain in right foot: Secondary | ICD-10-CM

## 2012-04-25 DIAGNOSIS — Z7982 Long term (current) use of aspirin: Secondary | ICD-10-CM | POA: Insufficient documentation

## 2012-04-25 DIAGNOSIS — L02619 Cutaneous abscess of unspecified foot: Secondary | ICD-10-CM | POA: Insufficient documentation

## 2012-04-25 DIAGNOSIS — Z860100 Personal history of colon polyps, unspecified: Secondary | ICD-10-CM | POA: Insufficient documentation

## 2012-04-25 DIAGNOSIS — I2510 Atherosclerotic heart disease of native coronary artery without angina pectoris: Secondary | ICD-10-CM | POA: Insufficient documentation

## 2012-04-25 DIAGNOSIS — Z87891 Personal history of nicotine dependence: Secondary | ICD-10-CM | POA: Insufficient documentation

## 2012-04-25 DIAGNOSIS — J4489 Other specified chronic obstructive pulmonary disease: Secondary | ICD-10-CM | POA: Insufficient documentation

## 2012-04-25 LAB — COMPREHENSIVE METABOLIC PANEL
Alkaline Phosphatase: 82 U/L (ref 39–117)
BUN: 22 mg/dL (ref 6–23)
GFR calc Af Amer: 68 mL/min — ABNORMAL LOW (ref >90)
Glucose, Bld: 112 mg/dL — ABNORMAL HIGH (ref 70–99)
Potassium: 4.4 mEq/L (ref 3.5–5.1)
Total Protein: 8 g/dL (ref 6.0–8.3)

## 2012-04-25 LAB — CBC WITH DIFFERENTIAL/PLATELET
Basophils Absolute: 0 10*3/uL (ref 0.0–0.1)
Basophils Relative: 0 % (ref 0–1)
Eosinophils Absolute: 0.2 10*3/uL (ref 0.0–0.7)
MCHC: 33.8 g/dL (ref 30.0–36.0)
Monocytes Absolute: 1.3 10*3/uL — ABNORMAL HIGH (ref 0.1–1.0)
Neutro Abs: 10.7 10*3/uL — ABNORMAL HIGH (ref 1.7–7.7)
Neutrophils Relative %: 77 % (ref 43–77)
RDW: 14.2 % (ref 11.5–15.5)

## 2012-04-25 MED ORDER — SODIUM CHLORIDE 0.9 % IV BOLUS (SEPSIS)
1000.0000 mL | Freq: Once | INTRAVENOUS | Status: AC
  Administered 2012-04-25: 1000 mL via INTRAVENOUS

## 2012-04-25 MED ORDER — CLINDAMYCIN PHOSPHATE 600 MG/50ML IV SOLN
600.0000 mg | Freq: Once | INTRAVENOUS | Status: AC
  Administered 2012-04-25: 600 mg via INTRAVENOUS
  Filled 2012-04-25: qty 50

## 2012-04-25 MED ORDER — HYDROCODONE-ACETAMINOPHEN 5-325 MG PO TABS
2.0000 | ORAL_TABLET | Freq: Once | ORAL | Status: AC
  Administered 2012-04-25: 2 via ORAL
  Filled 2012-04-25: qty 2

## 2012-04-25 MED ORDER — MORPHINE SULFATE 4 MG/ML IJ SOLN: 4.0000 mg | Freq: Once | INTRAMUSCULAR | Status: DC

## 2012-04-25 MED ORDER — CLINDAMYCIN HCL 300 MG PO CAPS: 300.0000 mg | ORAL_CAPSULE | Freq: Four times a day (QID) | ORAL | Status: DC

## 2012-04-25 MED ORDER — HYDROCODONE-ACETAMINOPHEN 5-325 MG PO TABS: 2.0000 | ORAL_TABLET | ORAL | Status: DC | PRN

## 2012-04-25 MED ORDER — IBUPROFEN 800 MG PO TABS
800.0000 mg | ORAL_TABLET | Freq: Once | ORAL | Status: AC
  Administered 2012-04-25: 800 mg via ORAL
  Filled 2012-04-25: qty 1

## 2012-04-25 NOTE — Progress Notes (Signed)
Orthopedic Tech Progress Note Patient Details:  [name] [name] 28-Dec-1931 [id number]  Ortho Devices Type of Ortho Device: CAM walker Ortho Device/Splint Location: right foot Ortho Device/Splint Interventions: Application   Alton Bouknight 04/25/2012, 11:02 PM

## 2012-04-25 NOTE — ED Notes (Signed)
Pt says that is feeling tender and weak in his right foot.No history of fall or trauma.

## 2012-04-25 NOTE — Telephone Encounter (Signed)
Member ID # [id number].Daliresp PA initiated with [facility] and is under clinical review. They will fax there decision within 24 to 72 hours. I will forward this to Crystal for follow-up.

## 2012-04-25 NOTE — ED Notes (Signed)
I gave the patients visitor a cup of ice water. 

## 2012-04-25 NOTE — ED Notes (Signed)
Per EMS- Pt comes from home c/o weakness/numbness to right leg, unable to stand on leg. Reports episodes of this in past. Also, weakness in right hand x several days. Pt is alert and oriented x 4. BP 124/74, HR 90 SR. RR 20, 97% 2 L. 20 gauge to left arm.

## 2012-04-25 NOTE — ED Provider Notes (Signed)
History     CSN: [id number]  Arrival date & time 04/25/12  4098   First MD Initiated Contact with Patient 04/25/12 1900      Chief Complaint  Patient presents with  . Weakness    (Consider location/radiation/quality/duration/timing/severity/associated sxs/prior treatment) The history is provided by the patient.  [name] [name] is a 78 y.o. male hx of HL, COPD recently finished prednisone here with R foot pain. R foot pain since this AM. Wife noticed an area of cellulitis on the dorsum aspect of R foot that is tracking upwards. Denies fevers at home. Denies foot injury but difficulty bearing weight on the foot today. Cough is stable. No abdominal pain.     Past Medical History  Diagnosis Date  . Peripheral vascular disease   . Osteoarthritis   . Hypertension   . Hyperlipidemia   . CAD (coronary artery disease)     Non stemi 3/11; PTCA first OM 3/11  . COPD (chronic obstructive pulmonary disease)   . History of colonic polyps   . Dementia   . Back pain     Past Surgical History  Procedure Date  . Iliac artery stent     Stenting of left common iliac and external iliac  . Coronary angioplasty 08/2009    First OM    No family history on file.  History  Substance Use Topics  . Smoking status: Former Smoker -- 1.5 packs/day for 30 years    Types: Cigarettes    Quit date: 06/13/1985  . Smokeless tobacco: Never Used  . Alcohol Use: 3.6 oz/week    6 Cans of beer per week     Comment: Denies binge drinking      Review of Systems  Musculoskeletal:       R foot pain   Skin: Positive for rash.  All other systems reviewed and are negative.    Allergies  Iohexol; Penicillins; and Zolpidem tartrate  Home Medications   Current Outpatient Rx  Name  Route  Sig  Dispense  Refill  . ALBUTEROL SULFATE HFA 108 (90 BASE) MCG/ACT IN AERS   Inhalation   Inhale 2 puffs into the lungs every 4 (four) hours as needed for wheezing.   1 Inhaler   4   . AMLODIPINE  BESYLATE 5 MG PO TABS      take 1 tablet by mouth once daily   30 tablet   12     Refill Approved   . ASPIRIN 325 MG PO TBEC   Oral   Take 325 mg by mouth daily.           Marland Kitchen VITAMIN D 1000 UNITS PO TABS   Oral   Take 1,000 Units by mouth daily.           . DONEPEZIL HCL 10 MG PO TABS   Oral   Take 10 mg by mouth daily.         Marland Kitchen FAMOTIDINE 20 MG PO TABS   Oral   Take 20 mg by mouth as needed. For heart burn         . FLUTICASONE-SALMETEROL 250-50 MCG/DOSE IN AEPB   Inhalation   Inhale 1 puff into the lungs 2 (two) times daily.   60 each   11   . HYDROCODONE-ACETAMINOPHEN 5-325 MG PO TABS   Oral   Take 1 tablet by mouth every 6 (six) hours as needed. For pain         . IBUPROFEN 200 MG  PO TABS   Oral   Take 400 mg by mouth every 8 (eight) hours as needed. For pain.         Marland Kitchen PRAVASTATIN SODIUM 40 MG PO TABS      take 1 tablet by mouth at bedtime   30 tablet   12   . ROFLUMILAST 500 MCG PO TABS      Take one three times a week : Monday  Wednesday Saturday for one week then one every other day for a week then one daily   30 tablet   6   . TRAZODONE HCL 50 MG PO TABS   Oral   Take 50 mg by mouth at bedtime as needed. For sleep.         Marland Kitchen NITROGLYCERIN 0.4 MG SL SUBL   Sublingual   Place 0.4 mg under the tongue every 5 (five) minutes x 3 doses as needed. For chest pain.           BP 122/68  Pulse 72  Temp 98.2 F (36.8 C) (Oral)  Resp 16  SpO2 97%  Physical Exam  Nursing note and vitals reviewed. Constitutional: He appears well-developed and well-nourished.  HENT:  Head: Normocephalic.  Mouth/Throat: Oropharynx is clear and moist.  Eyes: Conjunctivae normal are normal. Pupils are equal, round, and reactive to light.  Neck: Normal range of motion. Neck supple.  Cardiovascular: Normal rate, regular rhythm and normal heart sounds.   Pulmonary/Chest: Effort normal and breath sounds normal. No respiratory distress. He has no wheezes.    Abdominal: Soft. Bowel sounds are normal. He exhibits no distension. There is no tenderness.  Musculoskeletal: Normal range of motion. He exhibits no edema.       Mild tenderness over right foot. Rash noted (see below). 2+ pulses, nl motor.   Skin: Skin is warm and dry.          Rash on dorsum of the foot tracting up to ankle area. No obvious skin breakdown.     ED Course  Procedures (including critical care time)  Labs Reviewed  CBC WITH DIFFERENTIAL - Abnormal; Notable for the following:    WBC 13.9 (*)     Neutro Abs 10.7 (*)     Monocytes Absolute 1.3 (*)     All other components within normal limits  COMPREHENSIVE METABOLIC PANEL - Abnormal; Notable for the following:    Sodium 133 (*)     Glucose, Bld 112 (*)     Total Bilirubin 1.9 (*)     GFR calc non Af Amer 59 (*)     GFR calc Af Amer 68 (*)     All other components within normal limits   Ct Head Wo Contrast  04/25/2012  *RADIOLOGY REPORT*  Clinical Data: Fall.  Weakness.  CT HEAD WITHOUT CONTRAST  Technique:  Contiguous axial images were obtained from the base of the skull through the vertex without contrast.  Comparison: MRI brain 04/28/2006.  The sinus CT 06/11/2009.  Findings: Moderate generalized atrophy is stable.  Ventricular size is proportionate to the degree of atrophy  No acute cortical infarct, hemorrhage, or mass lesion is evident.  The patient is status post bilateral sinus surgery with maxillary antrostomies and partial ethmoidectomies.  Mucosal thickening persists within the inferior left frontal sinus and some mucosal thickening in the residual ethmoid air cells.  The sphenoid sinuses are clear.  The mastoid air cells are clear.  The osseous skull is intact.  IMPRESSION:  1.  No acute intracranial abnormality or significant interval change. 2.  Stable atrophy. 3.  Postsurgical changes to the sinuses.   Original Report Authenticated By: Marin Roberts, M.D.    Dg Foot Complete Right  04/25/2012   *RADIOLOGY REPORT*  Clinical Data: Right foot pain  RIGHT FOOT COMPLETE - 3+ VIEW  Comparison: None  Findings: There is no evidence of fracture or dislocation.  There is no evidence of arthropathy or other focal bone abnormality. Soft tissues are unremarkable.  IMPRESSION: Negative exam.   Original Report Authenticated By: Signa Kell, M.D.      No diagnosis found.    MDM  [name] [name] is a 78 y.o. male here with R foot pain and cellulitis. Will get labs, xray foot to r/o osteomyelitis. Will give pain meds and reassess.   10:57 PM Labs showed WBC 14. CT head nl, xray foot showed no osteo. He was loaded with clindamycin and will d/c home on clinda.        Richardean Canal, MD 04/25/12 2300

## 2012-04-25 NOTE — ED Notes (Signed)
Pt discharged.Vital signs stable and GCS 15 

## 2012-04-27 NOTE — Telephone Encounter (Signed)
APPROVAL received for Daliresp. Approval will end on 06/12/2013. Pharmacy notified.

## 2012-05-31 ENCOUNTER — Encounter: Payer: Self-pay | Admitting: Critical Care Medicine

## 2012-05-31 ENCOUNTER — Ambulatory Visit: Payer: Medicare Other | Admitting: Critical Care Medicine

## 2012-05-31 ENCOUNTER — Ambulatory Visit (INDEPENDENT_AMBULATORY_CARE_PROVIDER_SITE_OTHER): Payer: Medicare Other | Admitting: Critical Care Medicine

## 2012-05-31 VITALS — BP 130/86 | HR 75 | Temp 97.5°F | Ht 67.0 in | Wt 181.0 lb

## 2012-05-31 DIAGNOSIS — J449 Chronic obstructive pulmonary disease, unspecified: Secondary | ICD-10-CM

## 2012-05-31 NOTE — Patient Instructions (Addendum)
Stop Daliresp No other medication changes Return 6 weeks for recheck

## 2012-05-31 NOTE — Progress Notes (Signed)
Subjective:    Patient ID: [name] [name], male    DOB: 07/16/1931, 78 y.o.   MRN: [id number]  HPI  78 yo male with known hx of COPD   03/13/2012 Acute OV  Complains of no appetite, increased sleepiness, prod cough with white mucus, increased SOB, wheezing x3days  No vomitting or fever.  No otc used.  Wife is with him today  No chest pain, edema or hemotpysis   Takes Advair regularly without missed doses.   04/23/2012 Pt seen by TP 10/1 for exacerbation Rx doxy and pred pulse Pt still on advair.  Now just white mucus.  No real chest pain.  No real wheeze. No emesis. No hemoptysis.   Not as sleepy Pt denies any significant sore throat, nasal congestion or excess secretions, fever, chills, sweats, unintended weight loss, pleurtic or exertional chest pain, orthopnea PND, or leg swelling Pt denies any increase in rescue therapy over baseline, denies waking up needing it or having any early am or nocturnal exacerbations of coughing/wheezing/or dyspnea. Pt also denies any obvious fluctuation in symptoms with  weather or environmental change or other alleviating or aggravating factors  05/31/2012 Pt not doing well. Pt is not dyspneic.  Pt notes h/a, aches all over, lost 16# of weight. Has not stopped Daliresp.    Pt not very active. Not eating.  Nauseated.   Notes some cough, min in amounts.    Past Medical History  Diagnosis Date  . Peripheral vascular disease   . Osteoarthritis   . Hypertension   . Hyperlipidemia   . CAD (coronary artery disease)     Non stemi 3/11; PTCA first OM 3/11  . COPD (chronic obstructive pulmonary disease)   . History of colonic polyps   . Dementia   . Back pain      No family history on file.   History   Social History  . Marital Status: Married    Spouse Name: N/A    Number of Children: N/A  . Years of Education: N/A   Occupational History  . Part time, not physical work    Social History Main Topics  . Smoking status: Former Smoker --  1.5 packs/day for 30 years    Types: Cigarettes    Quit date: 06/13/1985  . Smokeless tobacco: Never Used  . Alcohol Use: 3.6 oz/week    6 Cans of beer per week     Comment: Denies binge drinking  . Drug Use: No  . Sexually Active: Not on file   Other Topics Concern  . Not on file   Social History Narrative   [place] wifeNo herbal medicationsRegular dietNo regular exercise     Allergies  Allergen Reactions  . Iohexol      Desc: Increased difficulty breathing, Onset Date: [id number]   . Penicillins   . Zolpidem Tartrate     REACTION: Acute delirium     Outpatient Prescriptions Prior to Visit  Medication Sig Dispense Refill  . albuterol (PROAIR HFA) 108 (90 BASE) MCG/ACT inhaler Inhale 2 puffs into the lungs every 4 (four) hours as needed for wheezing.  1 Inhaler  4  . amLODipine (NORVASC) 5 MG tablet take 1 tablet by mouth once daily  30 tablet  12  . aspirin 325 MG EC tablet Take 325 mg by mouth daily.        . cholecalciferol (VITAMIN D) 1000 UNITS tablet Take 1,000 Units by mouth daily.        Marland Kitchen  donepezil (ARICEPT) 10 MG tablet Take 10 mg by mouth daily.      . famotidine (PEPCID) 20 MG tablet Take 20 mg by mouth as needed. For heart burn      . Fluticasone-Salmeterol (ADVAIR DISKUS) 250-50 MCG/DOSE AEPB Inhale 1 puff into the lungs 2 (two) times daily.  60 each  11  . HYDROcodone-acetaminophen (NORCO) 5-325 MG per tablet Take 2 tablets by mouth every 4 (four) hours as needed for pain.  10 tablet  0  . ibuprofen (ADVIL,MOTRIN) 200 MG tablet Take 400 mg by mouth every 8 (eight) hours as needed. For pain.      . nitroGLYCERIN (NITROSTAT) 0.4 MG SL tablet Place 0.4 mg under the tongue every 5 (five) minutes x 3 doses as needed. For chest pain.      . pravastatin (PRAVACHOL) 40 MG tablet take 1 tablet by mouth at bedtime  30 tablet  12  . traZODone (DESYREL) 50 MG tablet Take 50 mg by mouth at bedtime as needed. For sleep.      . [DISCONTINUED]  HYDROcodone-acetaminophen (NORCO/VICODIN) 5-325 MG per tablet Take 1 tablet by mouth every 6 (six) hours as needed. For pain      . [DISCONTINUED] roflumilast (DALIRESP) 500 MCG TABS tablet Take one three times a week : Monday  Wednesday Saturday for one week then one every other day for a week then one daily  30 tablet  6  . [DISCONTINUED] clindamycin (CLEOCIN) 300 MG capsule Take 1 capsule (300 mg total) by mouth 4 (four) times daily. X 7 days  28 capsule  0   Last reviewed on 05/31/2012  1:56 PM by Storm Frisk, MD     Review of Systems  Constitutional:   No  weight loss, night sweats,  Fevers, chills,  +fatigue, or  lassitude.  HEENT:   No headaches,  Difficulty swallowing,  Tooth/dental problems, or  Sore throat,                No sneezing, itching, ear ache, nasal congestion, post nasal drip,   CV:  No chest pain,  Orthopnea, PND, swelling in lower extremities, anasarca, dizziness, palpitations, syncope.   GI  No heartburn, indigestion, abdominal pain, nausea, vomiting, diarrhea, change in bowel habits, loss of appetite, bloody stools.    RESP  No chest wall deformity  Skin: no rash or lesions.  GU: no dysuria, change in color of urine, no urgency or frequency.  No flank pain, no hematuria   MS:  No joint pain or swelling.  No decreased range of motion.  No back pain.  Psych:  No change in mood or affect. No depression or anxiety.  No memory loss.         Objective:   Physical Exam BP 130/86  Pulse 75  Temp 97.5 F (36.4 C) (Oral)  Ht 5\' 7"  (1.702 m)  Wt 181 lb (82.101 kg)  BMI 28.35 kg/m2  SpO2 93%  GEN: A/Ox3; pleasant , NAD, well nourished   HEENT:  [place]/AT,  EACs-clear, TMs-wnl, NOSE-clear, THROAT-clear, no lesions, no postnasal drip or exudate noted.   NECK:  Supple w/ fair ROM; no JVD; normal carotid impulses w/o bruits; no thyromegaly or nodules palpated; no lymphadenopathy.  RESP  Coarse BS w/ no wheezes. no accessory muscle use, no dullness to  percussion  CARD:  RRR, no m/r/g  , no peripheral edema, pulses intact, no cyanosis or clubbing.  GI:   Soft & nt; nml bowel sounds; no  organomegaly or masses detected.  Musco: Warm bil, no deformities or joint swelling noted.   Neuro: alert, no focal deficits noted.    Skin: Warm, no lesions or rashes  '       Assessment & Plan:   COPD Gold B Frequent exacerbations COPD gold stage B. but frequent exacerbations now present Probable adverse side effects to Daliresp therefore this medication stopped on 05/31/2012 Plan Discontinue Daliresp Continued inhaled medications as prescribed Return 6 weeks     Updated Medication List Outpatient Encounter Prescriptions as of 05/31/2012  Medication Sig Dispense Refill  . albuterol (PROAIR HFA) 108 (90 BASE) MCG/ACT inhaler Inhale 2 puffs into the lungs every 4 (four) hours as needed for wheezing.  1 Inhaler  4  . amLODipine (NORVASC) 5 MG tablet take 1 tablet by mouth once daily  30 tablet  12  . aspirin 325 MG EC tablet Take 325 mg by mouth daily.        . cholecalciferol (VITAMIN D) 1000 UNITS tablet Take 1,000 Units by mouth daily.        Marland Kitchen donepezil (ARICEPT) 10 MG tablet Take 10 mg by mouth daily.      . famotidine (PEPCID) 20 MG tablet Take 20 mg by mouth as needed. For heart burn      . Fluticasone-Salmeterol (ADVAIR DISKUS) 250-50 MCG/DOSE AEPB Inhale 1 puff into the lungs 2 (two) times daily.  60 each  11  . HYDROcodone-acetaminophen (NORCO) 5-325 MG per tablet Take 2 tablets by mouth every 4 (four) hours as needed for pain.  10 tablet  0  . ibuprofen (ADVIL,MOTRIN) 200 MG tablet Take 400 mg by mouth every 8 (eight) hours as needed. For pain.      . nitroGLYCERIN (NITROSTAT) 0.4 MG SL tablet Place 0.4 mg under the tongue every 5 (five) minutes x 3 doses as needed. For chest pain.      . pravastatin (PRAVACHOL) 40 MG tablet take 1 tablet by mouth at bedtime  30 tablet  12  . roflumilast (DALIRESP) 500 MCG TABS tablet Take 500  mcg by mouth daily.      . traZODone (DESYREL) 50 MG tablet Take 50 mg by mouth at bedtime as needed. For sleep.      . [DISCONTINUED] HYDROcodone-acetaminophen (NORCO/VICODIN) 5-325 MG per tablet Take 1 tablet by mouth every 6 (six) hours as needed. For pain      . [DISCONTINUED] roflumilast (DALIRESP) 500 MCG TABS tablet Take one three times a week : Monday  Wednesday Saturday for one week then one every other day for a week then one daily  30 tablet  6  . [DISCONTINUED] clindamycin (CLEOCIN) 300 MG capsule Take 1 capsule (300 mg total) by mouth 4 (four) times daily. X 7 days  28 capsule  0

## 2012-05-31 NOTE — Assessment & Plan Note (Signed)
COPD gold stage B. but frequent exacerbations now present Probable adverse side effects to Daliresp therefore this medication stopped on 05/31/2012 Plan Discontinue Daliresp Continued inhaled medications as prescribed Return 6 weeks

## 2012-07-12 ENCOUNTER — Encounter: Payer: Self-pay | Admitting: Critical Care Medicine

## 2012-07-12 ENCOUNTER — Ambulatory Visit (INDEPENDENT_AMBULATORY_CARE_PROVIDER_SITE_OTHER): Payer: Medicare Other | Admitting: Critical Care Medicine

## 2012-07-12 VITALS — BP 114/76 | HR 74 | Temp 97.6°F | Ht 67.0 in | Wt 183.0 lb

## 2012-07-12 DIAGNOSIS — M069 Rheumatoid arthritis, unspecified: Secondary | ICD-10-CM | POA: Insufficient documentation

## 2012-07-12 DIAGNOSIS — J449 Chronic obstructive pulmonary disease, unspecified: Secondary | ICD-10-CM

## 2012-07-12 NOTE — Patient Instructions (Signed)
No change in medications. Return in         4 months 

## 2012-07-12 NOTE — Progress Notes (Signed)
Subjective:    Patient ID: [name] [name], male    DOB: 01-18-1932, 79 y.o.   MRN: [id number]  HPI  79 y.o.  male with known hx of COPD Gold B freq exacerbations AB component.  05/31/2012 Pt not doing well. Pt is not dyspneic.  Pt notes h/a, aches all over, lost 16# of weight. Has not stopped Daliresp.    Pt not very active. Not eating.  Nauseated.   Notes some cough, min in amounts.    07/12/2012 Pt did not improve off the Daliresp.  Pt saw [name] and dx RA.  Rx for this :  Arava and prednisone.  This helped the pain.  Now is eating better.  Pt has some cough and is minimal.  Pt denies any significant sore throat, nasal congestion or excess secretions, fever, chills, sweats, unintended weight loss, pleurtic or exertional chest pain, orthopnea PND, or leg swelling Pt denies any increase in rescue therapy over baseline, denies waking up needing it or having any early am or nocturnal exacerbations of coughing/wheezing/or dyspnea. Pt also denies any obvious fluctuation in symptoms with  weather or environmental change or other alleviating or aggravating factors   Past Medical History  Diagnosis Date  . Peripheral vascular disease   . Osteoarthritis   . Hypertension   . Hyperlipidemia   . CAD (coronary artery disease)     Non stemi 3/11; PTCA first OM 3/11  . COPD (chronic obstructive pulmonary disease)   . History of colonic polyps   . Dementia   . Back pain   . Rheumatoid arthritis 2013    [name]      No family history on file.   History   Social History  . Marital Status: Married    Spouse Name: N/A    Number of Children: N/A  . Years of Education: N/A   Occupational History  . Part time, not physical work    Social History Main Topics  . Smoking status: Former Smoker -- 1.5 packs/day for 30 years    Types: Cigarettes    Quit date: 06/13/1985  . Smokeless tobacco: Never Used  . Alcohol Use: 3.6 oz/week    6 Cans of beer per week     Comment:  Denies binge drinking  . Drug Use: No  . Sexually Active: Not on file   Other Topics Concern  . Not on file   Social History Narrative   [place] with wifeNo herbal medicationsRegular dietNo regular exercise     Allergies  Allergen Reactions  . Iohexol      Desc: Increased difficulty breathing, Onset Date: [id number]   . Penicillins   . Zolpidem Tartrate     REACTION: Acute delirium     Outpatient Prescriptions Prior to Visit  Medication Sig Dispense Refill  . albuterol (PROAIR HFA) 108 (90 BASE) MCG/ACT inhaler Inhale 2 puffs into the lungs every 4 (four) hours as needed for wheezing.  1 Inhaler  4  . amLODipine (NORVASC) 5 MG tablet take 1 tablet by mouth once daily  30 tablet  12  . aspirin 325 MG EC tablet Take 325 mg by mouth daily.        . cholecalciferol (VITAMIN D) 1000 UNITS tablet Take 1,000 Units by mouth daily.        Marland Kitchen donepezil (ARICEPT) 10 MG tablet Take 10 mg by mouth daily.      . famotidine (PEPCID) 20 MG tablet Take 20 mg by mouth as  needed. For heart burn      . Fluticasone-Salmeterol (ADVAIR DISKUS) 250-50 MCG/DOSE AEPB Inhale 1 puff into the lungs 2 (two) times daily.  60 each  11  . ibuprofen (ADVIL,MOTRIN) 200 MG tablet Take 400 mg by mouth every 8 (eight) hours as needed. For pain.      . nitroGLYCERIN (NITROSTAT) 0.4 MG SL tablet Place 0.4 mg under the tongue every 5 (five) minutes x 3 doses as needed. For chest pain.      . pravastatin (PRAVACHOL) 40 MG tablet take 1 tablet by mouth at bedtime  30 tablet  12  . roflumilast (DALIRESP) 500 MCG TABS tablet Take 500 mcg by mouth every other day.       . traZODone (DESYREL) 50 MG tablet Take 50 mg by mouth at bedtime as needed. For sleep.      . [DISCONTINUED] HYDROcodone-acetaminophen (NORCO) 5-325 MG per tablet Take 2 tablets by mouth every 4 (four) hours as needed for pain.  10 tablet  0  Last reviewed on 07/12/2012 10:12 AM by Storm Frisk, MD     Review of Systems  Constitutional:   No   weight loss, night sweats,  Fevers, chills,  +fatigue, or  lassitude.  HEENT:   No headaches,  Difficulty swallowing,  Tooth/dental problems, or  Sore throat,                No sneezing, itching, ear ache, nasal congestion, post nasal drip,   CV:  No chest pain,  Orthopnea, PND, swelling in lower extremities, anasarca, dizziness, palpitations, syncope.   GI  No heartburn, indigestion, abdominal pain, nausea, vomiting, diarrhea, change in bowel habits, loss of appetite, bloody stools.    RESP  No chest wall deformity  Skin: no rash or lesions.  GU: no dysuria, change in color of urine, no urgency or frequency.  No flank pain, no hematuria   MS:  No joint pain or swelling.  No decreased range of motion.  No back pain.  Psych:  No change in mood or affect. No depression or anxiety.  No memory loss.         Objective:   Physical Exam BP 114/76  Pulse 74  Temp 97.6 F (36.4 C) (Oral)  Ht 5\' 7"  (1.702 m)  Wt 183 lb (83.008 kg)  BMI 28.66 kg/m2  SpO2 94%  GEN: A/Ox3; pleasant , NAD, well nourished   HEENT:  [place]/AT,  EACs-clear, TMs-wnl, NOSE-clear, THROAT-clear, no lesions, no postnasal drip or exudate noted.   NECK:  Supple w/ fair ROM; no JVD; normal carotid impulses w/o bruits; no thyromegaly or nodules palpated; no lymphadenopathy.  RESP  Coarse BS w/ no wheezes. no accessory muscle use, no dullness to percussion  CARD:  RRR, no m/r/g  , no peripheral edema, pulses intact, no cyanosis or clubbing.  GI:   Soft & nt; nml bowel sounds; no organomegaly or masses detected.  Musco: Warm bil, no deformities or joint swelling noted.   Neuro: alert, no focal deficits noted.    Skin: Warm, no lesions or rashes         Assessment & Plan:   COPD Gold B Frequent exacerbations Gold stage B. COPD with frequent exacerbations now improved on Daliresp. There was a question whether patient had side effects from the Daliresp however this ultimately turned out to be due to  rheumatoid arthritis flare. The patient is now back on Daliresp and tolerating well in every other day dosing.  Plan  Continue Advair twice daily Continue Daliresp every other day Return 4 months    Updated Medication List Outpatient Encounter Prescriptions as of 07/12/2012  Medication Sig Dispense Refill  . albuterol (PROAIR HFA) 108 (90 BASE) MCG/ACT inhaler Inhale 2 puffs into the lungs every 4 (four) hours as needed for wheezing.  1 Inhaler  4  . amLODipine (NORVASC) 5 MG tablet take 1 tablet by mouth once daily  30 tablet  12  . aspirin 325 MG EC tablet Take 325 mg by mouth daily.        . cholecalciferol (VITAMIN D) 1000 UNITS tablet Take 1,000 Units by mouth daily.        Marland Kitchen donepezil (ARICEPT) 10 MG tablet Take 10 mg by mouth daily.      . famotidine (PEPCID) 20 MG tablet Take 20 mg by mouth as needed. For heart burn      . Fluticasone-Salmeterol (ADVAIR DISKUS) 250-50 MCG/DOSE AEPB Inhale 1 puff into the lungs 2 (two) times daily.  60 each  11  . ibuprofen (ADVIL,MOTRIN) 200 MG tablet Take 400 mg by mouth every 8 (eight) hours as needed. For pain.      Marland Kitchen leflunomide (ARAVA) 10 MG tablet Take 10 mg by mouth daily.      . nitroGLYCERIN (NITROSTAT) 0.4 MG SL tablet Place 0.4 mg under the tongue every 5 (five) minutes x 3 doses as needed. For chest pain.      . pravastatin (PRAVACHOL) 40 MG tablet take 1 tablet by mouth at bedtime  30 tablet  12  . predniSONE (DELTASONE) 5 MG tablet Take 10 mg by mouth daily.      . roflumilast (DALIRESP) 500 MCG TABS tablet Take 500 mcg by mouth every other day.       . traZODone (DESYREL) 50 MG tablet Take 50 mg by mouth at bedtime as needed. For sleep.      . [DISCONTINUED] HYDROcodone-acetaminophen (NORCO) 5-325 MG per tablet Take 2 tablets by mouth every 4 (four) hours as needed for pain.  10 tablet  0

## 2012-07-12 NOTE — Assessment & Plan Note (Signed)
Gold stage B. COPD with frequent exacerbations now improved on Daliresp. There was a question whether patient had side effects from the Daliresp however this ultimately turned out to be due to rheumatoid arthritis flare. The patient is now back on Daliresp and tolerating well in every other day dosing.  Plan Continue Advair twice daily Continue Daliresp every other day Return 4 months

## 2012-10-06 ENCOUNTER — Other Ambulatory Visit: Payer: Self-pay | Admitting: Cardiology

## 2012-11-15 ENCOUNTER — Ambulatory Visit (INDEPENDENT_AMBULATORY_CARE_PROVIDER_SITE_OTHER): Payer: Medicare Other | Admitting: Critical Care Medicine

## 2012-11-15 ENCOUNTER — Encounter: Payer: Self-pay | Admitting: Critical Care Medicine

## 2012-11-15 VITALS — BP 112/74 | HR 60 | Temp 97.5°F | Ht 67.0 in | Wt 186.0 lb

## 2012-11-15 DIAGNOSIS — J4489 Other specified chronic obstructive pulmonary disease: Secondary | ICD-10-CM

## 2012-11-15 DIAGNOSIS — J449 Chronic obstructive pulmonary disease, unspecified: Secondary | ICD-10-CM

## 2012-11-15 NOTE — Patient Instructions (Addendum)
No change in medications. Return in         4 months 

## 2012-11-15 NOTE — Assessment & Plan Note (Signed)
Gold stage B. COPD but frequent exacerbations now improved on Daliresp Plan Maintain Daliresp one every other day dosing as the patient tolerates this better Maintain Advair 250 one puff twice daily Return 6 months

## 2012-11-15 NOTE — Progress Notes (Signed)
Subjective:    Patient ID: [name] [name], male    DOB: 12/03/1931, 79 y.o.   MRN: [id number]  HPI  79 y.o.  male with known hx of COPD Gold B freq exacerbations AB component.    11/15/2012 Chief Complaint  Patient presents with  . 4 month follow up    Breathing is unchanged.  Has SOB only with overexertion, coughing some, and very little wheezing.  Cough is prod at times with clear mucus.  No chest tightness or chest pain at this time.  Notes min cough.  occ wheeze with exertion.  Dyspnea unchange.   No real chest pain No qhs dyspnea. Tolerating Daliresp well   Past Medical History  Diagnosis Date  . Peripheral vascular disease   . Osteoarthritis   . Hypertension   . Hyperlipidemia   . CAD (coronary artery disease)     Non stemi 3/11; PTCA first OM 3/11  . COPD (chronic obstructive pulmonary disease)   . History of colonic polyps   . Dementia   . Back pain   . Rheumatoid arthritis(714.0) 2013    [name]      No family history on file.   History   Social History  . Marital Status: Married    Spouse Name: N/A    Number of Children: N/A  . Years of Education: N/A   Occupational History  . Part time, not physical work    Social History Main Topics  . Smoking status: Former Smoker -- 1.50 packs/day for 30 years    Types: Cigarettes    Quit date: 06/13/1985  . Smokeless tobacco: Never Used  . Alcohol Use: 3.6 oz/week    6 Cans of beer per week     Comment: Denies binge drinking  . Drug Use: No  . Sexually Active: Not on file   Other Topics Concern  . Not on file   Social History Narrative   Lives in [place] with wife   No herbal medications   Regular diet   No regular exercise     Allergies  Allergen Reactions  . Iohexol      Desc: Increased difficulty breathing, Onset Date: [id number]   . Penicillins   . Zolpidem Tartrate     REACTION: Acute delirium     Outpatient Prescriptions Prior to Visit  Medication Sig Dispense Refill  .  albuterol (PROAIR HFA) 108 (90 BASE) MCG/ACT inhaler Inhale 2 puffs into the lungs every 4 (four) hours as needed for wheezing.  1 Inhaler  4  . amLODipine (NORVASC) 5 MG tablet take 1 tablet by mouth once daily  30 tablet  12  . aspirin 325 MG EC tablet Take 325 mg by mouth daily.        . cholecalciferol (VITAMIN D) 1000 UNITS tablet Take 1,000 Units by mouth daily.        Marland Kitchen donepezil (ARICEPT) 10 MG tablet Take 10 mg by mouth daily.      . famotidine (PEPCID) 20 MG tablet Take 20 mg by mouth as needed. For heart burn      . Fluticasone-Salmeterol (ADVAIR DISKUS) 250-50 MCG/DOSE AEPB Inhale 1 puff into the lungs 2 (two) times daily.  60 each  11  . ibuprofen (ADVIL,MOTRIN) 200 MG tablet Take 400 mg by mouth every 8 (eight) hours as needed. For pain.      Marland Kitchen leflunomide (ARAVA) 10 MG tablet Take 20 mg by mouth daily.       . nitroGLYCERIN (NITROSTAT)  0.4 MG SL tablet Place 0.4 mg under the tongue every 5 (five) minutes x 3 doses as needed. For chest pain.      . pravastatin (PRAVACHOL) 40 MG tablet take 1 tablet by mouth at bedtime  30 tablet  12  . predniSONE (DELTASONE) 5 MG tablet Take 5 mg by mouth daily.       . roflumilast (DALIRESP) 500 MCG TABS tablet Take 500 mcg by mouth every other day.       . traZODone (DESYREL) 50 MG tablet Take 50 mg by mouth at bedtime as needed. For sleep.       No facility-administered medications prior to visit.       Review of Systems  Constitutional:   No  weight loss, night sweats,  Fevers, chills,  +fatigue, or  lassitude.  HEENT:   No headaches,  Difficulty swallowing,  Tooth/dental problems, or  Sore throat,                No sneezing, itching, ear ache, nasal congestion, post nasal drip,   CV:  No chest pain,  Orthopnea, PND, swelling in lower extremities, anasarca, dizziness, palpitations, syncope.   GI  No heartburn, indigestion, abdominal pain, nausea, vomiting, diarrhea, change in bowel habits, loss of appetite, bloody stools.    RESP   No chest wall deformity  Skin: no rash or lesions.  GU: no dysuria, change in color of urine, no urgency or frequency.  No flank pain, no hematuria   MS:  No joint pain or swelling.  No decreased range of motion.  No back pain.  Psych:  No change in mood or affect. No depression or anxiety.  No memory loss.         Objective:   Physical Exam BP 112/74  Pulse 60  Temp(Src) 97.5 F (36.4 C) (Oral)  Ht 5\' 7"  (1.702 m)  Wt 186 lb (84.369 kg)  BMI 29.12 kg/m2  SpO2 95%  GEN: A/Ox3; pleasant , NAD, well nourished   HEENT:  [place]/AT,  EACs-clear, TMs-wnl, NOSE-clear, THROAT-clear, no lesions, no postnasal drip or exudate noted.   NECK:  Supple w/ fair ROM; no JVD; normal carotid impulses w/o bruits; no thyromegaly or nodules palpated; no lymphadenopathy.  RESP  Coarse BS w/ no wheezes. no accessory muscle use, no dullness to percussion  CARD:  RRR, no m/r/g  , no peripheral edema, pulses intact, no cyanosis or clubbing.  GI:   Soft & nt; nml bowel sounds; no organomegaly or masses detected.  Musco: Warm bil, no deformities or joint swelling noted.   Neuro: alert, no focal deficits noted.    Skin: Warm, no lesions or rashes         Assessment & Plan:   COPD Gold B Frequent exacerbations Gold stage B. COPD but frequent exacerbations now improved on Daliresp Plan Maintain Daliresp one every other day dosing as the patient tolerates this better Maintain Advair 250 one puff twice daily Return 6 months    Updated Medication List Outpatient Encounter Prescriptions as of 11/15/2012  Medication Sig Dispense Refill  . albuterol (PROAIR HFA) 108 (90 BASE) MCG/ACT inhaler Inhale 2 puffs into the lungs every 4 (four) hours as needed for wheezing.  1 Inhaler  4  . amLODipine (NORVASC) 5 MG tablet take 1 tablet by mouth once daily  30 tablet  12  . aspirin 325 MG EC tablet Take 325 mg by mouth daily.        . cholecalciferol (VITAMIN  D) 1000 UNITS tablet Take 1,000 Units by  mouth daily.        Marland Kitchen donepezil (ARICEPT) 10 MG tablet Take 10 mg by mouth daily.      . famotidine (PEPCID) 20 MG tablet Take 20 mg by mouth as needed. For heart burn      . Fluticasone-Salmeterol (ADVAIR DISKUS) 250-50 MCG/DOSE AEPB Inhale 1 puff into the lungs 2 (two) times daily.  60 each  11  . ibuprofen (ADVIL,MOTRIN) 200 MG tablet Take 400 mg by mouth every 8 (eight) hours as needed. For pain.      Marland Kitchen leflunomide (ARAVA) 10 MG tablet Take 20 mg by mouth daily.       . nitroGLYCERIN (NITROSTAT) 0.4 MG SL tablet Place 0.4 mg under the tongue every 5 (five) minutes x 3 doses as needed. For chest pain.      . pravastatin (PRAVACHOL) 40 MG tablet take 1 tablet by mouth at bedtime  30 tablet  12  . predniSONE (DELTASONE) 5 MG tablet Take 5 mg by mouth daily.       . roflumilast (DALIRESP) 500 MCG TABS tablet Take 500 mcg by mouth every other day.       . traZODone (DESYREL) 50 MG tablet Take 50 mg by mouth at bedtime as needed. For sleep.       No facility-administered encounter medications on file as of 11/15/2012.

## 2013-01-18 ENCOUNTER — Other Ambulatory Visit: Payer: Self-pay | Admitting: Cardiology

## 2013-01-18 NOTE — Telephone Encounter (Signed)
This will need to go to the pt pcp. thanks

## 2013-01-22 ENCOUNTER — Other Ambulatory Visit: Payer: Self-pay | Admitting: Cardiology

## 2013-01-29 ENCOUNTER — Other Ambulatory Visit: Payer: Self-pay | Admitting: *Deleted

## 2013-01-29 MED ORDER — FLUTICASONE-SALMETEROL 250-50 MCG/DOSE IN AEPB: 1.0000 | INHALATION_SPRAY | Freq: Two times a day (BID) | RESPIRATORY_TRACT | Status: DC

## 2013-02-25 ENCOUNTER — Other Ambulatory Visit: Payer: Self-pay | Admitting: *Deleted

## 2013-02-25 MED ORDER — ALBUTEROL SULFATE HFA 108 (90 BASE) MCG/ACT IN AERS: 2.0000 | INHALATION_SPRAY | RESPIRATORY_TRACT | Status: DC | PRN

## 2013-03-21 ENCOUNTER — Other Ambulatory Visit: Payer: Self-pay | Admitting: Cardiology

## 2013-03-25 ENCOUNTER — Encounter: Payer: Self-pay | Admitting: Critical Care Medicine

## 2013-03-25 ENCOUNTER — Ambulatory Visit (INDEPENDENT_AMBULATORY_CARE_PROVIDER_SITE_OTHER): Payer: Medicare Other | Admitting: Critical Care Medicine

## 2013-03-25 VITALS — BP 116/84 | HR 60 | Temp 97.6°F | Ht 67.0 in | Wt 178.0 lb

## 2013-03-25 DIAGNOSIS — J449 Chronic obstructive pulmonary disease, unspecified: Secondary | ICD-10-CM

## 2013-03-25 DIAGNOSIS — Z23 Encounter for immunization: Secondary | ICD-10-CM

## 2013-03-25 DIAGNOSIS — J4489 Other specified chronic obstructive pulmonary disease: Secondary | ICD-10-CM

## 2013-03-25 MED ORDER — FLUTICASONE-SALMETEROL 250-50 MCG/DOSE IN AEPB: 1.0000 | INHALATION_SPRAY | Freq: Two times a day (BID) | RESPIRATORY_TRACT | Status: DC

## 2013-03-25 NOTE — Progress Notes (Signed)
Subjective:    Patient ID: [name] [name], male    DOB: Jun 08, 1932, 79 y.o.   MRN: [id number]  HPI  79 y.o.  male with known hx of COPD Gold B freq exacerbations AB component. 03/25/2013 Chief Complaint  Patient presents with  . COPD    Breathing is unchanged. Reports DOE. Denies chest tightness, coughing or wheezing.  Not real change in dyspnea.  Not now on pred at all. No recent flareups.   Past Medical History  Diagnosis Date  . Peripheral vascular disease   . Osteoarthritis   . Hypertension   . Hyperlipidemia   . CAD (coronary artery disease)     Non stemi 3/11; PTCA first OM 3/11  . COPD (chronic obstructive pulmonary disease)   . History of colonic polyps   . Dementia   . Back pain   . Rheumatoid arthritis(714.0) 2013    [name]      History reviewed. No pertinent family history.   History   Social History  . Marital Status: Married    Spouse Name: N/A    Number of Children: N/A  . Years of Education: N/A   Occupational History  . Part time, not physical work    Social History Main Topics  . Smoking status: Former Smoker -- 1.50 packs/day for 30 years    Types: Cigarettes    Quit date: 06/13/1985  . Smokeless tobacco: Never Used  . Alcohol Use: No     Comment: Denies binge drinking  . Drug Use: No  . Sexual Activity: Not on file   Other Topics Concern  . Not on file   Social History Narrative   Lives in [place] with wife   No herbal medications   Regular diet   No regular exercise     Allergies  Allergen Reactions  . Iohexol      Desc: Increased difficulty breathing, Onset Date: [id number]   . Penicillins   . Zolpidem Tartrate     REACTION: Acute delirium     Outpatient Prescriptions Prior to Visit  Medication Sig Dispense Refill  . albuterol (PROAIR HFA) 108 (90 BASE) MCG/ACT inhaler Inhale 2 puffs into the lungs every 4 (four) hours as needed for wheezing.  1 Inhaler  5  . amLODipine (NORVASC) 5 MG tablet take 1 tablet by  mouth once daily  30 tablet  1  . aspirin 325 MG EC tablet Take 325 mg by mouth daily.        . cholecalciferol (VITAMIN D) 1000 UNITS tablet Take 1,000 Units by mouth daily.        Marland Kitchen donepezil (ARICEPT) 10 MG tablet Take 10 mg by mouth daily.      . famotidine (PEPCID) 20 MG tablet Take 20 mg by mouth as needed. For heart burn      . ibuprofen (ADVIL,MOTRIN) 200 MG tablet Take 400 mg by mouth every 8 (eight) hours as needed. For pain.      Marland Kitchen leflunomide (ARAVA) 10 MG tablet Take 20 mg by mouth daily.       . nitroGLYCERIN (NITROSTAT) 0.4 MG SL tablet Place 0.4 mg under the tongue every 5 (five) minutes x 3 doses as needed. For chest pain.      . pravastatin (PRAVACHOL) 40 MG tablet take 1 tablet by mouth at bedtime  30 tablet  12  . roflumilast (DALIRESP) 500 MCG TABS tablet Take 500 mcg by mouth every other day.       . traZODone (  DESYREL) 50 MG tablet Take 50 mg by mouth at bedtime as needed. For sleep.      Marland Kitchen Fluticasone-Salmeterol (ADVAIR DISKUS) 250-50 MCG/DOSE AEPB Inhale 1 puff into the lungs 2 (two) times daily.  60 each  6  . predniSONE (DELTASONE) 5 MG tablet Take 5 mg by mouth daily.        No facility-administered medications prior to visit.       Review of Systems  Constitutional:   No  weight loss, night sweats,  Fevers, chills,  +fatigue, or  lassitude.  HEENT:   No headaches,  Difficulty swallowing,  Tooth/dental problems, or  Sore throat,                No sneezing, itching, ear ache, nasal congestion, post nasal drip,   CV:  No chest pain,  Orthopnea, PND, swelling in lower extremities, anasarca, dizziness, palpitations, syncope.   GI  No heartburn, indigestion, abdominal pain, nausea, vomiting, diarrhea, change in bowel habits, loss of appetite, bloody stools.    RESP  No chest wall deformity  Skin: no rash or lesions.  GU: no dysuria, change in color of urine, no urgency or frequency.  No flank pain, no hematuria   MS:  No joint pain or swelling.  No  decreased range of motion.  No back pain.  Psych:  No change in mood or affect. No depression or anxiety.  No memory loss.         Objective:   Physical Exam BP 116/84  Pulse 60  Temp(Src) 97.6 F (36.4 C) (Oral)  Ht 5\' 7"  (1.702 m)  Wt 178 lb (80.74 kg)  BMI 27.87 kg/m2  SpO2 94%  GEN: A/Ox3; pleasant , NAD, well nourished   HEENT:  [place]/AT,  EACs-clear, TMs-wnl, NOSE-clear, THROAT-clear, no lesions, no postnasal drip or exudate noted.   NECK:  Supple w/ fair ROM; no JVD; normal carotid impulses w/o bruits; no thyromegaly or nodules palpated; no lymphadenopathy.  RESP  Coarse BS w/ no wheezes. no accessory muscle use, no dullness to percussion  CARD:  RRR, no m/r/g  , no peripheral edema, pulses intact, no cyanosis or clubbing.  GI:   Soft & nt; nml bowel sounds; no organomegaly or masses detected.  Musco: Warm bil, no deformities or joint swelling noted.   Neuro: alert, no focal deficits noted.    Skin: Warm, no lesions or rashes         Assessment & Plan:   Obstructive chronic bronchitis without exacerbation Gold B Gold B copd with frequent exac improved Plan Flu vaccine No the changes in meds     Updated Medication List Outpatient Encounter Prescriptions as of 03/25/2013  Medication Sig Dispense Refill  . albuterol (PROAIR HFA) 108 (90 BASE) MCG/ACT inhaler Inhale 2 puffs into the lungs every 4 (four) hours as needed for wheezing.  1 Inhaler  5  . amLODipine (NORVASC) 5 MG tablet take 1 tablet by mouth once daily  30 tablet  1  . aspirin 325 MG EC tablet Take 325 mg by mouth daily.        . cholecalciferol (VITAMIN D) 1000 UNITS tablet Take 1,000 Units by mouth daily.        Marland Kitchen donepezil (ARICEPT) 10 MG tablet Take 10 mg by mouth daily.      . famotidine (PEPCID) 20 MG tablet Take 20 mg by mouth as needed. For heart burn      . Fluticasone-Salmeterol (ADVAIR DISKUS) 250-50 MCG/DOSE AEPB Inhale  1 puff into the lungs 2 (two) times daily.  60 each  11  .  ibuprofen (ADVIL,MOTRIN) 200 MG tablet Take 400 mg by mouth every 8 (eight) hours as needed. For pain.      Marland Kitchen leflunomide (ARAVA) 10 MG tablet Take 20 mg by mouth daily.       . nitroGLYCERIN (NITROSTAT) 0.4 MG SL tablet Place 0.4 mg under the tongue every 5 (five) minutes x 3 doses as needed. For chest pain.      . pravastatin (PRAVACHOL) 40 MG tablet take 1 tablet by mouth at bedtime  30 tablet  12  . roflumilast (DALIRESP) 500 MCG TABS tablet Take 500 mcg by mouth every other day.       . traZODone (DESYREL) 50 MG tablet Take 50 mg by mouth at bedtime as needed. For sleep.      . [DISCONTINUED] Fluticasone-Salmeterol (ADVAIR DISKUS) 250-50 MCG/DOSE AEPB Inhale 1 puff into the lungs 2 (two) times daily.  60 each  6  . [DISCONTINUED] predniSONE (DELTASONE) 5 MG tablet Take 5 mg by mouth daily.        No facility-administered encounter medications on file as of 03/25/2013.

## 2013-03-25 NOTE — Patient Instructions (Signed)
A Flu vaccine was given No change in medications Return 4 months

## 2013-03-26 NOTE — Assessment & Plan Note (Signed)
Gold B copd with frequent exac improved Plan Flu vaccine No the changes in meds

## 2013-04-10 ENCOUNTER — Encounter: Payer: Self-pay | Admitting: Cardiology

## 2013-04-10 ENCOUNTER — Ambulatory Visit (INDEPENDENT_AMBULATORY_CARE_PROVIDER_SITE_OTHER): Payer: Medicare Other | Admitting: Cardiology

## 2013-04-10 VITALS — BP 118/70 | HR 67 | Ht 67.0 in | Wt 175.0 lb

## 2013-04-10 DIAGNOSIS — I739 Peripheral vascular disease, unspecified: Secondary | ICD-10-CM

## 2013-04-10 DIAGNOSIS — E785 Hyperlipidemia, unspecified: Secondary | ICD-10-CM

## 2013-04-10 DIAGNOSIS — I10 Essential (primary) hypertension: Secondary | ICD-10-CM

## 2013-04-10 DIAGNOSIS — I2510 Atherosclerotic heart disease of native coronary artery without angina pectoris: Secondary | ICD-10-CM

## 2013-04-10 NOTE — Patient Instructions (Signed)
Your physician wants you to follow-up in: ONE YEAR WITH DR CRENSHAW You will receive a reminder letter in the mail two months in advance. If you don't receive a letter, please call our office to schedule the follow-up appointment.  

## 2013-04-10 NOTE — Assessment & Plan Note (Signed)
Continue aspirin and statin. His claudication is not limiting his activities.

## 2013-04-10 NOTE — Assessment & Plan Note (Signed)
Continue aspirin and statin. 

## 2013-04-10 NOTE — Assessment & Plan Note (Signed)
Continue statin. 

## 2013-04-10 NOTE — Assessment & Plan Note (Signed)
Continue present blood pressure medications. 

## 2013-04-10 NOTE — Progress Notes (Signed)
HPI: FU CAD. Patient was admitted to [facility] in March of 2011 following a non-ST elevation myocardial infarction. Cardiac catheterization was performed at that time and revealed a 25% LM. The LAD had heavy calcification with long proximal 50% stenosis. There was mid 50% stenosis. There was distal 60% stenosis. First diagonal was moderate sized with luminal irregularities. The circumflex in the AV groove had proximal 30% stenosis. There was a mid obtuse marginal, which was a moderate-sized branching vessel. The inferior branch was subtotally stenosed with TIMI 1 to 2 flow. The right coronary artery had a proximal 95% stenosis and mid occlusion. The EF was 65% with mild inferobasilar akinesis. Patient had PCI (balloon angioplasty only) of the subtotal branch of the marginal. I last saw him in March 2012. Since then, the patient has dyspnea with activities relieved with rest. It is not associated with chest pain. There is no orthopnea, PND or pedal edema. There is no syncope or palpitations. There is no exertional chest pain.   Current Outpatient Prescriptions  Medication Sig Dispense Refill  . albuterol (PROAIR HFA) 108 (90 BASE) MCG/ACT inhaler Inhale 2 puffs into the lungs every 4 (four) hours as needed for wheezing.  1 Inhaler  5  . amLODipine (NORVASC) 5 MG tablet take 1 tablet by mouth once daily  30 tablet  1  . aspirin 325 MG EC tablet Take 325 mg by mouth daily.        . cholecalciferol (VITAMIN D) 1000 UNITS tablet Take 1,000 Units by mouth daily.        Marland Kitchen donepezil (ARICEPT) 10 MG tablet Take 10 mg by mouth daily.      . famotidine (PEPCID) 20 MG tablet Take 20 mg by mouth as needed. For heart burn      . Fluticasone-Salmeterol (ADVAIR DISKUS) 250-50 MCG/DOSE AEPB Inhale 1 puff into the lungs 2 (two) times daily.  60 each  11  . ibuprofen (ADVIL,MOTRIN) 200 MG tablet Take 400 mg by mouth every 8 (eight) hours as needed. For pain.      Marland Kitchen leflunomide (ARAVA) 10 MG tablet Take  20 mg by mouth daily.       . nitroGLYCERIN (NITROSTAT) 0.4 MG SL tablet Place 0.4 mg under the tongue every 5 (five) minutes x 3 doses as needed. For chest pain.      . pravastatin (PRAVACHOL) 40 MG tablet take 1 tablet by mouth at bedtime  30 tablet  12  . roflumilast (DALIRESP) 500 MCG TABS tablet Take 500 mcg by mouth every other day.       . traZODone (DESYREL) 50 MG tablet Take 50 mg by mouth at bedtime as needed. For sleep.       No current facility-administered medications for this visit.    Allergies  Allergen Reactions  . Iohexol      Desc: Increased difficulty breathing, Onset Date: [id number]   . Penicillins   . Zolpidem Tartrate     REACTION: Acute delirium    Past Medical History  Diagnosis Date  . Peripheral vascular disease   . Osteoarthritis   . Hypertension   . Hyperlipidemia   . CAD (coronary artery disease)     Non stemi 3/11; PTCA first OM 3/11  . COPD (chronic obstructive pulmonary disease)   . History of colonic polyps   . Dementia   . Back pain   . Rheumatoid arthritis(714.0) 2013    [name]     Past Surgical History  Procedure Laterality Date  . Iliac artery stent      Stenting of left common iliac and external iliac  . Coronary angioplasty  08/2009    First OM    History   Social History  . Marital Status: Married    Spouse Name: N/A    Number of Children: N/A  . Years of Education: N/A   Occupational History  . Part time, not physical work    Social History Main Topics  . Smoking status: Former Smoker -- 1.50 packs/day for 30 years    Types: Cigarettes    Quit date: 06/13/1985  . Smokeless tobacco: Never Used  . Alcohol Use: No     Comment: Denies binge drinking  . Drug Use: No  . Sexual Activity: Not on file   Other Topics Concern  . Not on file   Social History Narrative   Lives in [place] with wife   No herbal medications   Regular diet   No regular exercise    No family history on file.  ROS: some hip pain  with ambulation but no fevers or chills, productive cough, hemoptysis, dysphasia, odynophagia, melena, hematochezia, dysuria, hematuria, rash, seizure activity, orthopnea, PND, pedal edema. Remaining systems are negative.  Physical Exam:   Blood pressure 118/70, pulse 67, height 5\' 7"  (1.702 m), weight 175 lb (79.379 kg).  General:  Well developed/well nourished in NAD Skin warm/dry Patient not depressed No peripheral clubbing Back-normal HEENT-normal/normal eyelids Neck supple/normal carotid upstroke bilaterally; no bruits; no JVD; no thyromegaly chest - diminished breath sounds throughout CV - distant heart sounds, RRR/normal S1 and S2; no murmurs, rubs or gallops;  PMI nondisplaced Abdomen -NT/ND, no HSM, no mass, + bowel sounds, no bruit 2+ femoral pulses, no bruits Ext-no edema, chords, 2+ DP Neuro-grossly nonfocal  ECG sinus rhythm at a rate of 67. No ST changes.

## 2013-04-12 ENCOUNTER — Other Ambulatory Visit: Payer: Self-pay | Admitting: Cardiology

## 2013-10-21 IMAGING — CR DG CHEST 2V
2 series · 2 of 2 positions shown · non-contrast
Comparison: August 31, 2009 and May 14, 2009

CLINICAL DATA: Cough, congestion, COPD, hypertension

CHEST - 2 VIEW

[w chest pa]
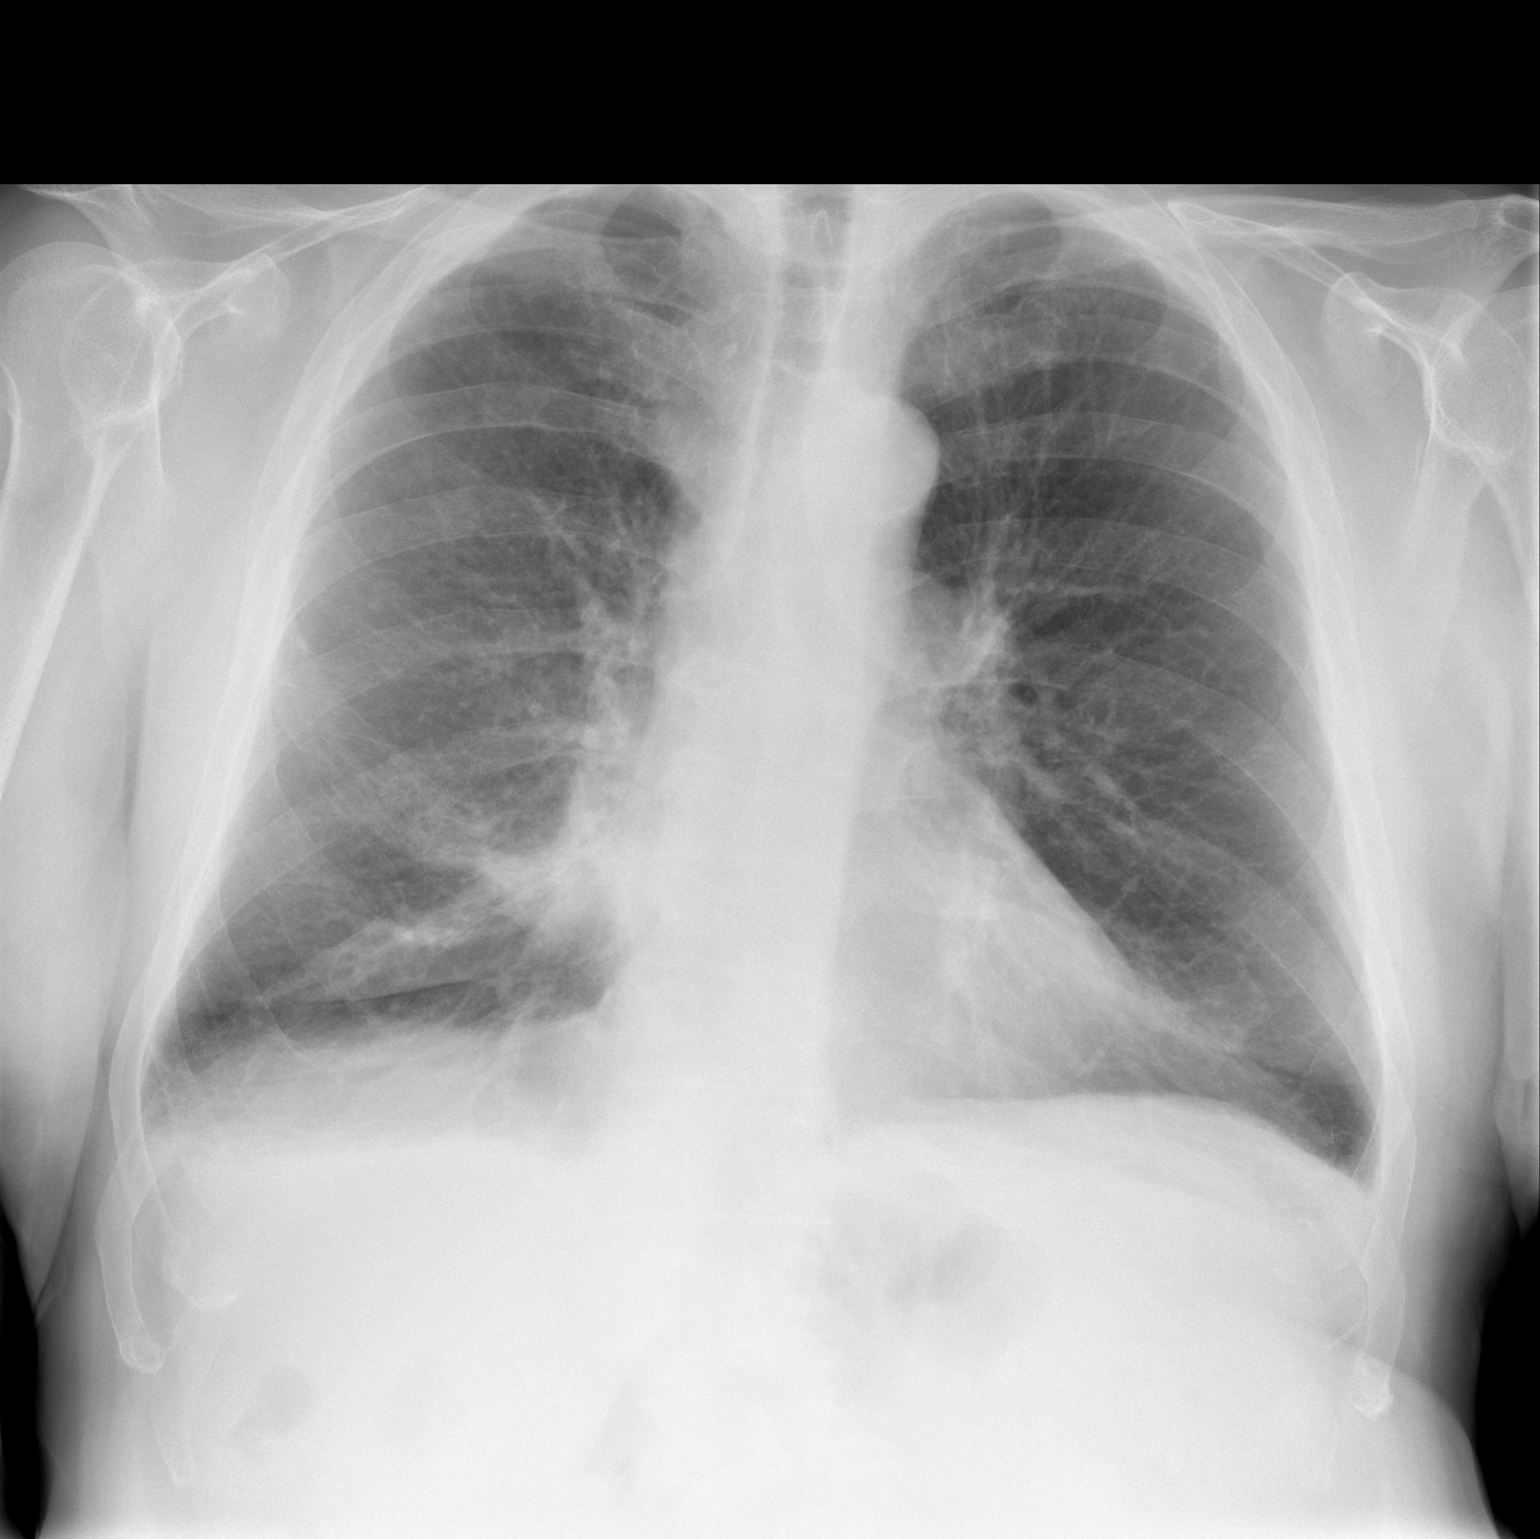

[w chest lat]
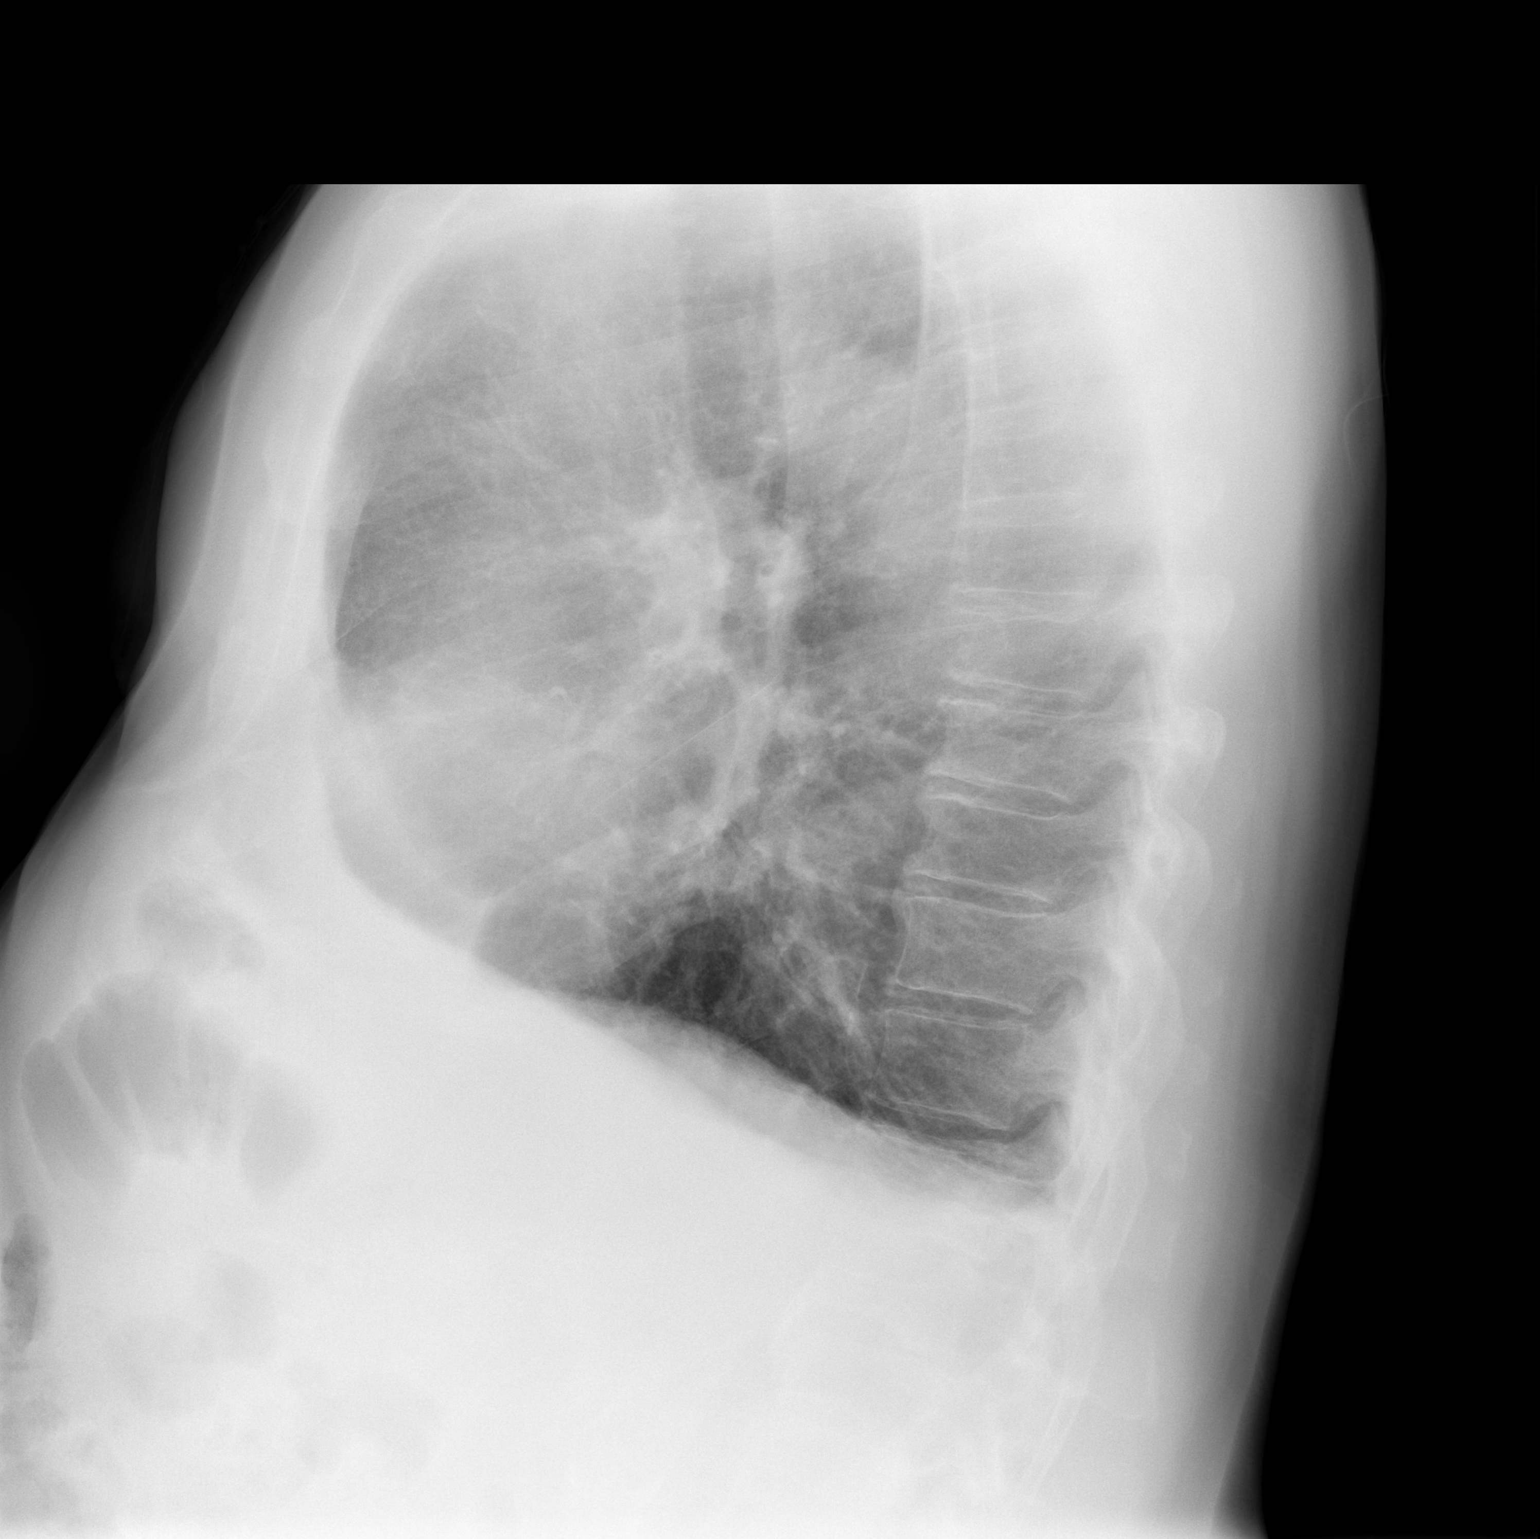

[2 of 2 positions shown; findings below may reference images not displayed]

FINDINGS: There is persistent atelectasis and scarring within the
right middle lobe and right lower lobe which is unchanged in
appearance.  The left lung remains clear.  The cardiac silhouette,
mediastinum, pulmonary vasculature are within normal limits.
IMPRESSION: There is no evidence of acute cardiac or pulmonary process.

Stable appearing atelectasis and scarring in the right middle and
lower lobes.

## 2013-10-29 ENCOUNTER — Other Ambulatory Visit: Payer: Self-pay | Admitting: Cardiology

## 2013-12-19 ENCOUNTER — Other Ambulatory Visit: Payer: Self-pay | Admitting: *Deleted

## 2013-12-19 MED ORDER — ALBUTEROL SULFATE HFA 108 (90 BASE) MCG/ACT IN AERS: 2.0000 | INHALATION_SPRAY | RESPIRATORY_TRACT | Status: DC | PRN

## 2014-02-20 ENCOUNTER — Ambulatory Visit (INDEPENDENT_AMBULATORY_CARE_PROVIDER_SITE_OTHER): Payer: Medicare Other | Admitting: Critical Care Medicine

## 2014-02-20 ENCOUNTER — Encounter: Payer: Self-pay | Admitting: Critical Care Medicine

## 2014-02-20 VITALS — BP 126/80 | HR 86 | Temp 97.8°F | Ht 67.0 in | Wt 169.0 lb

## 2014-02-20 DIAGNOSIS — J449 Chronic obstructive pulmonary disease, unspecified: Secondary | ICD-10-CM

## 2014-02-20 MED ORDER — AZITHROMYCIN 250 MG PO TABS: ORAL_TABLET | ORAL | Status: DC

## 2014-02-20 MED ORDER — METHYLPREDNISOLONE ACETATE 80 MG/ML IJ SUSP
80.0000 mg | Freq: Once | INTRAMUSCULAR | Status: AC
  Administered 2014-02-20: 80 mg via INTRAMUSCULAR

## 2014-02-20 NOTE — Assessment & Plan Note (Signed)
Gold stage B. COPD with recurrent acute exacerbation Plan Azithromycin 250mg  Take two once then one daily until gone A Depomedrol 120mg  IM injection was given No change in advair Return 6 months Get flu vaccine this fall

## 2014-02-20 NOTE — Progress Notes (Signed)
Subjective:    Patient ID: [name] [name], male    DOB: 12/13/1931, 80 y.o.   MRN: [id number]  HPI 02/20/2014 Chief Complaint  Patient presents with  . COPD    follow-up. Pt is c/o having increased SOB, chest tightness, and productive cough with clear phlegm x 2 months.   Notes more dyspnea over time.  No chest pain.  Mucus is milky clear.  Notes more dizziness.  Pt denies falls.     Review of Systems Constitutional:   No  weight loss, night sweats,  Fevers, chills, fatigue, lassitude. HEENT:   No headaches,  Difficulty swallowing,  Tooth/dental problems,  Sore throat,                No sneezing, itching, ear ache, nasal congestion, post nasal drip,   CV:  No chest pain,  Orthopnea, PND, swelling in lower extremities, anasarca, dizziness, palpitations  GI  No heartburn, indigestion, abdominal pain, nausea, vomiting, diarrhea, change in bowel habits, loss of appetite  Resp: No shortness of breath with exertion or at rest.  No excess mucus, no productive cough,  No non-productive cough,  No coughing up of blood.  No change in color of mucus.  No wheezing.  No chest wall deformity  Skin: no rash or lesions.  GU: no dysuria, change in color of urine, no urgency or frequency.  No flank pain.  MS:  No joint pain or swelling.  No decreased range of motion.  No back pain.  Psych:  No change in mood or affect. No depression or anxiety.  No memory loss.     Objective:   Physical Exam Filed Vitals:   02/20/14 0915  BP: 126/80  Pulse: 86  Temp: 97.8 F (36.6 C)  TempSrc: Oral  Height: 5\' 7"  (1.702 m)  Weight: 169 lb (76.658 kg)  SpO2: 94%    Gen: Pleasant, well-nourished, in no distress,  normal affect  ENT: No lesions,  mouth clear,  oropharynx clear, no postnasal drip  Neck: No JVD, no TMG, no carotid bruits  Lungs: No use of accessory muscles, no dullness to percussion, exp wheezes  Cardiovascular: RRR, heart sounds normal, no murmur or gallops, no peripheral  edema  Abdomen: soft and NT, no HSM,  BS normal  Musculoskeletal: No deformities, no cyanosis or clubbing  Neuro: alert, non focal  Skin: Warm, no lesions or rashes  No results found.        Assessment & Plan:   Obstructive chronic bronchitis without exacerbation Gold B Gold stage B. COPD with recurrent acute exacerbation Plan Azithromycin 250mg  Take two once then one daily until gone A Depomedrol 120mg  IM injection was given No change in advair Return 6 months Get flu vaccine this fall    Updated Medication List Outpatient Encounter Prescriptions as of 02/20/2014  Medication Sig  . albuterol (PROAIR HFA) 108 (90 BASE) MCG/ACT inhaler Inhale 2 puffs into the lungs every 4 (four) hours as needed for wheezing.  aspirin 325 MG EC tablet Take 325 mg by mouth daily.    . cholecalciferol (VITAMIN D) 1000 UNITS tablet Take 1,000 Units by mouth daily.    co-enzyme Q-10 30 MG capsule Take 30 mg by mouth 3 (three) times daily.  04/22/2014 donepezil (ARICEPT) 10 MG tablet Take 10 mg by mouth daily.  . famotidine (PEPCID) 20 MG tablet Take 20 mg by mouth as needed. For heart burn  . Fluticasone-Salmeterol (ADVAIR DISKUS) 250-50 MCG/DOSE AEPB Inhale 1 puff into  the lungs 2 (two) times daily.  Marland Kitchen ibuprofen (ADVIL,MOTRIN) 200 MG tablet Take 400 mg by mouth every 8 (eight) hours as needed. For pain.  Marland Kitchen leflunomide (ARAVA) 10 MG tablet Take 20 mg by mouth daily.   . nitroGLYCERIN (NITROSTAT) 0.4 MG SL tablet Place 0.4 mg under the tongue every 5 (five) minutes x 3 doses as needed. For chest pain.  . pravastatin (PRAVACHOL) 40 MG tablet take 1 tablet by mouth at bedtime  . traZODone (DESYREL) 50 MG tablet Take 50 mg by mouth at bedtime as needed. For sleep.  . [DISCONTINUED] amLODipine (NORVASC) 5 MG tablet Take 1 tablet (5 mg total) by mouth daily.  Marland Kitchen azithromycin (ZITHROMAX) 250 MG tablet Take two once then one daily until gone  . [DISCONTINUED] roflumilast (DALIRESP) 500 MCG TABS tablet Take  500 mcg by mouth every other day.   . [EXPIRED] methylPREDNISolone acetate (DEPO-MEDROL) injection 80 mg

## 2014-02-20 NOTE — Patient Instructions (Signed)
Azithromycin 250mg  Take two once then one daily until gone A Depomedrol 120mg  IM injection was given No change in advair Return 6 months Get flu vaccine this fall

## 2014-04-12 ENCOUNTER — Emergency Department (HOSPITAL_COMMUNITY): Payer: Medicare Other

## 2014-04-12 ENCOUNTER — Encounter (HOSPITAL_COMMUNITY): Payer: Self-pay | Admitting: Emergency Medicine

## 2014-04-12 ENCOUNTER — Inpatient Hospital Stay (HOSPITAL_COMMUNITY)
Admission: EM | Admit: 2014-04-12 | Discharge: 2014-04-15 | DRG: 194 | Disposition: A | Discharge status: Home / Self Care | Payer: Medicare Other | Attending: Internal Medicine | Admitting: Internal Medicine

## 2014-04-12 DIAGNOSIS — M069 Rheumatoid arthritis, unspecified: Secondary | ICD-10-CM | POA: Diagnosis present

## 2014-04-12 DIAGNOSIS — I2510 Atherosclerotic heart disease of native coronary artery without angina pectoris: Secondary | ICD-10-CM | POA: Diagnosis present

## 2014-04-12 DIAGNOSIS — Z87891 Personal history of nicotine dependence: Secondary | ICD-10-CM | POA: Diagnosis not present

## 2014-04-12 DIAGNOSIS — Z7982 Long term (current) use of aspirin: Secondary | ICD-10-CM | POA: Diagnosis not present

## 2014-04-12 DIAGNOSIS — I10 Essential (primary) hypertension: Secondary | ICD-10-CM | POA: Diagnosis present

## 2014-04-12 DIAGNOSIS — J189 Pneumonia, unspecified organism: Secondary | ICD-10-CM | POA: Diagnosis present

## 2014-04-12 DIAGNOSIS — G309 Alzheimer's disease, unspecified: Secondary | ICD-10-CM | POA: Diagnosis present

## 2014-04-12 DIAGNOSIS — J441 Chronic obstructive pulmonary disease with (acute) exacerbation: Secondary | ICD-10-CM | POA: Diagnosis present

## 2014-04-12 DIAGNOSIS — I739 Peripheral vascular disease, unspecified: Secondary | ICD-10-CM | POA: Diagnosis present

## 2014-04-12 DIAGNOSIS — J449 Chronic obstructive pulmonary disease, unspecified: Secondary | ICD-10-CM | POA: Diagnosis present

## 2014-04-12 DIAGNOSIS — Z79899 Other long term (current) drug therapy: Secondary | ICD-10-CM

## 2014-04-12 DIAGNOSIS — Z9861 Coronary angioplasty status: Secondary | ICD-10-CM | POA: Diagnosis not present

## 2014-04-12 DIAGNOSIS — R0602 Shortness of breath: Secondary | ICD-10-CM

## 2014-04-12 DIAGNOSIS — Z880 Allergy status to penicillin: Secondary | ICD-10-CM | POA: Diagnosis not present

## 2014-04-12 DIAGNOSIS — M1990 Unspecified osteoarthritis, unspecified site: Secondary | ICD-10-CM | POA: Diagnosis present

## 2014-04-12 DIAGNOSIS — J42 Unspecified chronic bronchitis: Secondary | ICD-10-CM | POA: Diagnosis present

## 2014-04-12 DIAGNOSIS — E785 Hyperlipidemia, unspecified: Secondary | ICD-10-CM | POA: Diagnosis present

## 2014-04-12 DIAGNOSIS — Z66 Do not resuscitate: Secondary | ICD-10-CM | POA: Diagnosis present

## 2014-04-12 DIAGNOSIS — F0280 Dementia in other diseases classified elsewhere without behavioral disturbance: Secondary | ICD-10-CM | POA: Diagnosis present

## 2014-04-12 LAB — CBC WITH DIFFERENTIAL/PLATELET
BASOS PCT: 0 % (ref 0–1)
Basophils Absolute: 0.1 10*3/uL (ref 0.0–0.1)
EOS ABS: 0.6 10*3/uL (ref 0.0–0.7)
EOS PCT: 4 % (ref 0–5)
HCT: 39.3 % (ref 39.0–52.0)
HEMOGLOBIN: 12.6 g/dL — AB (ref 13.0–17.0)
LYMPHS ABS: 2.2 10*3/uL (ref 0.7–4.0)
Lymphocytes Relative: 13 % (ref 12–46)
MCH: 27.6 pg (ref 26.0–34.0)
MCHC: 32.1 g/dL (ref 30.0–36.0)
MCV: 86 fL (ref 78.0–100.0)
MONOS PCT: 5 % (ref 3–12)
Monocytes Absolute: 0.8 10*3/uL (ref 0.1–1.0)
NEUTROS PCT: 78 % — AB (ref 43–77)
Neutro Abs: 12.8 10*3/uL — ABNORMAL HIGH (ref 1.7–7.7)
Platelets: 172 10*3/uL (ref 150–400)
RBC: 4.57 MIL/uL (ref 4.22–5.81)
RDW: 14.9 % (ref 11.5–15.5)
WBC: 16.5 10*3/uL — ABNORMAL HIGH (ref 4.0–10.5)

## 2014-04-12 LAB — HIV ANTIBODY (ROUTINE TESTING W REFLEX): HIV 1&2 Ab, 4th Generation: NONREACTIVE

## 2014-04-12 LAB — BASIC METABOLIC PANEL
Anion gap: 12 (ref 5–15)
BUN: 10 mg/dL (ref 6–23)
CALCIUM: 8.8 mg/dL (ref 8.4–10.5)
CO2: 24 mEq/L (ref 19–32)
CREATININE: 0.93 mg/dL (ref 0.50–1.35)
Chloride: 100 mEq/L (ref 96–112)
GFR, EST AFRICAN AMERICAN: 90 mL/min — AB (ref >90)
GFR, EST NON AFRICAN AMERICAN: 77 mL/min — AB (ref >90)
GLUCOSE: 119 mg/dL — AB (ref 70–99)
Potassium: 3.9 mEq/L (ref 3.7–5.3)
Sodium: 136 mEq/L — ABNORMAL LOW (ref 137–147)

## 2014-04-12 LAB — EXPECTORATED SPUTUM ASSESSMENT W GRAM STAIN, RFLX TO RESP C

## 2014-04-12 LAB — PRO B NATRIURETIC PEPTIDE: Pro B Natriuretic peptide (BNP): 616.1 pg/mL — ABNORMAL HIGH (ref 0–450)

## 2014-04-12 LAB — EXPECTORATED SPUTUM ASSESSMENT W REFEX TO RESP CULTURE

## 2014-04-12 MED ORDER — FAMOTIDINE 20 MG PO TABS
20.0000 mg | ORAL_TABLET | Freq: Every day | ORAL | Status: DC | PRN
  Filled 2014-04-12: qty 1

## 2014-04-12 MED ORDER — LEVOFLOXACIN IN D5W 750 MG/150ML IV SOLN: 750.0000 mg | INTRAVENOUS | Status: DC

## 2014-04-12 MED ORDER — ALBUTEROL SULFATE (2.5 MG/3ML) 0.083% IN NEBU
2.5000 mg | INHALATION_SOLUTION | RESPIRATORY_TRACT | Status: DC | PRN
  Administered 2014-04-13 – 2014-04-15 (×2): 2.5 mg via RESPIRATORY_TRACT
  Filled 2014-04-12 (×2): qty 3

## 2014-04-12 MED ORDER — TRAZODONE HCL 50 MG PO TABS: 50.0000 mg | ORAL_TABLET | Freq: Every evening | ORAL | Status: DC | PRN

## 2014-04-12 MED ORDER — METHYLPREDNISOLONE SODIUM SUCC 125 MG IJ SOLR
60.0000 mg | Freq: Four times a day (QID) | INTRAMUSCULAR | Status: DC
  Administered 2014-04-12 – 2014-04-13 (×4): 60 mg via INTRAVENOUS
  Filled 2014-04-12 (×8): qty 0.96

## 2014-04-12 MED ORDER — SODIUM CHLORIDE 0.9 % IV SOLN
INTRAVENOUS | Status: DC
  Administered 2014-04-12: 20 mL/h via INTRAVENOUS

## 2014-04-12 MED ORDER — IPRATROPIUM-ALBUTEROL 0.5-2.5 (3) MG/3ML IN SOLN
3.0000 mL | RESPIRATORY_TRACT | Status: DC
  Administered 2014-04-12 (×3): 3 mL via RESPIRATORY_TRACT
  Filled 2014-04-12 (×3): qty 3

## 2014-04-12 MED ORDER — SODIUM CHLORIDE 0.9 % IJ SOLN
3.0000 mL | Freq: Two times a day (BID) | INTRAMUSCULAR | Status: DC
  Administered 2014-04-12 – 2014-04-13 (×2): 3 mL via INTRAVENOUS

## 2014-04-12 MED ORDER — ACETAMINOPHEN 500 MG PO TABS: 1000.0000 mg | ORAL_TABLET | Freq: Every day | ORAL | Status: DC | PRN

## 2014-04-12 MED ORDER — ONDANSETRON HCL 4 MG/2ML IJ SOLN: 4.0000 mg | Freq: Four times a day (QID) | INTRAMUSCULAR | Status: DC | PRN

## 2014-04-12 MED ORDER — ALBUTEROL SULFATE HFA 108 (90 BASE) MCG/ACT IN AERS: 2.0000 | INHALATION_SPRAY | RESPIRATORY_TRACT | Status: DC | PRN

## 2014-04-12 MED ORDER — LEFLUNOMIDE 20 MG PO TABS
20.0000 mg | ORAL_TABLET | Freq: Every day | ORAL | Status: DC
  Administered 2014-04-12 – 2014-04-15 (×4): 20 mg via ORAL
  Filled 2014-04-12 (×4): qty 1

## 2014-04-12 MED ORDER — IPRATROPIUM-ALBUTEROL 0.5-2.5 (3) MG/3ML IN SOLN: 3.0000 mL | RESPIRATORY_TRACT | Status: DC | PRN

## 2014-04-12 MED ORDER — COENZYME Q10 30 MG PO CAPS: 30.0000 mg | ORAL_CAPSULE | Freq: Three times a day (TID) | ORAL | Status: DC

## 2014-04-12 MED ORDER — ASPIRIN EC 325 MG PO TBEC
325.0000 mg | DELAYED_RELEASE_TABLET | Freq: Every day | ORAL | Status: DC
  Administered 2014-04-12 – 2014-04-15 (×4): 325 mg via ORAL
  Filled 2014-04-12 (×4): qty 1

## 2014-04-12 MED ORDER — ACETAMINOPHEN 325 MG PO TABS
650.0000 mg | ORAL_TABLET | Freq: Four times a day (QID) | ORAL | Status: DC | PRN
  Administered 2014-04-14: 650 mg via ORAL
  Filled 2014-04-12: qty 2

## 2014-04-12 MED ORDER — VITAMIN D3 25 MCG (1000 UNIT) PO TABS
1000.0000 [IU] | ORAL_TABLET | Freq: Every day | ORAL | Status: DC
  Administered 2014-04-12 – 2014-04-15 (×4): 1000 [IU] via ORAL
  Filled 2014-04-12 (×4): qty 1

## 2014-04-12 MED ORDER — ACETAMINOPHEN 650 MG RE SUPP: 650.0000 mg | Freq: Four times a day (QID) | RECTAL | Status: DC | PRN

## 2014-04-12 MED ORDER — IPRATROPIUM-ALBUTEROL 0.5-2.5 (3) MG/3ML IN SOLN
3.0000 mL | Freq: Three times a day (TID) | RESPIRATORY_TRACT | Status: DC
  Administered 2014-04-13 – 2014-04-15 (×6): 3 mL via RESPIRATORY_TRACT
  Filled 2014-04-12 (×6): qty 3

## 2014-04-12 MED ORDER — PRAVASTATIN SODIUM 40 MG PO TABS
40.0000 mg | ORAL_TABLET | Freq: Every day | ORAL | Status: DC
  Administered 2014-04-12 – 2014-04-15 (×4): 40 mg via ORAL
  Filled 2014-04-12 (×4): qty 1

## 2014-04-12 MED ORDER — LEVOFLOXACIN IN D5W 750 MG/150ML IV SOLN
750.0000 mg | INTRAVENOUS | Status: DC
  Administered 2014-04-12: 750 mg via INTRAVENOUS
  Filled 2014-04-12 (×2): qty 150

## 2014-04-12 MED ORDER — DONEPEZIL HCL 10 MG PO TABS
10.0000 mg | ORAL_TABLET | Freq: Every day | ORAL | Status: DC
  Administered 2014-04-12 – 2014-04-15 (×4): 10 mg via ORAL
  Filled 2014-04-12 (×4): qty 1

## 2014-04-12 MED ORDER — IPRATROPIUM-ALBUTEROL 0.5-2.5 (3) MG/3ML IN SOLN: 3.0000 mL | Freq: Four times a day (QID) | RESPIRATORY_TRACT | Status: DC | PRN

## 2014-04-12 MED ORDER — HEPARIN SODIUM (PORCINE) 5000 UNIT/ML IJ SOLN
5000.0000 [IU] | Freq: Three times a day (TID) | INTRAMUSCULAR | Status: DC
  Administered 2014-04-12 – 2014-04-14 (×4): 5000 [IU] via SUBCUTANEOUS
  Filled 2014-04-12 (×12): qty 1

## 2014-04-12 MED ORDER — ONDANSETRON HCL 4 MG PO TABS
4.0000 mg | ORAL_TABLET | Freq: Four times a day (QID) | ORAL | Status: DC | PRN
Start: 2014-04-12 — End: 2014-04-15

## 2014-04-12 MED ORDER — MORPHINE SULFATE 2 MG/ML IJ SOLN: 2.0000 mg | INTRAMUSCULAR | Status: DC | PRN

## 2014-04-12 MED ORDER — FAMOTIDINE 20 MG PO TABS
20.0000 mg | ORAL_TABLET | ORAL | Status: DC | PRN
  Filled 2014-04-12: qty 1

## 2014-04-12 NOTE — ED Provider Notes (Signed)
CSN: [id number]     Arrival date & time 04/12/14  1020 History   First MD Initiated Contact with Patient 04/12/14 1025     Chief Complaint  Patient presents with  . Shortness of Breath     (Consider location/radiation/quality/duration/timing/severity/associated sxs/prior Treatment) HPI Comments: Patient has long-standing history of COPD and has had worsening trouble breathing over the past week. Now productive cough. No anginal type chest pain. No CHF type symptoms. Has been using his home inhalers without relief. Called EMS and was given albuterol 10 mg along with Atrovent. Also given his Solu-Medrol 125 IV push. His wheezing has improved and his breathing is better. Denies any PE type symptoms.  Patient is a 80 y.o. male presenting with shortness of breath. The history is provided by the patient.  Shortness of Breath Severity:  Moderate Onset quality:  Gradual Timing:  Constant Progression:  Worsening Chronicity:  Recurrent Relieved by:  Inhaler Worsened by:  Nothing tried Ineffective treatments:  Inhaler Associated symptoms: cough, sputum production and wheezing   Associated symptoms: no chest pain, no fever and no vomiting     Past Medical History  Diagnosis Date  . Peripheral vascular disease   . Osteoarthritis   . Hypertension   . Hyperlipidemia   . CAD (coronary artery disease)     Non stemi 3/11; PTCA first OM 3/11  . COPD (chronic obstructive pulmonary disease)   . History of colonic polyps   . Dementia   . Back pain   . Rheumatoid arthritis(714.0) 2013    [name]    Past Surgical History  Procedure Laterality Date  . Iliac artery stent      Stenting of left common iliac and external iliac  . Coronary angioplasty  08/2009    First OM   No family history on file. History  Substance Use Topics  . Smoking status: Former Smoker -- 1.50 packs/day for 30 years    Types: Cigarettes    Quit date: 06/13/1985  . Smokeless tobacco: Never Used  . Alcohol  Use: No     Comment: Denies binge drinking    Review of Systems  Constitutional: Negative for fever.  Respiratory: Positive for cough, sputum production, shortness of breath and wheezing.   Cardiovascular: Negative for chest pain.  Gastrointestinal: Negative for vomiting.  All other systems reviewed and are negative.     Allergies  Iohexol; Penicillins; and Zolpidem tartrate  Home Medications   Prior to Admission medications   Medication Sig Start Date End Date Taking? Authorizing Provider  albuterol (PROAIR HFA) 108 (90 BASE) MCG/ACT inhaler Inhale 2 puffs into the lungs every 4 (four) hours as needed for wheezing. 12/19/13   Storm Frisk, MD  aspirin 325 MG EC tablet Take 325 mg by mouth daily.      Historical Provider, MD  azithromycin (ZITHROMAX) 250 MG tablet Take two once then one daily until gone 02/20/14   Storm Frisk, MD  cholecalciferol (VITAMIN D) 1000 UNITS tablet Take 1,000 Units by mouth daily.      Historical Provider, MD  co-enzyme Q-10 30 MG capsule Take 30 mg by mouth 3 (three) times daily.    Historical Provider, MD  donepezil (ARICEPT) 10 MG tablet Take 10 mg by mouth daily.    Historical Provider, MD  famotidine (PEPCID) 20 MG tablet Take 20 mg by mouth as needed. For heart burn    Historical Provider, MD  Fluticasone-Salmeterol (ADVAIR DISKUS) 250-50 MCG/DOSE AEPB Inhale 1 puff into the  lungs 2 (two) times daily. 03/25/13 03/25/14  Storm Frisk, MD  ibuprofen (ADVIL,MOTRIN) 200 MG tablet Take 400 mg by mouth every 8 (eight) hours as needed. For pain.    Historical Provider, MD  leflunomide (ARAVA) 10 MG tablet Take 20 mg by mouth daily.     Historical Provider, MD  nitroGLYCERIN (NITROSTAT) 0.4 MG SL tablet Place 0.4 mg under the tongue every 5 (five) minutes x 3 doses as needed. For chest pain.    Historical Provider, MD  pravastatin (PRAVACHOL) 40 MG tablet take 1 tablet by mouth at bedtime    Wendall Stade, MD  traZODone (DESYREL) 50 MG tablet Take  50 mg by mouth at bedtime as needed. For sleep.    Historical Provider, MD   BP 132/59  Pulse 78  Temp(Src) 97.2 F (36.2 C) (Axillary)  Resp 22  SpO2 97% Physical Exam  Nursing note and vitals reviewed. Constitutional: He is oriented to person, place, and time. He appears well-developed and well-nourished.  Non-toxic appearance. No distress.  HENT:  Head: Normocephalic and atraumatic.  Eyes: Conjunctivae, EOM and lids are normal. Pupils are equal, round, and reactive to light.  Neck: Normal range of motion. Neck supple. No tracheal deviation present. No mass present.  Cardiovascular: Normal rate, regular rhythm and normal heart sounds.  Exam reveals no gallop.   No murmur heard. Pulmonary/Chest: Effort normal. No stridor. No respiratory distress. He has decreased breath sounds. He has wheezes. He has no rhonchi. He has no rales.  Abdominal: Soft. Normal appearance and bowel sounds are normal. He exhibits no distension. There is no tenderness. There is no rebound and no CVA tenderness.  Musculoskeletal: Normal range of motion. He exhibits no edema and no tenderness.  Neurological: He is alert and oriented to person, place, and time. He has normal strength. No cranial nerve deficit or sensory deficit. GCS eye subscore is 4. GCS verbal subscore is 5. GCS motor subscore is 6.  Skin: Skin is warm and dry. No abrasion and no rash noted.  Psychiatric: He has a normal mood and affect. His speech is normal and behavior is normal.    ED Course  Procedures (including critical care time) Labs Review Labs Reviewed  CBC WITH DIFFERENTIAL  BASIC METABOLIC PANEL  PRO B NATRIURETIC PEPTIDE    Imaging Review No results found.   EKG Interpretation None      MDM   Final diagnoses:  SOB (shortness of breath)    Patient's chest x-ray consistent with pneumonia. He was started on IV Levaquin and will be admitted to [facility]    Toy Baker, MD 04/12/14 1221

## 2014-04-12 NOTE — ED Notes (Signed)
Per EMS pt increasing SOB unrelieved by nebulizer treatment at home. Pt hx of COPD. Pt given 10 mg albuterol, 1 mg Atrovent, and 125 mg solu medrol en route.

## 2014-04-12 NOTE — ED Notes (Signed)
Bed: MO70 Expected date: 04/12/14 Expected time: 10:24 AM Means of arrival: Ambulance Comments: Shortness of breath

## 2014-04-12 NOTE — H&P (Signed)
[facility] History and Physical  [name] [name] FBP:[id number] DOB: 04-09-1932 DOA: 04/12/2014  Referring physician: Emergency Department PCP: Hoyle Sauer, MD  Specialists:   Chief Complaint: SOB  HPI: [name] [name] is a 80 y.o. male  With a hx of copd followed by Dr. Delford Field, CAD, osteoarthritis, dementia who presents with worsening sob, wheezing, and productive cough over the past week. Pt reports no significant improvement with home bronchodilators. No recent hospitalizations over the past 3 months. In the ED, pt was found to have a mild leukocytosis of 16.5k with cxr findings of R middle and lower lobe PNA. Pt was started on levaquin and hospitalist consulted for admission. Of note, pt was noted by ED to be wheezing s/p neb tx en route to the ED.  Review of Systems:   Per above, the remainder of the 10pt ros reviewed and are neg  Past Medical History  Diagnosis Date  . Peripheral vascular disease   . Osteoarthritis   . Hypertension   . Hyperlipidemia   . CAD (coronary artery disease)     Non stemi 3/11; PTCA first OM 3/11  . COPD (chronic obstructive pulmonary disease)   . History of colonic polyps   . Dementia   . Back pain   . Rheumatoid arthritis(714.0) 2013    [name] [name]    Past Surgical History  Procedure Laterality Date  . Iliac artery stent      Stenting of left common iliac and external iliac  . Coronary angioplasty  08/2009    First OM   Social History:  reports that he quit smoking about 28 years ago. His smoking use included Cigarettes. He has a 45 pack-year smoking history. He has never used smokeless tobacco. He reports that he does not drink alcohol or use illicit drugs.  where does patient live--home, ALF, SNF? and with whom if at home?  Can patient participate in ADLs?  Allergies  Allergen Reactions  . Iohexol      Desc: Increased difficulty breathing, Onset Date: [id number]   . Penicillins   . Zolpidem Tartrate     REACTION:  Acute delirium    No family history on file. Reviewed and is noncontributory to this case (be sure to complete)  Prior to Admission medications   Medication Sig Start Date End Date Taking? Authorizing Provider  acetaminophen (TYLENOL) 500 MG tablet Take 1,000 mg by mouth daily as needed for mild pain or headache.   Yes Historical Provider, MD  albuterol (PROAIR HFA) 108 (90 BASE) MCG/ACT inhaler Inhale 2 puffs into the lungs every 4 (four) hours as needed for wheezing. 12/19/13  Yes Storm Frisk, MD  aspirin 325 MG EC tablet Take 325 mg by mouth daily.     Yes Historical Provider, MD  b complex vitamins tablet Take 1 tablet by mouth daily.   Yes Historical Provider, MD  cholecalciferol (VITAMIN D) 1000 UNITS tablet Take 1,000 Units by mouth daily.     Yes Historical Provider, MD  co-enzyme Q-10 30 MG capsule Take 30 mg by mouth 3 (three) times daily.   Yes Historical Provider, MD  donepezil (ARICEPT) 10 MG tablet Take 10 mg by mouth daily.   Yes Historical Provider, MD  Fluticasone-Salmeterol (ADVAIR) 250-50 MCG/DOSE AEPB Inhale 1 puff into the lungs 2 (two) times daily.   Yes Historical Provider, MD  ibuprofen (ADVIL,MOTRIN) 200 MG tablet Take 400 mg by mouth every 8 (eight) hours as needed. For pain.   Yes Historical Provider, MD  leflunomide (ARAVA) 20 MG tablet Take 20 mg by mouth daily.   Yes Historical Provider, MD  nitroGLYCERIN (NITROSTAT) 0.4 MG SL tablet Place 0.4 mg under the tongue every 5 (five) minutes x 3 doses as needed. For chest pain.   Yes Historical Provider, MD  pravastatin (PRAVACHOL) 40 MG tablet take 1 tablet by mouth at bedtime   Yes Wendall Stade, MD  traZODone (DESYREL) 50 MG tablet Take 50 mg by mouth at bedtime as needed for sleep.    Yes Historical Provider, MD  famotidine (PEPCID) 20 MG tablet Take 20 mg by mouth as needed for heartburn.     Historical Provider, MD  Fluticasone-Salmeterol (ADVAIR DISKUS) 250-50 MCG/DOSE AEPB Inhale 1 puff into the lungs 2 (two)  times daily. 03/25/13 03/25/14  Storm Frisk, MD   Physical Exam: Filed Vitals:   04/12/14 1130 04/12/14 1152 04/12/14 1241 04/12/14 1257  BP: 127/61  126/69 112/66  Pulse: 85 84 80 81  Temp:    98.2 F (36.8 C)  TempSrc:    Oral  Resp: 18 18 18 18   SpO2: 90% 90% 92% 91%     General:  Awake, in nad  Eyes: PERRL B  ENT: membranes moist, dentition fair  Neck: trachea midline, neck supple  Cardiovascular: regular, s1, s2  Respiratory: normal resp effort, no wheezing  Abdomen: soft, nondistended  Skin: normal skin turgor, no abnormal skin lesions seen  Musculoskeletal: perfused, no clubbing  Psychiatric: confused, mood/affect normal  Neurologic: cn2-12 grossly intact, strength/sensation intact  Labs on Admission:  Basic Metabolic Panel:  Recent Labs Lab 04/12/14 1048  NA 136*  K 3.9  CL 100  CO2 24  GLUCOSE 119*  BUN 10  CREATININE 0.93  CALCIUM 8.8   Liver Function Tests: No results found for this basename: AST, ALT, ALKPHOS, BILITOT, PROT, ALBUMIN,  in the last 168 hours No results found for this basename: LIPASE, AMYLASE,  in the last 168 hours No results found for this basename: AMMONIA,  in the last 168 hours CBC:  Recent Labs Lab 04/12/14 1048  WBC 16.5*  NEUTROABS 12.8*  HGB 12.6*  HCT 39.3  MCV 86.0  PLT 172   Cardiac Enzymes: No results found for this basename: CKTOTAL, CKMB, CKMBINDEX, TROPONINI,  in the last 168 hours  BNP (last 3 results)  Recent Labs  04/12/14 1048  PROBNP 616.1*   CBG: No results found for this basename: GLUCAP,  in the last 168 hours  Radiological Exams on Admission: Dg Chest 2 View  04/12/2014   CLINICAL DATA:  Short of breath, cough  EXAM: CHEST  2 VIEW  COMPARISON:  Radiograph 06/23/2011  FINDINGS: Normal cardiac silhouette. There is interval increase in right lower lobe airspace disease and right middle lobe airspace disease. Lungs are hyperinflated. There is some volume loss in the right hemi  thorax. No pleural fluid. No pneumothorax.  IMPRESSION: Worsening right lower lobe and right middle lobe pneumonia. Recommend followup radiographs to ensure resolution and if abnormalities persist, consider CT thorax in patient with smoking history.   Electronically Signed   By: 08/21/2011 M.D.   On: 04/12/2014 11:43    Assessment/Plan Active Problems:   Essential hypertension   Coronary atherosclerosis   Obstructive chronic bronchitis without exacerbation Gold B   Alzheimer's dementia   CAP (community acquired pneumonia)   Pneumonia  1. CAP 1. R sided PNA seen on CXR 2. No recent hospitalizations over the past 3 months 3. Pt is PCN  allergic 4. Will continue with levaquin 5. Pan cultures pending 6. Admit to med-tele 2. COPD exacerbation 1. Pt's wife reports COPD has been much worse over the past week 2. Currently not wheezing, but pt is s/p albuterol neb en route to ED 3. Will cont on scheduled duoneb tx and IV steroids with plans to wean eventually 4. Pt is followed as an outpatient by Dr. Delford Field 3. HTN 1. Stable 2. Controlled. Cont home meds. 4. Dementia 1. Stable 2. Cont home meds 5. DVT prophylaxis 1. Heparin subQ  Code Status: DNR - confirmed with pt's wife at bedside Family Communication: Pt's wife at bedside Disposition Plan: Tele floor   Time spent:  CHIU, Scheryl Marten [facility] Pager [phone]  If 7PM-7AM, please contact night-coverage [email or web address] Password [email or web address] 04/12/2014, 1:28 PM

## 2014-04-13 DIAGNOSIS — J449 Chronic obstructive pulmonary disease, unspecified: Secondary | ICD-10-CM

## 2014-04-13 LAB — COMPREHENSIVE METABOLIC PANEL
ALBUMIN: 3.1 g/dL — AB (ref 3.5–5.2)
ALK PHOS: 95 U/L (ref 39–117)
ALT: 14 U/L (ref 0–53)
AST: 23 U/L (ref 0–37)
Anion gap: 14 (ref 5–15)
BUN: 22 mg/dL (ref 6–23)
CO2: 25 mEq/L (ref 19–32)
Calcium: 9.2 mg/dL (ref 8.4–10.5)
Chloride: 101 mEq/L (ref 96–112)
Creatinine, Ser: 1.06 mg/dL (ref 0.50–1.35)
GFR calc Af Amer: 74 mL/min — ABNORMAL LOW (ref >90)
GFR calc non Af Amer: 64 mL/min — ABNORMAL LOW (ref >90)
Glucose, Bld: 165 mg/dL — ABNORMAL HIGH (ref 70–99)
POTASSIUM: 4.8 meq/L (ref 3.7–5.3)
Sodium: 140 mEq/L (ref 137–147)
Total Bilirubin: 0.4 mg/dL (ref 0.3–1.2)
Total Protein: 7.7 g/dL (ref 6.0–8.3)

## 2014-04-13 LAB — CBC
HCT: 41.9 % (ref 39.0–52.0)
Hemoglobin: 13.6 g/dL (ref 13.0–17.0)
MCH: 27.9 pg (ref 26.0–34.0)
MCHC: 32.5 g/dL (ref 30.0–36.0)
MCV: 85.9 fL (ref 78.0–100.0)
PLATELETS: 223 10*3/uL (ref 150–400)
RBC: 4.88 MIL/uL (ref 4.22–5.81)
RDW: 15.1 % (ref 11.5–15.5)
WBC: 25.8 10*3/uL — ABNORMAL HIGH (ref 4.0–10.5)

## 2014-04-13 MED ORDER — LORAZEPAM 2 MG/ML IJ SOLN: 1.0000 mg | Freq: Once | INTRAMUSCULAR | Status: DC

## 2014-04-13 MED ORDER — LORAZEPAM 2 MG/ML IJ SOLN
INTRAMUSCULAR | Status: AC
Start: 2014-04-13 — End: 2014-04-13
  Administered 2014-04-13: 1 mg
  Filled 2014-04-13: qty 1

## 2014-04-13 MED ORDER — LORAZEPAM 2 MG/ML IJ SOLN: 1.0000 mg | Freq: Once | INTRAMUSCULAR | Status: AC

## 2014-04-13 MED ORDER — LORAZEPAM 1 MG PO TABS
1.0000 mg | ORAL_TABLET | Freq: Once | ORAL | Status: AC
  Administered 2014-04-13: 1 mg via ORAL
  Filled 2014-04-13: qty 1

## 2014-04-13 MED ORDER — PREDNISONE 20 MG PO TABS
40.0000 mg | ORAL_TABLET | Freq: Every day | ORAL | Status: DC
  Administered 2014-04-13 – 2014-04-15 (×3): 40 mg via ORAL
  Filled 2014-04-13 (×4): qty 2

## 2014-04-13 MED ORDER — LEVOFLOXACIN 750 MG PO TABS
750.0000 mg | ORAL_TABLET | Freq: Every day | ORAL | Status: DC
  Administered 2014-04-13 – 2014-04-15 (×3): 750 mg via ORAL
  Filled 2014-04-13 (×3): qty 1

## 2014-04-13 NOTE — Progress Notes (Addendum)
Pt disoriented and combative. Pt pulling off oxygen, telemetry, and refusing medications and assessment.  Pt Wife Smitty Pluck called to assist in care. pt allowed writer to obtain oxygen saturation while wife was on phone, but still refusing telemetry, medications and assessment. Wife stated " I'll be there in a little while and he'll take his medication then." Writer will attempt to give medication and complete an assessment once wife arrives. Dr. Rhona Leavens text paged. New orders placed in Epic, and followed out. Will continue to monitor.

## 2014-04-13 NOTE — Progress Notes (Signed)
Patient woke up very confused, agitated, combative. Patient removed O2 cannula ,tele, refusing all kinds of care,got out of bed and walked in hallway naked. On call MD  Dr Hyman Hopes made aware of patients behavior. New order received to giveativan 1 MG X1  For agitation. Order carried out . Will cont to monitor pt closely.

## 2014-04-13 NOTE — Progress Notes (Signed)
Brief Pharmacy note: Levaquin to po dosing in 82 yoM.  Renal function will support 750mg  po daily in this patient, order changed to po with today's dose  Thank you,  PharmD Pager [phone] 04/13/2014, 11:54 AM

## 2014-04-13 NOTE — Progress Notes (Signed)
[facility] PROGRESS NOTE  [name] [name] TDV:[id number] DOB: 1931-08-16 DOA: 04/12/2014 PCP: Hoyle Sauer, MD  Assessment/Plan: 1. CAP 1. R sided PNA seen on CXR 2. No recent hospitalizations over the past 3 months 3. Pt is PCN allergic 4. Tolerating levaquin 5. Pan cultures pending 6. Clinically improving, but leukocytosis worse to 25k - but patient is on high dose IV steroids for concerns of copd exacerbation 2. COPD exacerbation 1. Pt's wife reports COPD has been much worse over the past week 2. Not wheezing 3. Will transition to PO prednisone  4. Pt is followed as an outpatient by Dr. Delford Field 3. HTN 1. Stable 2. Controlled. Cont home meds. 4. Dementia 1. More confused overnight 2. Suspect mental status worse with steroids and being in hospital 3. Cont home meds 5. DVT prophylaxis 1. Heparin subQ  Code Status: DNR Family Communication: Wife at bedside (indicate person spoken with, relationship, and if by phone, the number) Disposition Plan: Pending   Consultants:    Procedures:    Antibiotics:  Levaquin 10/31>>> (indicate start date, and stop date if known)  HPI/Subjective: Confused overnight  Objective: Filed Vitals:   04/13/14 0824 04/13/14 0942 04/13/14 1411 04/13/14 1616  BP:    94/67  Pulse:    74  Temp:    98.1 F (36.7 C)  TempSrc:    Oral  Resp:    20  Height:      Weight:      SpO2: 96% 92% 95% 100%    Intake/Output Summary (Last 24 hours) at 04/13/14 1741 Last data filed at 04/13/14 1300  Gross per 24 hour  Intake    640 ml  Output      0 ml  Net    640 ml   Filed Weights   04/12/14 1401  Weight: 76.9 kg (169 lb 8.5 oz)    Exam:   General:  Awake, in nad  Cardiovascular: regular, s1, s2  Respiratory: normal resp effort, no wheezing  Abdomen: soft, nondistended  Musculoskeletal: perfused, no clubbing   Data Reviewed: Basic Metabolic Panel:  Recent Labs Lab 04/12/14 1048 04/13/14 1200  NA 136* 140   K 3.9 4.8  CL 100 101  CO2 24 25  GLUCOSE 119* 165*  BUN 10 22  CREATININE 0.93 1.06  CALCIUM 8.8 9.2   Liver Function Tests:  Recent Labs Lab 04/13/14 1200  AST 23  ALT 14  ALKPHOS 95  BILITOT 0.4  PROT 7.7  ALBUMIN 3.1*   No results for input(s): LIPASE, AMYLASE in the last 168 hours. No results for input(s): AMMONIA in the last 168 hours. CBC:  Recent Labs Lab 04/12/14 1048 04/13/14 1200  WBC 16.5* 25.8*  NEUTROABS 12.8*  --   HGB 12.6* 13.6  HCT 39.3 41.9  MCV 86.0 85.9  PLT 172 223   Cardiac Enzymes: No results for input(s): CKTOTAL, CKMB, CKMBINDEX, TROPONINI in the last 168 hours. BNP (last 3 results)  Recent Labs  04/12/14 1048  PROBNP 616.1*   CBG: No results for input(s): GLUCAP in the last 168 hours.  Recent Results (from the past 240 hour(s))  Culture, blood (routine x 2) Call MD if unable to obtain prior to antibiotics being given     Status: None (Preliminary result)   Collection Time: 04/12/14  2:10 PM  Result Value Ref Range Status   Specimen Description BLOOD LEFT ARM  Final   Special Requests   Final    BOTTLES DRAWN AEROBIC AND ANAEROBIC 10CC  BOTH BOTTLES   Culture  Setup Time   Final    04/12/2014 20:10 Performed at [facility]    Culture   Final           BLOOD CULTURE RECEIVED NO GROWTH TO DATE CULTURE WILL BE HELD FOR 5 DAYS BEFORE ISSUING A FINAL NEGATIVE REPORT Performed at [facility]    Report Status PENDING  Incomplete  Culture, blood (routine x 2) Call MD if unable to obtain prior to antibiotics being given     Status: None (Preliminary result)   Collection Time: 04/12/14  2:16 PM  Result Value Ref Range Status   Specimen Description BLOOD RIGHT ARM  Final   Special Requests   Final    BOTTLES DRAWN AEROBIC AND ANAEROBIC 10CC BOTH BOTTLES   Culture  Setup Time   Final    04/12/2014 20:10 Performed at [facility]    Culture   Final           BLOOD CULTURE RECEIVED NO GROWTH TO DATE  CULTURE WILL BE HELD FOR 5 DAYS BEFORE ISSUING A FINAL NEGATIVE REPORT Performed at [facility]    Report Status PENDING  Incomplete  Culture, sputum-assessment     Status: None   Collection Time: 04/12/14  4:36 PM  Result Value Ref Range Status   Specimen Description SPUTUM  Final   Special Requests NONE  Final   Sputum evaluation   Final    THIS SPECIMEN IS ACCEPTABLE. RESPIRATORY CULTURE REPORT TO FOLLOW.   Report Status 04/12/2014 FINAL  Final     Studies: Dg Chest 2 View  04/12/2014   CLINICAL DATA:  Short of breath, cough  EXAM: CHEST  2 VIEW  COMPARISON:  Radiograph 06/23/2011  FINDINGS: Normal cardiac silhouette. There is interval increase in right lower lobe airspace disease and right middle lobe airspace disease. Lungs are hyperinflated. There is some volume loss in the right hemi thorax. No pleural fluid. No pneumothorax.  IMPRESSION: Worsening right lower lobe and right middle lobe pneumonia. Recommend followup radiographs to ensure resolution and if abnormalities persist, consider CT thorax in patient with smoking history.   Electronically Signed   By: Genevive Bi M.D.   On: 04/12/2014 11:43    Scheduled Meds: . aspirin  325 mg Oral Daily  . cholecalciferol  1,000 Units Oral Daily  . donepezil  10 mg Oral Daily  . heparin  5,000 Units Subcutaneous 3 times per day  . ipratropium-albuterol  3 mL Nebulization TID  . leflunomide  20 mg Oral Daily  . levofloxacin  750 mg Oral Daily  . LORazepam  1 mg Intravenous Once  . pravastatin  40 mg Oral Daily  . predniSONE  40 mg Oral Q breakfast  . sodium chloride  3 mL Intravenous Q12H   Continuous Infusions: . sodium chloride 20 mL/hr (04/12/14 1050)    Active Problems:   Essential hypertension   Coronary atherosclerosis   Obstructive chronic bronchitis without exacerbation Gold B   Alzheimer's dementia   CAP (community acquired pneumonia)   Pneumonia    Time spent:    [name] K  [facility] Pager [phone]. If 7PM-7AM, please contact night-coverage at [email or web address], password [facility] 04/13/2014, 5:41 PM  LOS: 1 day

## 2014-04-14 ENCOUNTER — Inpatient Hospital Stay (HOSPITAL_COMMUNITY): Payer: Medicare Other

## 2014-04-14 LAB — CBC WITH DIFFERENTIAL/PLATELET
BASOS PCT: 0 % (ref 0–1)
Basophils Absolute: 0 10*3/uL (ref 0.0–0.1)
Eosinophils Absolute: 0 10*3/uL (ref 0.0–0.7)
Eosinophils Relative: 0 % (ref 0–5)
HEMATOCRIT: 37 % — AB (ref 39.0–52.0)
HEMOGLOBIN: 12 g/dL — AB (ref 13.0–17.0)
Lymphocytes Relative: 7 % — ABNORMAL LOW (ref 12–46)
Lymphs Abs: 1.8 10*3/uL (ref 0.7–4.0)
MCH: 28 pg (ref 26.0–34.0)
MCHC: 32.4 g/dL (ref 30.0–36.0)
MCV: 86.2 fL (ref 78.0–100.0)
Monocytes Absolute: 1 10*3/uL (ref 0.1–1.0)
Monocytes Relative: 4 % (ref 3–12)
NEUTROS ABS: 22.9 10*3/uL — AB (ref 1.7–7.7)
Neutrophils Relative %: 89 % — ABNORMAL HIGH (ref 43–77)
Platelets: 194 10*3/uL (ref 150–400)
RBC: 4.29 MIL/uL (ref 4.22–5.81)
RDW: 15.3 % (ref 11.5–15.5)
WBC: 25.7 10*3/uL — ABNORMAL HIGH (ref 4.0–10.5)

## 2014-04-14 LAB — STREP PNEUMONIAE URINARY ANTIGEN: Strep Pneumo Urinary Antigen: NEGATIVE

## 2014-04-14 NOTE — Progress Notes (Signed)
[facility] PROGRESS NOTE  [name] [name] [id number]:[id number] DOB: [birth year] DOA: 04/12/2014 PCP: Hoyle Sauer, MD  Assessment/Plan: 1. CAP 1. R sided PNA seen on CXR 2. No recent hospitalizations over the past 3 months 3. Pt is PCN allergic 4. Cont to tolerate levaquin 5. Pan cultures pending 6. Clinically improving, but leukocytosis remains at 25k - high dose steroids were stopped on 11/1 7. Continue to follow leukocytosis for improvement 2. COPD exacerbation 1. Pt's wife reports COPD has been much worse over the past week prior to admit 2. Transitioned to PO prednisone on 11/1 3. Pt is followed as an outpatient by Dr. Delford Field 4. Wheezing on exam today, but pt maintaining O2 sats 3. HTN 1. Stable 2. Controlled. Cont home meds. 4. Dementia 1. Cont to be confused 2. Suspect mental status worse with steroids and being in hospital 3. Cont home meds 5. DVT prophylaxis 1. Heparin subQ  Code Status: DNR Family Communication: Pt in room Disposition Plan: Pending   Consultants:    Procedures:    Antibiotics:  Levaquin 10/31>>>  HPI/Subjective: Remains confused today. Reportedly confused, walking down the hall naked last night  Objective: Filed Vitals:   04/13/14 2252 04/14/14 0905 04/14/14 1300 04/14/14 1442  BP: 142/65  125/61   Pulse: 82  76   Temp: 97.5 F (36.4 C)  99 F (37.2 C)   TempSrc: Oral  Oral   Resp: 20  18   Height:      Weight:      SpO2: 100% 93% 94% 94%    Intake/Output Summary (Last 24 hours) at 04/14/14 1830 Last data filed at 04/14/14 1700  Gross per 24 hour  Intake    720 ml  Output      0 ml  Net    720 ml   Filed Weights   04/12/14 1401  Weight: 76.9 kg (169 lb 8.5 oz)    Exam:   General:  Awake, in nad  Cardiovascular: regular, s1, s2  Respiratory: normal resp effort, no wheezing  Abdomen: soft, nondistended  Musculoskeletal: perfused, no clubbing   Data Reviewed: Basic Metabolic Panel:  Recent  Labs Lab 04/12/14 1048 04/13/14 1200  NA 136* 140  K 3.9 4.8  CL 100 101  CO2 24 25  GLUCOSE 119* 165*  BUN 10 22  CREATININE 0.93 1.06  CALCIUM 8.8 9.2   Liver Function Tests:  Recent Labs Lab 04/13/14 1200  AST 23  ALT 14  ALKPHOS 95  BILITOT 0.4  PROT 7.7  ALBUMIN 3.1*   No results for input(s): LIPASE, AMYLASE in the last 168 hours. No results for input(s): AMMONIA in the last 168 hours. CBC:  Recent Labs Lab 04/12/14 1048 04/13/14 1200 04/14/14 0420  WBC 16.5* 25.8* 25.7*  NEUTROABS 12.8*  --  22.9*  HGB 12.6* 13.6 12.0*  HCT 39.3 41.9 37.0*  MCV 86.0 85.9 86.2  PLT 172 223 194   Cardiac Enzymes: No results for input(s): CKTOTAL, CKMB, CKMBINDEX, TROPONINI in the last 168 hours. BNP (last 3 results)  Recent Labs  04/12/14 1048  PROBNP 616.1*   CBG: No results for input(s): GLUCAP in the last 168 hours.  Recent Results (from the past 240 hour(s))  Culture, blood (routine x 2) Call MD if unable to obtain prior to antibiotics being given     Status: None (Preliminary result)   Collection Time: 04/12/14  2:10 PM  Result Value Ref Range Status   Specimen Description BLOOD LEFT ARM  Final  Special Requests   Final    BOTTLES DRAWN AEROBIC AND ANAEROBIC 10CC BOTH BOTTLES   Culture  Setup Time   Final    04/12/2014 20:10 Performed at [facility]    Culture   Final           BLOOD CULTURE RECEIVED NO GROWTH TO DATE CULTURE WILL BE HELD FOR 5 DAYS BEFORE ISSUING A FINAL NEGATIVE REPORT Performed at [facility]    Report Status PENDING  Incomplete  Culture, blood (routine x 2) Call MD if unable to obtain prior to antibiotics being given     Status: None (Preliminary result)   Collection Time: 04/12/14  2:16 PM  Result Value Ref Range Status   Specimen Description BLOOD RIGHT ARM  Final   Special Requests   Final    BOTTLES DRAWN AEROBIC AND ANAEROBIC 10CC BOTH BOTTLES   Culture  Setup Time   Final    04/12/2014 20:10 Performed  at [facility]    Culture   Final           BLOOD CULTURE RECEIVED NO GROWTH TO DATE CULTURE WILL BE HELD FOR 5 DAYS BEFORE ISSUING A FINAL NEGATIVE REPORT Performed at [facility]    Report Status PENDING  Incomplete  Culture, sputum-assessment     Status: None   Collection Time: 04/12/14  4:36 PM  Result Value Ref Range Status   Specimen Description SPUTUM  Final   Special Requests NONE  Final   Sputum evaluation   Final    THIS SPECIMEN IS ACCEPTABLE. RESPIRATORY CULTURE REPORT TO FOLLOW.   Report Status 04/12/2014 FINAL  Final  Culture, respiratory (NON-Expectorated)     Status: None (Preliminary result)   Collection Time: 04/12/14  4:36 PM  Result Value Ref Range Status   Specimen Description SPUTUM  Final   Special Requests NONE  Final   Gram Stain   Final    NO WBC SEEN NO SQUAMOUS EPITHELIAL CELLS SEEN RARE GRAM POSITIVE COCCI IN PAIRS RARE GRAM NEGATIVE RODS Performed at [facility]    Culture   Final    NORMAL OROPHARYNGEAL FLORA Performed at [facility]    Report Status PENDING  Incomplete     Studies: Dg Chest Port 1 View  04/14/2014   CLINICAL DATA:  Community acquired pneumonia. COPD, shortness of breath, and wheezing. Altered mental status.  EXAM: PORTABLE CHEST - 1 VIEW  COMPARISON:  04/12/2014  FINDINGS: The cardiomediastinal silhouette is within normal limits. There is again evidence of mild volume loss in the right hemithorax. Patchy and linear densities in the right mid and lower lung do not appear significantly changed. There is diffusely increased interstitial thickening throughout both lungs compared to the prior study. No new confluent opacity is present. There is chronic blunting of the right greater than left costophrenic angles. A small right pleural effusion is not completely excluded. No pneumothorax is identified.  IMPRESSION: 1. Unchanged right middle and right lower lobe opacities, which may reflect pneumonia  superimposed on chronic scarring. 2. Interval increase in diffuse interstitial thickening, query viral or atypical pneumonitis.   Electronically Signed   By: Sebastian Ache   On: 04/14/2014 10:12    Scheduled Meds: . aspirin  325 mg Oral Daily  . cholecalciferol  1,000 Units Oral Daily  . donepezil  10 mg Oral Daily  . heparin  5,000 Units Subcutaneous 3 times per day  . ipratropium-albuterol  3 mL Nebulization TID  .  leflunomide  20 mg Oral Daily  . levofloxacin  750 mg Oral Daily  . LORazepam  1 mg Intravenous Once  . pravastatin  40 mg Oral Daily  . predniSONE  40 mg Oral Q breakfast  . sodium chloride  3 mL Intravenous Q12H   Continuous Infusions: . sodium chloride 20 mL/hr (04/12/14 1050)    Active Problems:   Essential hypertension   Coronary atherosclerosis   Obstructive chronic bronchitis without exacerbation Gold B   Alzheimer's dementia   CAP (community acquired pneumonia)   Pneumonia    Time spent:    CHIU, STEPHEN K  [facility] Pager [phone]. If 7PM-7AM, please contact night-coverage at [email or web address], password [facility] 04/14/2014, 6:30 PM  LOS: 2 days

## 2014-04-14 NOTE — Clinical Documentation Improvement (Signed)
  Nursing documented that the patient was disoriented, combative, pulling off telemetry leads and 02, getting out of bed, etc.  Ativan ordered and administered.  Possible Clinical Conditions:   - Acute Encephalopathy (including type, if known) 2/2 steroids, acute illness and change in environment   - Dementia with behavioral disturbance   - Other Condition   - Unable to Clinically Determine   Thank You, Jerral Ralph ,RN Clinical Documentation Specialist:  [phone] [facility]- Health Information Management

## 2014-04-15 ENCOUNTER — Telehealth: Payer: Self-pay | Admitting: Critical Care Medicine

## 2014-04-15 LAB — CBC
HEMATOCRIT: 39.5 % (ref 39.0–52.0)
Hemoglobin: 12.7 g/dL — ABNORMAL LOW (ref 13.0–17.0)
MCH: 27.9 pg (ref 26.0–34.0)
MCHC: 32.2 g/dL (ref 30.0–36.0)
MCV: 86.6 fL (ref 78.0–100.0)
Platelets: 188 10*3/uL (ref 150–400)
RBC: 4.56 MIL/uL (ref 4.22–5.81)
RDW: 15.2 % (ref 11.5–15.5)
WBC: 13.8 10*3/uL — AB (ref 4.0–10.5)

## 2014-04-15 LAB — BASIC METABOLIC PANEL
Anion gap: 10 (ref 5–15)
BUN: 24 mg/dL — ABNORMAL HIGH (ref 6–23)
CALCIUM: 9.3 mg/dL (ref 8.4–10.5)
CO2: 29 mEq/L (ref 19–32)
Chloride: 101 mEq/L (ref 96–112)
Creatinine, Ser: 1.07 mg/dL (ref 0.50–1.35)
GFR calc Af Amer: 74 mL/min — ABNORMAL LOW (ref >90)
GFR calc non Af Amer: 64 mL/min — ABNORMAL LOW (ref >90)
GLUCOSE: 95 mg/dL (ref 70–99)
Potassium: 4.4 mEq/L (ref 3.7–5.3)
SODIUM: 140 meq/L (ref 137–147)

## 2014-04-15 LAB — LEGIONELLA ANTIGEN, URINE

## 2014-04-15 LAB — CULTURE, RESPIRATORY
CULTURE: NORMAL
GRAM STAIN: NONE SEEN

## 2014-04-15 LAB — CULTURE, RESPIRATORY W GRAM STAIN

## 2014-04-15 MED ORDER — PREDNISONE 20 MG PO TABS: 40.0000 mg | ORAL_TABLET | Freq: Every day | ORAL | Status: DC

## 2014-04-15 MED ORDER — FLUTICASONE-SALMETEROL 250-50 MCG/DOSE IN AEPB: 1.0000 | INHALATION_SPRAY | Freq: Two times a day (BID) | RESPIRATORY_TRACT | Status: DC

## 2014-04-15 MED ORDER — LEVOFLOXACIN 750 MG PO TABS: 750.0000 mg | ORAL_TABLET | Freq: Every day | ORAL | Status: DC

## 2014-04-15 NOTE — Discharge Summary (Signed)
Physician Discharge Summary  Avis Mcmahill Heffernan QAE:[id number] DOB: 1931/10/13 DOA: 04/12/2014  PCP: Hoyle Sauer, MD  Admit date: 04/12/2014 Discharge date: 04/15/2014  Time spent: 35 minutes  Recommendations for Outpatient Follow-up:  1. Follow up with PCP in 1-2 weeks 2. Follow up with Dr. Delford Field as needed  Discharge Diagnoses:  Active Problems:   Essential hypertension   Coronary atherosclerosis   Obstructive chronic bronchitis without exacerbation Gold B   Alzheimer's dementia   CAP (community acquired pneumonia)   Pneumonia  Discharge Condition: Improved  Diet recommendation: Regular  Filed Weights   04/12/14 1401  Weight: 76.9 kg (169 lb 8.5 oz)    History of present illness:  Please see admit h and p from 10/31 for details. Briefly, pt presents with SOB, found to be wheezing and with evidence of R sided PNA on cxr. Pt was admitted for further management.  Hospital Course:  1. CAP 1. R sided PNA seen on CXR 2. No recent hospitalizations over the past 3 months 3. Pt is PCN allergic 4. Patient was cont on levaquin with gradual improvement 5. Pan cultures neg 6. Clinically improving, but leukocytosis peaked at 25k - high dose steroids were stopped on 11/1 with much improvement in WBC after stopping high dosed steroids 2. COPD exacerbation 1. Pt's wife reports COPD has been much worse over the past week prior to admit 2. Transitioned to PO prednisone on 11/1 3. Pt is followed as an outpatient by Dr. Delford Field 4. Pt is maintained on min O2 support. Recommend follow up with Pulmonary as an outpatient 3. HTN 1. Stable 2. Controlled. Cont home meds. 4. Dementia 1. Cont to be confused, worse at night 2. Suspect mental status worse with steroids and delirium being in hospital 3. Cont home meds 5. DVT prophylaxis 1. Heparin subQ  Consultations:  none  Discharge Exam: Filed Vitals:   04/14/14 1442 04/14/14 2033 04/15/14 0619 04/15/14 0911  BP:  150/84 122/68    Pulse:  58 61   Temp:  97.5 F (36.4 C) 98.5 F (36.9 C)   TempSrc:  Oral Oral   Resp:  20 20   Height:      Weight:      SpO2: 94% 97% 98% 91%    General: Awake, in nad Cardiovascular: regular,s 1, s2 Respiratory: normal resp effort, no wheezing  Discharge Instructions     Medication List    TAKE these medications        acetaminophen 500 MG tablet  Commonly known as:  TYLENOL  Take 1,000 mg by mouth daily as needed for mild pain or headache.     albuterol 108 (90 BASE) MCG/ACT inhaler  Commonly known as:  PROAIR HFA  Inhale 2 puffs into the lungs every 4 (four) hours as needed for wheezing.     aspirin 325 MG EC tablet  Take 325 mg by mouth daily.     b complex vitamins tablet  Take 1 tablet by mouth daily.     cholecalciferol 1000 UNITS tablet  Commonly known as:  VITAMIN D  Take 1,000 Units by mouth daily.     co-enzyme Q-10 30 MG capsule  Take 30 mg by mouth 3 (three) times daily.     donepezil 10 MG tablet  Commonly known as:  ARICEPT  Take 10 mg by mouth daily.     Fluticasone-Salmeterol 250-50 MCG/DOSE Aepb  Commonly known as:  ADVAIR DISKUS  Inhale 1 puff into the lungs 2 (two) times daily.  Fluticasone-Salmeterol 250-50 MCG/DOSE Aepb  Commonly known as:  ADVAIR  Inhale 1 puff into the lungs 2 (two) times daily.     ibuprofen 200 MG tablet  Commonly known as:  ADVIL,MOTRIN  Take 400 mg by mouth every 8 (eight) hours as needed. For pain.     leflunomide 20 MG tablet  Commonly known as:  ARAVA  Take 20 mg by mouth daily.     levofloxacin 750 MG tablet  Commonly known as:  LEVAQUIN  Take 1 tablet (750 mg total) by mouth daily.     nitroGLYCERIN 0.4 MG SL tablet  Commonly known as:  NITROSTAT  Place 0.4 mg under the tongue every 5 (five) minutes x 3 doses as needed. For chest pain.     PEPCID 20 MG tablet  Generic drug:  famotidine  Take 20 mg by mouth as needed for heartburn.     pravastatin 40 MG tablet  Commonly known as:   PRAVACHOL  take 1 tablet by mouth at bedtime     predniSONE 20 MG tablet  Commonly known as:  DELTASONE  Take 2 tablets (40 mg total) by mouth daily with breakfast.     traZODone 50 MG tablet  Commonly known as:  DESYREL  Take 50 mg by mouth at bedtime as needed for sleep.       Allergies  Allergen Reactions  . Iohexol      Desc: Increased difficulty breathing, Onset Date: [id number]   . Penicillins   . Zolpidem Tartrate     REACTION: Acute delirium   Follow-up Information    Follow up with Hoyle Sauer, MD. Schedule an appointment as soon as possible for a visit in 1 week.   Specialty:  Internal Medicine   Contact information:   [address] [place] [address] [phone]       Follow up with Shan Levans, MD.   Specialty:  Pulmonary Disease   Contact information:   [address] [place] [address] [phone]        The results of significant diagnostics from this hospitalization (including imaging, microbiology, ancillary and laboratory) are listed below for reference.    Significant Diagnostic Studies: Dg Chest 2 View  04/12/2014   CLINICAL DATA:  Short of breath, cough  EXAM: CHEST  2 VIEW  COMPARISON:  Radiograph 06/23/2011  FINDINGS: Normal cardiac silhouette. There is interval increase in right lower lobe airspace disease and right middle lobe airspace disease. Lungs are hyperinflated. There is some volume loss in the right hemi thorax. No pleural fluid. No pneumothorax.  IMPRESSION: Worsening right lower lobe and right middle lobe pneumonia. Recommend followup radiographs to ensure resolution and if abnormalities persist, consider CT thorax in patient with smoking history.   Electronically Signed   By: Genevive Bi M.D.   On: 04/12/2014 11:43   Dg Chest Port 1 View  04/14/2014   CLINICAL DATA:  Community acquired pneumonia. COPD, shortness of breath, and wheezing. Altered mental status.  EXAM: PORTABLE CHEST - 1 VIEW  COMPARISON:   04/12/2014  FINDINGS: The cardiomediastinal silhouette is within normal limits. There is again evidence of mild volume loss in the right hemithorax. Patchy and linear densities in the right mid and lower lung do not appear significantly changed. There is diffusely increased interstitial thickening throughout both lungs compared to the prior study. No new confluent opacity is present. There is chronic blunting of the right greater than left costophrenic angles. A small right pleural effusion is not  completely excluded. No pneumothorax is identified.  IMPRESSION: 1. Unchanged right middle and right lower lobe opacities, which may reflect pneumonia superimposed on chronic scarring. 2. Interval increase in diffuse interstitial thickening, query viral or atypical pneumonitis.   Electronically Signed   By: Sebastian Ache   On: 04/14/2014 10:12    Microbiology: Recent Results (from the past 240 hour(s))  Culture, blood (routine x 2) Call MD if unable to obtain prior to antibiotics being given     Status: None (Preliminary result)   Collection Time: 04/12/14  2:10 PM  Result Value Ref Range Status   Specimen Description BLOOD LEFT ARM  Final   Special Requests   Final    BOTTLES DRAWN AEROBIC AND ANAEROBIC 10CC BOTH BOTTLES   Culture  Setup Time   Final    04/12/2014 20:10 Performed at [facility]    Culture   Final           BLOOD CULTURE RECEIVED NO GROWTH TO DATE CULTURE WILL BE HELD FOR 5 DAYS BEFORE ISSUING A FINAL NEGATIVE REPORT Performed at [facility]    Report Status PENDING  Incomplete  Culture, blood (routine x 2) Call MD if unable to obtain prior to antibiotics being given     Status: None (Preliminary result)   Collection Time: 04/12/14  2:16 PM  Result Value Ref Range Status   Specimen Description BLOOD RIGHT ARM  Final   Special Requests   Final    BOTTLES DRAWN AEROBIC AND ANAEROBIC 10CC BOTH BOTTLES   Culture  Setup Time   Final    04/12/2014 20:10 Performed at  [facility]    Culture   Final           BLOOD CULTURE RECEIVED NO GROWTH TO DATE CULTURE WILL BE HELD FOR 5 DAYS BEFORE ISSUING A FINAL NEGATIVE REPORT Note: Culture results may be compromised due to an excessive volume of blood received in culture bottles. Performed at [facility]    Report Status PENDING  Incomplete  Culture, sputum-assessment     Status: None   Collection Time: 04/12/14  4:36 PM  Result Value Ref Range Status   Specimen Description SPUTUM  Final   Special Requests NONE  Final   Sputum evaluation   Final    THIS SPECIMEN IS ACCEPTABLE. RESPIRATORY CULTURE REPORT TO FOLLOW.   Report Status 04/12/2014 FINAL  Final  Culture, respiratory (NON-Expectorated)     Status: None   Collection Time: 04/12/14  4:36 PM  Result Value Ref Range Status   Specimen Description SPUTUM  Final   Special Requests NONE  Final   Gram Stain   Final    NO WBC SEEN NO SQUAMOUS EPITHELIAL CELLS SEEN RARE GRAM POSITIVE COCCI IN PAIRS RARE GRAM NEGATIVE RODS Performed at [facility]    Culture   Final    NORMAL OROPHARYNGEAL FLORA Performed at [facility]    Report Status 04/15/2014 FINAL  Final     Labs: Basic Metabolic Panel:  Recent Labs Lab 04/12/14 1048 04/13/14 1200 04/15/14 0440  NA 136* 140 140  K 3.9 4.8 4.4  CL 100 101 101  CO2 24 25 29   GLUCOSE 119* 165* 95  BUN 10 22 24*  CREATININE 0.93 1.06 1.07  CALCIUM 8.8 9.2 9.3   Liver Function Tests:  Recent Labs Lab 04/13/14 1200  AST 23  ALT 14  ALKPHOS 95  BILITOT 0.4  PROT 7.7  ALBUMIN 3.1*  No results for input(s): LIPASE, AMYLASE in the last 168 hours. No results for input(s): AMMONIA in the last 168 hours. CBC:  Recent Labs Lab 04/12/14 1048 04/13/14 1200 04/14/14 0420 04/15/14 0440  WBC 16.5* 25.8* 25.7* 13.8*  NEUTROABS 12.8*  --  22.9*  --   HGB 12.6* 13.6 12.0* 12.7*  HCT 39.3 41.9 37.0* 39.5  MCV 86.0 85.9 86.2 86.6  PLT 172 223 194 188   Cardiac  Enzymes: No results for input(s): CKTOTAL, CKMB, CKMBINDEX, TROPONINI in the last 168 hours. BNP: BNP (last 3 results)  Recent Labs  04/12/14 1048  PROBNP 616.1*   CBG: No results for input(s): GLUCAP in the last 168 hours.   Signed:  CHIU, Scheryl Marten  [facility] 04/15/2014, 1:06 PM

## 2014-04-15 NOTE — Telephone Encounter (Signed)
RX has been called in. nothing further needed

## 2014-04-15 NOTE — Progress Notes (Signed)
Discharge to home, wife at bedside.  D/c instructions given to wife by Leana Roe.

## 2014-04-15 NOTE — Progress Notes (Signed)
O2 Sats post ambulation off O2 - 93%

## 2014-04-18 LAB — CULTURE, BLOOD (ROUTINE X 2)
CULTURE: NO GROWTH
Culture: NO GROWTH

## 2014-05-21 ENCOUNTER — Other Ambulatory Visit: Payer: Self-pay | Admitting: Critical Care Medicine

## 2014-05-29 ENCOUNTER — Encounter: Payer: Self-pay | Admitting: Critical Care Medicine

## 2014-05-29 ENCOUNTER — Ambulatory Visit (INDEPENDENT_AMBULATORY_CARE_PROVIDER_SITE_OTHER): Payer: Medicare Other | Admitting: Critical Care Medicine

## 2014-05-29 VITALS — BP 112/68 | HR 85 | Temp 97.6°F | Ht 67.0 in | Wt 176.0 lb

## 2014-05-29 DIAGNOSIS — J449 Chronic obstructive pulmonary disease, unspecified: Secondary | ICD-10-CM

## 2014-05-29 MED ORDER — TIOTROPIUM BROMIDE-OLODATEROL 2.5-2.5 MCG/ACT IN AERS: 2.0000 | INHALATION_SPRAY | Freq: Every day | RESPIRATORY_TRACT | Status: DC

## 2014-05-29 MED ORDER — AZITHROMYCIN 250 MG PO TABS: ORAL_TABLET | ORAL | Status: DC

## 2014-05-29 MED ORDER — METHYLPREDNISOLONE ACETATE 80 MG/ML IJ SUSP
120.0000 mg | Freq: Once | INTRAMUSCULAR | Status: AC
  Administered 2014-05-29: 120 mg via INTRAMUSCULAR

## 2014-05-29 MED ORDER — FLUTICASONE-SALMETEROL 250-50 MCG/DOSE IN AEPB: INHALATION_SPRAY | RESPIRATORY_TRACT | Status: DC

## 2014-05-29 NOTE — Progress Notes (Signed)
Subjective:    Patient ID: [name] [name], male    DOB: [birth year], 80 y.o.   MRN: [id number]  HPI.   05/29/2014 Chief Complaint  Patient presents with  . Post Hospital Follow up    Breathing and cough unchanged from d/c.  Has prod cough with white mucus, gets SOB "very easily," fatigue, wheezing, and chest tightness with activity.    Pt in hosp 04/2014 PNA .  Rx pred and levaquin.  Now off.  Now coughing more, still dyspneic.  Goes bed to chair only.      Review of Systems Constitutional:   No  weight loss, night sweats,  Fevers, chills, fatigue, lassitude. HEENT:   No headaches,  Difficulty swallowing,  Tooth/dental problems,  Sore throat,                No sneezing, itching, ear ache, nasal congestion, post nasal drip,   CV:  No chest pain,  Orthopnea, PND, swelling in lower extremities, anasarca, dizziness, palpitations  GI  No heartburn, indigestion, abdominal pain, nausea, vomiting, diarrhea, change in bowel habits, loss of appetite  Resp: No shortness of breath with exertion or at rest.  No excess mucus, no productive cough,  No non-productive cough,  No coughing up of blood.  No change in color of mucus.  No wheezing.  No chest wall deformity  Skin: no rash or lesions.  GU: no dysuria, change in color of urine, no urgency or frequency.  No flank pain.  MS:  No joint pain or swelling.  No decreased range of motion.  No back pain.  Psych:  No change in mood or affect. No depression or anxiety.  No memory loss.     Objective:   Physical Exam Filed Vitals:   05/29/14 1133  BP: 112/68  Pulse: 85  Temp: 97.6 F (36.4 C)  TempSrc: Oral  Height: 5\' 7"  (1.702 m)  Weight: 176 lb (79.833 kg)  SpO2: 93%    Gen: Pleasant, well-nourished, in no distress,  normal affect  ENT: No lesions,  mouth clear,  oropharynx clear, no postnasal drip  Neck: No JVD, no TMG, no carotid bruits  Lungs: No use of accessory muscles, no dullness to percussion, exp  wheezes  Cardiovascular: RRR, heart sounds normal, no murmur or gallops, no peripheral edema  Abdomen: soft and NT, no HSM,  BS normal  Musculoskeletal: No deformities, no cyanosis or clubbing  Neuro: alert, non focal  Skin: Warm, no lesions or rashes  No results found.        Assessment & Plan:   Obstructive chronic bronchitis without exacerbation Gold B Hx of recent CAP Now is better Copd gold B Still residual bronchitis  Plan Azithromycin 250mg  Take two once then one daily until gone Trial Stiolto two puff daily  HOLD Advair while on Stiolto Depomedrol injection 120mg  was given Return 2 months     Updated Medication List Outpatient Encounter Prescriptions as of 05/29/2014  Medication Sig  . acetaminophen (TYLENOL) 500 MG tablet Take 1,000 mg by mouth daily as needed for mild pain or headache.  aspirin 325 MG EC tablet Take 325 mg by mouth daily.    b complex vitamins tablet Take 1 tablet by mouth daily.  . cholecalciferol (VITAMIN D) 1000 UNITS tablet Take 1,000 Units by mouth daily.    05/31/2014 donepezil (ARICEPT) 10 MG tablet Take 20 mg by mouth daily.   . famotidine (PEPCID) 20 MG tablet Take 20 mg by mouth  as needed for heartburn.   . Fluticasone-Salmeterol (ADVAIR DISKUS) 250-50 MCG/DOSE AEPB HOLD while trying Stiolto. Can use up supply of Advair later if stiolto does not work  . ibuprofen (ADVIL,MOTRIN) 200 MG tablet Take 400 mg by mouth every 8 (eight) hours as needed. For pain.  Marland Kitchen leflunomide (ARAVA) 20 MG tablet Take 20 mg by mouth daily.  . nitroGLYCERIN (NITROSTAT) 0.4 MG SL tablet Place 0.4 mg under the tongue every 5 (five) minutes x 3 doses as needed. For chest pain.  . pravastatin (PRAVACHOL) 40 MG tablet take 1 tablet by mouth at bedtime  . PROAIR HFA 108 (90 BASE) MCG/ACT inhaler inhale 2 puffs by mouth every 4 hours if needed for wheezing  . traZODone (DESYREL) 50 MG tablet Take 50 mg by mouth at bedtime as needed for sleep.   . [DISCONTINUED]  Fluticasone-Salmeterol (ADVAIR DISKUS) 250-50 MCG/DOSE AEPB Inhale 1 puff into the lungs 2 (two) times daily.  . [DISCONTINUED] Fluticasone-Salmeterol (ADVAIR) 250-50 MCG/DOSE AEPB Inhale 1 puff into the lungs 2 (two) times daily.  Marland Kitchen azithromycin (ZITHROMAX) 250 MG tablet Take two once then one daily until gone  . Tiotropium Bromide-Olodaterol (STIOLTO RESPIMAT) 2.5-2.5 MCG/ACT AERS Inhale 2 puffs into the lungs daily.  . Tiotropium Bromide-Olodaterol (STIOLTO RESPIMAT) 2.5-2.5 MCG/ACT AERS Inhale 2 puffs into the lungs daily.  . [DISCONTINUED] co-enzyme Q-10 30 MG capsule Take 30 mg by mouth 3 (three) times daily.  . [DISCONTINUED] levofloxacin (LEVAQUIN) 750 MG tablet Take 1 tablet (750 mg total) by mouth daily. (Patient not taking: Reported on 05/29/2014)  . [DISCONTINUED] predniSONE (DELTASONE) 20 MG tablet Take 2 tablets (40 mg total) by mouth daily with breakfast. (Patient not taking: Reported on 05/29/2014)  . [EXPIRED] methylPREDNISolone acetate (DEPO-MEDROL) injection 120 mg

## 2014-05-29 NOTE — Patient Instructions (Signed)
Azithromycin 250mg  Take two once then one daily until gone Trial Stiolto two puff daily  HOLD Advair while on Stiolto Depomedrol injection 120mg  was given Return 2 months

## 2014-05-29 NOTE — Assessment & Plan Note (Signed)
Hx of recent CAP Now is better Copd gold B Still residual bronchitis  Plan Azithromycin 250mg  Take two once then one daily until gone Trial Stiolto two puff daily  HOLD Advair while on Stiolto Depomedrol injection 120mg  was given Return 2 months

## 2014-07-10 ENCOUNTER — Ambulatory Visit (INDEPENDENT_AMBULATORY_CARE_PROVIDER_SITE_OTHER): Payer: Medicare Other | Admitting: Adult Health

## 2014-07-10 ENCOUNTER — Inpatient Hospital Stay (HOSPITAL_COMMUNITY)
Admission: AD | Admit: 2014-07-10 | Discharge: 2014-07-16 | DRG: 190 | Disposition: A | Discharge status: Skilled Nursing Facility | Payer: Medicare Other | Source: Ambulatory Visit | Attending: Internal Medicine | Admitting: Internal Medicine

## 2014-07-10 ENCOUNTER — Inpatient Hospital Stay (HOSPITAL_COMMUNITY): Payer: Medicare Other

## 2014-07-10 ENCOUNTER — Encounter: Payer: Self-pay | Admitting: Adult Health

## 2014-07-10 ENCOUNTER — Encounter (HOSPITAL_COMMUNITY): Payer: Self-pay | Admitting: General Practice

## 2014-07-10 ENCOUNTER — Ambulatory Visit (INDEPENDENT_AMBULATORY_CARE_PROVIDER_SITE_OTHER)
Admission: RE | Admit: 2014-07-10 | Discharge: 2014-07-10 | Disposition: A | Discharge status: Home / Self Care | Payer: Medicare Other | Source: Ambulatory Visit | Attending: Adult Health | Admitting: Adult Health

## 2014-07-10 VITALS — BP 136/84 | HR 83 | Ht 67.0 in | Wt 175.6 lb

## 2014-07-10 DIAGNOSIS — M1990 Unspecified osteoarthritis, unspecified site: Secondary | ICD-10-CM | POA: Diagnosis present

## 2014-07-10 DIAGNOSIS — S2242XA Multiple fractures of ribs, left side, initial encounter for closed fracture: Secondary | ICD-10-CM | POA: Diagnosis not present

## 2014-07-10 DIAGNOSIS — J189 Pneumonia, unspecified organism: Secondary | ICD-10-CM | POA: Insufficient documentation

## 2014-07-10 DIAGNOSIS — Z87891 Personal history of nicotine dependence: Secondary | ICD-10-CM

## 2014-07-10 DIAGNOSIS — J441 Chronic obstructive pulmonary disease with (acute) exacerbation: Secondary | ICD-10-CM | POA: Diagnosis present

## 2014-07-10 DIAGNOSIS — Z9861 Coronary angioplasty status: Secondary | ICD-10-CM | POA: Diagnosis not present

## 2014-07-10 DIAGNOSIS — Z79899 Other long term (current) drug therapy: Secondary | ICD-10-CM | POA: Diagnosis not present

## 2014-07-10 DIAGNOSIS — F0390 Unspecified dementia without behavioral disturbance: Secondary | ICD-10-CM | POA: Diagnosis present

## 2014-07-10 DIAGNOSIS — J9620 Acute and chronic respiratory failure, unspecified whether with hypoxia or hypercapnia: Secondary | ICD-10-CM | POA: Diagnosis present

## 2014-07-10 DIAGNOSIS — E785 Hyperlipidemia, unspecified: Secondary | ICD-10-CM | POA: Diagnosis present

## 2014-07-10 DIAGNOSIS — Z888 Allergy status to other drugs, medicaments and biological substances status: Secondary | ICD-10-CM

## 2014-07-10 DIAGNOSIS — Z7982 Long term (current) use of aspirin: Secondary | ICD-10-CM

## 2014-07-10 DIAGNOSIS — W19XXXA Unspecified fall, initial encounter: Secondary | ICD-10-CM | POA: Diagnosis not present

## 2014-07-10 DIAGNOSIS — M069 Rheumatoid arthritis, unspecified: Secondary | ICD-10-CM | POA: Diagnosis present

## 2014-07-10 DIAGNOSIS — Z880 Allergy status to penicillin: Secondary | ICD-10-CM | POA: Diagnosis not present

## 2014-07-10 DIAGNOSIS — Z9181 History of falling: Secondary | ICD-10-CM | POA: Diagnosis not present

## 2014-07-10 DIAGNOSIS — I2510 Atherosclerotic heart disease of native coronary artery without angina pectoris: Secondary | ICD-10-CM | POA: Diagnosis present

## 2014-07-10 DIAGNOSIS — Z95820 Peripheral vascular angioplasty status with implants and grafts: Secondary | ICD-10-CM

## 2014-07-10 DIAGNOSIS — J449 Chronic obstructive pulmonary disease, unspecified: Secondary | ICD-10-CM

## 2014-07-10 DIAGNOSIS — Z91041 Radiographic dye allergy status: Secondary | ICD-10-CM | POA: Diagnosis not present

## 2014-07-10 DIAGNOSIS — I739 Peripheral vascular disease, unspecified: Secondary | ICD-10-CM | POA: Diagnosis present

## 2014-07-10 DIAGNOSIS — I10 Essential (primary) hypertension: Secondary | ICD-10-CM | POA: Diagnosis present

## 2014-07-10 DIAGNOSIS — Z86010 Personal history of colonic polyps: Secondary | ICD-10-CM | POA: Diagnosis not present

## 2014-07-10 DIAGNOSIS — J9811 Atelectasis: Secondary | ICD-10-CM

## 2014-07-10 DIAGNOSIS — R079 Chest pain, unspecified: Secondary | ICD-10-CM

## 2014-07-10 DIAGNOSIS — J9801 Acute bronchospasm: Secondary | ICD-10-CM | POA: Diagnosis present

## 2014-07-10 LAB — GLUCOSE, CAPILLARY: Glucose-Capillary: 85 mg/dL (ref 70–99)

## 2014-07-10 LAB — URINALYSIS, ROUTINE W REFLEX MICROSCOPIC
BILIRUBIN URINE: NEGATIVE
Glucose, UA: NEGATIVE mg/dL
KETONES UR: NEGATIVE mg/dL
Leukocytes, UA: NEGATIVE
Nitrite: NEGATIVE
Protein, ur: >300 mg/dL — AB
Specific Gravity, Urine: 1.015 (ref 1.005–1.030)
Urobilinogen, UA: 1 mg/dL (ref 0.0–1.0)
pH: 5.5 (ref 5.0–8.0)

## 2014-07-10 LAB — COMPREHENSIVE METABOLIC PANEL
ALT: 14 U/L (ref 0–53)
ANION GAP: 7 (ref 5–15)
AST: 27 U/L (ref 0–37)
Albumin: 3.1 g/dL — ABNORMAL LOW (ref 3.5–5.2)
Alkaline Phosphatase: 86 U/L (ref 39–117)
BUN: 8 mg/dL (ref 6–23)
CO2: 29 mmol/L (ref 19–32)
Calcium: 8.2 mg/dL — ABNORMAL LOW (ref 8.4–10.5)
Chloride: 99 mmol/L (ref 96–112)
Creatinine, Ser: 1.09 mg/dL (ref 0.50–1.35)
GFR calc Af Amer: 72 mL/min — ABNORMAL LOW (ref >90)
GFR, EST NON AFRICAN AMERICAN: 62 mL/min — AB (ref >90)
GLUCOSE: 130 mg/dL — AB (ref 70–99)
Potassium: 3.5 mmol/L (ref 3.5–5.1)
SODIUM: 135 mmol/L (ref 135–145)
Total Bilirubin: 0.9 mg/dL (ref 0.3–1.2)
Total Protein: 6.1 g/dL (ref 6.0–8.3)

## 2014-07-10 LAB — CBC WITH DIFFERENTIAL/PLATELET
Basophils Absolute: 0.1 10*3/uL (ref 0.0–0.1)
Basophils Relative: 1 % (ref 0–1)
Eosinophils Absolute: 1 10*3/uL — ABNORMAL HIGH (ref 0.0–0.7)
Eosinophils Relative: 11 % — ABNORMAL HIGH (ref 0–5)
HEMATOCRIT: 39.8 % (ref 39.0–52.0)
Hemoglobin: 13.1 g/dL (ref 13.0–17.0)
LYMPHS ABS: 1.6 10*3/uL (ref 0.7–4.0)
Lymphocytes Relative: 17 % (ref 12–46)
MCH: 28.6 pg (ref 26.0–34.0)
MCHC: 32.9 g/dL (ref 30.0–36.0)
MCV: 86.9 fL (ref 78.0–100.0)
Monocytes Absolute: 0.5 10*3/uL (ref 0.1–1.0)
Monocytes Relative: 5 % (ref 3–12)
NEUTROS PCT: 66 % (ref 43–77)
Neutro Abs: 6.2 10*3/uL (ref 1.7–7.7)
Platelets: 174 10*3/uL (ref 150–400)
RBC: 4.58 MIL/uL (ref 4.22–5.81)
RDW: 13.9 % (ref 11.5–15.5)
WBC: 9.3 10*3/uL (ref 4.0–10.5)

## 2014-07-10 LAB — URINE MICROSCOPIC-ADD ON

## 2014-07-10 LAB — BRAIN NATRIURETIC PEPTIDE: B Natriuretic Peptide: 67.9 pg/mL (ref 0.0–100.0)

## 2014-07-10 MED ORDER — SODIUM CHLORIDE 0.9 % IJ SOLN
3.0000 mL | Freq: Two times a day (BID) | INTRAMUSCULAR | Status: DC
  Administered 2014-07-10 – 2014-07-16 (×12): 3 mL via INTRAVENOUS

## 2014-07-10 MED ORDER — HALOPERIDOL LACTATE 5 MG/ML IJ SOLN
5.0000 mg | INTRAMUSCULAR | Status: DC | PRN
  Administered 2014-07-10 – 2014-07-16 (×7): 5 mg via INTRAVENOUS
  Filled 2014-07-10 (×8): qty 1

## 2014-07-10 MED ORDER — IPRATROPIUM BROMIDE 0.02 % IN SOLN
0.5000 mg | Freq: Four times a day (QID) | RESPIRATORY_TRACT | Status: DC
  Administered 2014-07-10: 0.5 mg via RESPIRATORY_TRACT
  Filled 2014-07-10: qty 2.5

## 2014-07-10 MED ORDER — SODIUM CHLORIDE 0.9 % IV SOLN: 250.0000 mL | INTRAVENOUS | Status: DC | PRN

## 2014-07-10 MED ORDER — ONDANSETRON HCL 4 MG PO TABS: 4.0000 mg | ORAL_TABLET | Freq: Four times a day (QID) | ORAL | Status: DC | PRN

## 2014-07-10 MED ORDER — METHYLPREDNISOLONE SODIUM SUCC 40 MG IJ SOLR
40.0000 mg | Freq: Three times a day (TID) | INTRAMUSCULAR | Status: DC
  Administered 2014-07-10 – 2014-07-11 (×3): 40 mg via INTRAVENOUS
  Filled 2014-07-10 (×6): qty 1

## 2014-07-10 MED ORDER — LEVOFLOXACIN IN D5W 500 MG/100ML IV SOLN
500.0000 mg | INTRAVENOUS | Status: DC
  Administered 2014-07-10 – 2014-07-14 (×5): 500 mg via INTRAVENOUS
  Filled 2014-07-10 (×6): qty 100

## 2014-07-10 MED ORDER — SENNOSIDES-DOCUSATE SODIUM 8.6-50 MG PO TABS
1.0000 | ORAL_TABLET | Freq: Every evening | ORAL | Status: DC | PRN
  Administered 2014-07-13: 1 via ORAL
  Filled 2014-07-10: qty 1

## 2014-07-10 MED ORDER — ASPIRIN EC 325 MG PO TBEC
325.0000 mg | DELAYED_RELEASE_TABLET | Freq: Every day | ORAL | Status: DC
  Administered 2014-07-10 – 2014-07-16 (×7): 325 mg via ORAL
  Filled 2014-07-10 (×7): qty 1

## 2014-07-10 MED ORDER — BUDESONIDE 0.25 MG/2ML IN SUSP
0.2500 mg | Freq: Two times a day (BID) | RESPIRATORY_TRACT | Status: DC
  Administered 2014-07-11 – 2014-07-15 (×9): 0.25 mg via RESPIRATORY_TRACT
  Filled 2014-07-10 (×17): qty 2

## 2014-07-10 MED ORDER — DONEPEZIL HCL 10 MG PO TABS
20.0000 mg | ORAL_TABLET | Freq: Every day | ORAL | Status: DC
  Administered 2014-07-10 – 2014-07-15 (×6): 20 mg via ORAL
  Filled 2014-07-10 (×9): qty 2

## 2014-07-10 MED ORDER — HEPARIN SODIUM (PORCINE) 5000 UNIT/ML IJ SOLN
5000.0000 [IU] | Freq: Three times a day (TID) | INTRAMUSCULAR | Status: DC
  Administered 2014-07-10 – 2014-07-16 (×18): 5000 [IU] via SUBCUTANEOUS
  Filled 2014-07-10 (×25): qty 1

## 2014-07-10 MED ORDER — ALBUTEROL SULFATE (2.5 MG/3ML) 0.083% IN NEBU
2.5000 mg | INHALATION_SOLUTION | RESPIRATORY_TRACT | Status: DC | PRN
  Administered 2014-07-10 – 2014-07-11 (×4): 2.5 mg via RESPIRATORY_TRACT
  Filled 2014-07-10 (×5): qty 3

## 2014-07-10 MED ORDER — ACETAMINOPHEN 650 MG RE SUPP: 650.0000 mg | Freq: Four times a day (QID) | RECTAL | Status: DC | PRN

## 2014-07-10 MED ORDER — SODIUM CHLORIDE 0.9 % IJ SOLN
3.0000 mL | INTRAMUSCULAR | Status: DC | PRN
  Administered 2014-07-14: 3 mL via INTRAVENOUS
  Filled 2014-07-10: qty 3

## 2014-07-10 MED ORDER — IPRATROPIUM-ALBUTEROL 0.5-2.5 (3) MG/3ML IN SOLN
3.0000 mL | Freq: Four times a day (QID) | RESPIRATORY_TRACT | Status: DC
  Administered 2014-07-10 – 2014-07-16 (×17): 3 mL via RESPIRATORY_TRACT
  Filled 2014-07-10 (×22): qty 3

## 2014-07-10 MED ORDER — PRAVASTATIN SODIUM 40 MG PO TABS
40.0000 mg | ORAL_TABLET | Freq: Every day | ORAL | Status: DC
  Administered 2014-07-10 – 2014-07-15 (×6): 40 mg via ORAL
  Filled 2014-07-10 (×8): qty 1

## 2014-07-10 MED ORDER — ALBUTEROL SULFATE (2.5 MG/3ML) 0.083% IN NEBU
2.5000 mg | INHALATION_SOLUTION | Freq: Four times a day (QID) | RESPIRATORY_TRACT | Status: DC
  Administered 2014-07-10: 2.5 mg via RESPIRATORY_TRACT
  Filled 2014-07-10: qty 3

## 2014-07-10 MED ORDER — FENTANYL CITRATE 0.05 MG/ML IJ SOLN
12.5000 ug | INTRAMUSCULAR | Status: DC | PRN
  Administered 2014-07-10 – 2014-07-11 (×3): 25 ug via INTRAVENOUS
  Administered 2014-07-13 (×2): 12.5 ug via INTRAVENOUS
  Administered 2014-07-14 – 2014-07-16 (×3): 25 ug via INTRAVENOUS
  Filled 2014-07-10 (×9): qty 2

## 2014-07-10 MED ORDER — GUAIFENESIN ER 600 MG PO TB12
600.0000 mg | ORAL_TABLET | Freq: Two times a day (BID) | ORAL | Status: DC
  Administered 2014-07-10 – 2014-07-16 (×13): 600 mg via ORAL
  Filled 2014-07-10 (×16): qty 1

## 2014-07-10 MED ORDER — LEVALBUTEROL HCL 0.63 MG/3ML IN NEBU
0.6300 mg | INHALATION_SOLUTION | Freq: Once | RESPIRATORY_TRACT | Status: AC
  Administered 2014-07-10: 0.63 mg via RESPIRATORY_TRACT

## 2014-07-10 MED ORDER — PANTOPRAZOLE SODIUM 40 MG PO TBEC
40.0000 mg | DELAYED_RELEASE_TABLET | Freq: Every day | ORAL | Status: DC
  Administered 2014-07-10 – 2014-07-16 (×7): 40 mg via ORAL
  Filled 2014-07-10 (×7): qty 1

## 2014-07-10 MED ORDER — FAMOTIDINE 20 MG PO TABS
20.0000 mg | ORAL_TABLET | Freq: Every day | ORAL | Status: DC
  Administered 2014-07-10 – 2014-07-15 (×6): 20 mg via ORAL
  Filled 2014-07-10 (×9): qty 1

## 2014-07-10 MED ORDER — ACETAMINOPHEN 325 MG PO TABS
650.0000 mg | ORAL_TABLET | Freq: Four times a day (QID) | ORAL | Status: DC | PRN
  Administered 2014-07-11 – 2014-07-14 (×4): 650 mg via ORAL
  Filled 2014-07-10 (×4): qty 2

## 2014-07-10 MED ORDER — ONDANSETRON HCL 4 MG/2ML IJ SOLN: 4.0000 mg | Freq: Four times a day (QID) | INTRAMUSCULAR | Status: DC | PRN

## 2014-07-10 MED ORDER — IPRATROPIUM-ALBUTEROL 0.5-2.5 (3) MG/3ML IN SOLN
RESPIRATORY_TRACT | Status: AC
  Administered 2014-07-10: 3 mL via RESPIRATORY_TRACT
  Filled 2014-07-10: qty 3

## 2014-07-10 NOTE — Progress Notes (Signed)
Subjective:    Patient ID: [name] [name], male    DOB: 1931/07/07, 81 y.o.   MRN: [id number]  HPI 81 yo with COPD -Gold B   07/10/2014 Acute OV  Patient presents for an acute office visit Patient complains of 3 days of cough, congestion, thick mucus, wheezing and dyspnea.  Wife says he has been very weak. She is having a hard time taking care of him.  Patient denies any hemoptysis, chest pain, orthopnea, PND or leg swelling Was changed to Stiolto last ov from Advair . Did not like Stiolto. Back on Advair.  CXR today shows worsening RML /RLL consolidation (compared to 03/2014)  Appetite is fair .  Last admission was in October 2015.        Review of Systems Constitutional:   No  weight loss, night sweats,  Fevers, chills, + fatigue, or  lassitude.  HEENT:   No headaches,  Difficulty swallowing,  Tooth/dental problems, or  Sore throat,                No sneezing, itching, ear ache,  +nasal congestion, post nasal drip,   CV:  No chest pain,  Orthopnea, PND, swelling in lower extremities, anasarca, dizziness, palpitations, syncope.   GI  No heartburn, indigestion, abdominal pain, nausea, vomiting, diarrhea, change in bowel habits, loss of appetite, bloody stools.   Resp:   No chest wall deformity  Skin: no rash or lesions.  GU: no dysuria, change in color of urine, no urgency or frequency.  No flank pain, no hematuria   MS:  No joint pain or swelling.  No decreased range of motion.  No back pain.  Psych:  No change in mood or affect. No depression or anxiety. +chronic  memory loss.         Objective:   Physical Exam GEN: A/Ox3; pleasant , NAD, frail and elderly in wheelchair   HEENT:  [place]/AT,  EACs-clear, TMs-wnl, NOSE-clear drainage , THROAT-clear, no lesions, no postnasal drip or exudate noted.   NECK:  Supple w/ fair ROM; no JVD; normal carotid impulses w/o bruits; no thyromegaly or nodules palpated; no lymphadenopathy.  RESP  Scattered rhonchi and exp  wheezing , R>L no accessory muscle use, no dullness to percussion  CARD:  RRR, no m/r/g  , no peripheral edema, pulses intact, no cyanosis or clubbing.  GI:   Soft & nt; nml bowel sounds; no organomegaly or masses detected.  Musco: Warm bil, no deformities or joint swelling noted.   Neuro: alert, no focal deficits noted.    Skin: Warm, no lesions or rashes  CXR  07/10/2014  COMPARISON: Portable chest x-ray of 04/14/2014 and two-view chest x-ray of 04/12/2014  FINDINGS: There is more opacity at the right lung base primarily in the right middle lobe suspicious for worsening pneumonia. The lungs are hyperaerated consistent with emphysema with flattened hemidiaphragms and increased AP diameter. Somewhat more prominent markings also are noted in the right upper lobe anteriorly on the lateral view. The abnormality of the right medial lung base appears to have been present previously and may represent scarring. However in view of the persistent pneumonia involving the right lower lobe and possibly right middle lobe, CT of the chest may be helpful to exclude an underlying neoplasm. The left lung is clear. Mediastinal and hilar contours are unchanged. There is some peribronchial thickening present. The heart is mildly enlarged and stable. No acute bony abnormality is seen.  IMPRESSION: 1. Worsening of right middle lobe  and possibly right lower lobe pneumonia. 2. Irregularity of the right infrahilar region may be due to scarring but recommend CTof the chest to exclude an underlying neoplasm. 3. Emphysema.       Assessment & Plan:

## 2014-07-10 NOTE — Progress Notes (Signed)
UR Completed.  [phone]  

## 2014-07-10 NOTE — H&P (Signed)
Name: [name] [name] MRN: [id number] DOB: February 13, 1932    ADMISSION DATE:  (Not on file)    CHIEF COMPLAINT:  Sob   BRIEF PATIENT DESCRIPTION:  81 yowm  former smoker,  office pt of Dr. Delford Field ,  Admitted from office with COPD exacerbation and increased consolidation on RML/RLL -not resolved from 03/2014 . Has RA on Arava .   SIGNIFICANT EVENTS  Admit from office 1/28 to [facility]   STUDIES:  CT chest 1/28>>   HISTORY OF PRESENT ILLNESS:   81 yo with COPD -(Gold II B FeV1 71%  Fev1/FVC 58%   04/23/2012)  07/10/2014 Acute OV  Patient presents for an acute office visit to pulmonary clinic.  Patient complains of 3 days of cough, congestion, thick mucus, wheezing and dyspnea.  Wife says he has been very weak. She is having a hard time taking care of him.  Patient denies any hemoptysis, chest pain, orthopnea, PND or leg swelling Was changed to Stiolto last ov from Advair . Did not like Stiolto/hard to trigger>>. Back on Advair.  CXR today shows worsening RML /RLL consolidation (compared to 03/2014) ? obst mass  Appetite is fair .  Last admission was in October 2015. Will require admission for further treatment and evaluation.   PAST MEDICAL HISTORY :   has a past medical history of Peripheral vascular disease; Osteoarthritis; Hypertension; Hyperlipidemia; CAD (coronary artery disease); COPD (chronic obstructive pulmonary disease); History of colonic polyps; Dementia; Back pain; and Rheumatoid arthritis(714.0) (2013).  has past surgical history that includes Iliac artery stent and Coronary angioplasty (08/2009). Prior to Admission medications   Medication Sig Start Date End Date Taking? Authorizing Provider  acetaminophen (TYLENOL) 500 MG tablet Take 1,000 mg by mouth daily as needed for mild pain or headache.    Historical Provider, MD  aspirin 325 MG EC tablet Take 325 mg by mouth daily.      Historical Provider, MD  azithromycin (ZITHROMAX) 250 MG tablet Take two once then one  daily until gone 05/29/14   Storm Frisk, MD  b complex vitamins tablet Take 1 tablet by mouth daily.    Historical Provider, MD  cholecalciferol (VITAMIN D) 1000 UNITS tablet Take 1,000 Units by mouth daily.      Historical Provider, MD  donepezil (ARICEPT) 10 MG tablet Take 20 mg by mouth daily.     Historical Provider, MD  famotidine (PEPCID) 20 MG tablet Take 20 mg by mouth as needed for heartburn.     Historical Provider, MD  Fluticasone-Salmeterol (ADVAIR DISKUS) 250-50 MCG/DOSE AEPB HOLD while trying Stiolto. Can use up supply of Advair later if stiolto does not work 05/29/14   Storm Frisk, MD  ibuprofen (ADVIL,MOTRIN) 200 MG tablet Take 400 mg by mouth every 8 (eight) hours as needed. For pain.    Historical Provider, MD  leflunomide (ARAVA) 20 MG tablet Take 20 mg by mouth daily.    Historical Provider, MD  nitroGLYCERIN (NITROSTAT) 0.4 MG SL tablet Place 0.4 mg under the tongue every 5 (five) minutes x 3 doses as needed. For chest pain.    Historical Provider, MD  pravastatin (PRAVACHOL) 40 MG tablet take 1 tablet by mouth at bedtime    Wendall Stade, MD  [facility] HFA 108 (90 BASE) MCG/ACT inhaler inhale 2 puffs by mouth every 4 hours if needed for wheezing 05/22/14   Storm Frisk, MD  Tiotropium Bromide-Olodaterol (STIOLTO RESPIMAT) 2.5-2.5 MCG/ACT AERS Inhale 2 puffs into the lungs daily. Patient not  taking: Reported on 07/10/2014 05/29/14   Storm Frisk, MD  traZODone (DESYREL) 50 MG tablet Take 50 mg by mouth at bedtime as needed for sleep.     Historical Provider, MD   Allergies  Allergen Reactions  . Iohexol      Desc: Increased difficulty breathing, Onset Date: [id number]   . Penicillins   . Zolpidem Tartrate     REACTION: Acute delirium    FAMILY HISTORY:  family history is not on file. SOCIAL HISTORY:  reports that he quit smoking about 29 years ago. His smoking use included Cigarettes. He has a 45 pack-year smoking history. He has never used smokeless  tobacco. He reports that he does not drink alcohol or use illicit drugs.  REVIEW OF SYSTEMS:   Constitutional: No weight loss, night sweats, Fevers, chills, + fatigue, or lassitude.  HEENT: No headaches, Difficulty swallowing, Tooth/dental problems, or Sore throat,   No sneezing, itching, ear ache,  +nasal congestion, post nasal drip,   CV: No chest pain, Orthopnea, PND, swelling in lower extremities, anasarca, dizziness, palpitations, syncope.   GI No heartburn, indigestion, abdominal pain, nausea, vomiting, diarrhea, change in bowel habits, loss of appetite, bloody stools.   Resp: No chest wall deformity  Skin: no rash or lesions.  GU: no dysuria, change in color of urine, no urgency or frequency. No flank pain, no hematuria   MS: No joint pain or swelling. No decreased range of motion. No back pain.  Psych: No change in mood or affect. No depression or anxiety. +chronic memory loss.   SUBJECTIVE:  Sitting in w/c better sob p neb rx   VITAL SIGNS: Pulse Rate:  [83] 83 (01/28 0927) BP: (136)/(84) 136/84 mmHg (01/28 0927) SpO2:  [91 %] 91 % (01/28 0927) Weight:  [175 lb 9.6 oz (79.652 kg)] 175 lb 9.6 oz (79.652 kg) (01/28 0927)  PHYSICAL EXAMINATION: GEN: A/Ox3; pleasant , NAD, frail and elderly in wheelchair / obvious large airway noises   HEENT: [place]/AT, EACs-clear, TMs-wnl, NOSE-clear drainage , THROAT-clear, no lesions, no postnasal drip or exudate noted.   NECK: Supple w/ fair ROM; no JVD; normal carotid impulses w/o bruits; no thyromegaly or nodules palpated; no lymphadenopathy.   RESP Prominent exp> insp rhonchi, R>>L no accessory muscle use, no dullness to percussion  CARD: RRR, no m/r/g , no peripheral edema, pulses intact, no cyanosis or clubbing.  GI: Soft & nt; nml bowel sounds; no organomegaly or masses detected.   Musco: Warm bil, no deformities or joint swelling noted.   Neuro: alert, no focal deficits noted.     Skin: Warm, no lesions or rashes    No results for input(s): NA, K, CL, CO2, BUN, CREATININE, GLUCOSE in the last 168 hours. No results for input(s): HGB, HCT, WBC, PLT in the last 168 hours. Dg Chest 2 View  07/10/2014   CLINICAL DATA:  Cough, congestion, wheezing, shortness of breath, former smoking history  EXAM: CHEST  2 VIEW  COMPARISON:  Portable chest x-ray of 04/14/2014 and two-view chest x-ray of 04/12/2014  FINDINGS: There is more opacity at the right lung base primarily in the right middle lobe suspicious for worsening pneumonia. The lungs are hyperaerated consistent with emphysema with flattened hemidiaphragms and increased AP diameter. Somewhat more prominent markings also are noted in the right upper lobe anteriorly on the lateral view. The abnormality of the right medial lung base appears to have been present previously and may represent scarring. However in view of the persistent pneumonia involving  the right lower lobe and possibly right middle lobe, CT of the chest may be helpful to exclude an underlying neoplasm. The left lung is clear. Mediastinal and hilar contours are unchanged. There is some peribronchial thickening present. The heart is mildly enlarged and stable. No acute bony abnormality is seen.  IMPRESSION: 1. Worsening of right middle lobe and possibly right lower lobe pneumonia. 2. Irregularity of the right infrahilar region may be due to scarring but recommend CTof the chest to exclude an underlying neoplasm. 3. Emphysema.   Electronically Signed   By: Dwyane Dee M.D.   On: 07/10/2014 10:02    ASSESSMENT / PLAN: 1. RML /RLL PNA -recurrent PNA vs RML syndrome vs Mass with post obst pna  -immunocompromised pt (on Arava)   Plan  Admit  Set up for CT chest  Begin IV abx -Levaquin  Check sputum cx  Check strep pnueumonia/leigonella.  Labs pending .    2. COPD exacerbation  Plan  Hold Advair with upper airway noises so prominent  Begin IV Solumedrol 40 q8 -taper  as able  BD Nebs  O2 to keep sats >90     3. Dementia  Plan  Continue Aricept  Monitor for delirium with steroids  4. RA Plan   Hold Arava  Steroids     PARRETT,TAMMY NP-C  Pulmonary and Critical Care Medicine [facility] Pager: [phone]  07/10/2014, 11:13 AM   I personally reviewed the patients records and xray series and examined the pt and  reviewed them my findings with the patient and his wife.  My concern is that he probably has an obstructing mass in the R beyond the RUL take off with ? Post obst atx vs pna and ideally needs fob but not feasible to attempt to do this as outpatient so best to admit and see if we can improve his breathing prior to attempting an invasive studies   .Sandrea Hughs, MD Pulmonary and Critical Care Medicine [facility] Cell [phone] After 5:30 PM or weekends, call [phone]

## 2014-07-10 NOTE — Patient Instructions (Signed)
Admit to hospital 

## 2014-07-10 NOTE — Addendum Note (Signed)
Addended by: Charlott Holler on: 07/10/2014 11:21 AM   Modules accepted: Orders

## 2014-07-10 NOTE — Assessment & Plan Note (Signed)
Exacerbation with associated ? PNA Will need admit , see H/P notes

## 2014-07-11 LAB — BASIC METABOLIC PANEL
Anion gap: 8 (ref 5–15)
BUN: 9 mg/dL (ref 6–23)
CALCIUM: 8.7 mg/dL (ref 8.4–10.5)
CO2: 27 mmol/L (ref 19–32)
Chloride: 102 mmol/L (ref 96–112)
Creatinine, Ser: 1.11 mg/dL (ref 0.50–1.35)
GFR calc Af Amer: 70 mL/min — ABNORMAL LOW (ref >90)
GFR, EST NON AFRICAN AMERICAN: 61 mL/min — AB (ref >90)
GLUCOSE: 182 mg/dL — AB (ref 70–99)
Potassium: 4.1 mmol/L (ref 3.5–5.1)
SODIUM: 137 mmol/L (ref 135–145)

## 2014-07-11 LAB — CBC
HEMATOCRIT: 42 % (ref 39.0–52.0)
HEMOGLOBIN: 13.9 g/dL (ref 13.0–17.0)
MCH: 28.4 pg (ref 26.0–34.0)
MCHC: 33.1 g/dL (ref 30.0–36.0)
MCV: 85.9 fL (ref 78.0–100.0)
PLATELETS: 202 10*3/uL (ref 150–400)
RBC: 4.89 MIL/uL (ref 4.22–5.81)
RDW: 13.9 % (ref 11.5–15.5)
WBC: 6.2 10*3/uL (ref 4.0–10.5)

## 2014-07-11 LAB — GLUCOSE, CAPILLARY: Glucose-Capillary: 159 mg/dL — ABNORMAL HIGH (ref 70–99)

## 2014-07-11 MED ORDER — METHYLPREDNISOLONE SODIUM SUCC 40 MG IJ SOLR
40.0000 mg | Freq: Two times a day (BID) | INTRAMUSCULAR | Status: DC
  Administered 2014-07-11 – 2014-07-15 (×8): 40 mg via INTRAVENOUS
  Filled 2014-07-11 (×10): qty 1

## 2014-07-11 NOTE — Progress Notes (Signed)
0510 Patient awaken very confused and agitated. Refuse to allow lab to draw blood. Patient assisted to bathroom and back to bed by wife. Patient very sob and wheezing. Respiratory treatment given. 0524 Haldol 5mg  given IV and fentanyl given IV. 0600 Patient is resting will continue monitor.

## 2014-07-11 NOTE — Progress Notes (Signed)
PT PROFILE:  81 yo with COPD -(Gold II B FeV1 71% Fev1/FVC 58% 04/23/2012) 07/10/2014 Acute OV  Patient presents for an acute office visit to pulmonary clinic.  Patient complains of 3 days of cough, congestion, thick mucus, wheezing and dyspnea.  Wife says he has been very weak. She is having a hard time taking care of him.  Patient denies any hemoptysis, chest pain, orthopnea, PND or leg swelling Was changed to Stiolto last ov from Advair . Did not like Stiolto/hard to trigger>>. Back on Advair.  CXR today shows worsening RML /RLL consolidation (compared to 03/2014) ? obst mass  Appetite is fair .  Last admission was in October 2015. Will require admission for further treatment and evaluation.    SUBJ  No new complaints Expresses uncertainty as to why he is hospital. "i didn't know I was sick" No distress   OBJ:   Filed Vitals:   07/10/14 1354 07/10/14 2136 07/11/14 0525 07/11/14 1254  BP: 106/82 142/70 177/127 165/85  Pulse: 60 84 108 86  Temp: 97.8 F (36.6 C) 98.4 F (36.9 C) 98.8 F (37.1 C) 97.9 F (36.6 C)  TempSrc: Oral Oral Oral Oral  Resp: 18 19 21 18   Height:      Weight:      SpO2: 92% 92% 97% 93%   NAD No JVD Diffuse scattered wheezes RRR NABS No edema  I have reviewed all of today's lab results. Relevant abnormalities are discussed in the A/P section  CT chest: RML changes appear chronic. RLL diffuse nodular infiltrate  IMPRESSION: COPD with acute bronchospasm Abnormal CT chest - RML findings appear chronic  RLL changes are c/w atypical infectious/inflammatory process - consider atypical mycobateria  PLAN: Cont nebulized steroids and BDs through WE Cont systemic steroids - dose adjusted Cont Levofloxacin Follow CXR over time to resolution  If non-resolving, further eval per Dr as indicated  Sherene Sires, MD ; [facility] [phone].  After 5:30 PM or weekends, call [phone]

## 2014-07-11 NOTE — Progress Notes (Signed)
Medicare IM (Important Message) delivered to patient today by me in anticipation of discharge.   Lydell Moga, RN BSN [facility] CCM  Case Manager, Trauma Service/Unit 3M [phone]  

## 2014-07-12 LAB — GLUCOSE, CAPILLARY: Glucose-Capillary: 107 mg/dL — ABNORMAL HIGH (ref 70–99)

## 2014-07-12 NOTE — Progress Notes (Signed)
76 Wife call and stated patient had fell. Went to room patient sitting on the side of the bed. Wife stated that patient got up and she did not hear him till he fell. Patient alert to person and place. Wife stated he did not hit his head and patient stated he did not hit his head. Patient stated his ankle turn and that is why he fell. Patient has 3 skin tears to left arm area clean telfa and kerlix applied. Patient as abrasion to left knee area clean and adhesive bandage applied. Patient small abrasion on left ankle. T98.1 P71 R20 B/P 155/83 O2 sat 96 RA. Patient voices no c/o of pain. Patient and wife was told that the bed alarm would have to be used. Wife agreed. Wife I stay so he would not fall. Patient resting in bed and bedalarm on. 0545 Dr. Darrick Penna notified no orders received will continue to monitor

## 2014-07-12 NOTE — Progress Notes (Signed)
PT PROFILE:  81 yo with COPD -(Gold II B FeV1 71% Fev1/FVC 58% 04/23/2012) 07/10/2014 Acute OV  Patient presents for an acute office visit to pulmonary clinic.  Patient complains of 3 days of cough, congestion, thick mucus, wheezing and dyspnea.  Wife says he has been very weak. She is having a hard time taking care of him.  Patient denies any hemoptysis, chest pain, orthopnea, PND or leg swelling Was changed to Stiolto last ov from Advair . Did not like Stiolto/hard to trigger>>. Back on Advair.  CXR today shows worsening RML /RLL consolidation (compared to 03/2014) ? obst mass  Appetite is fair .  Last admission was in October 2015. Will require admission for further treatment and evaluation.    SUBJ Pt had fall today with some abrasions, but did not hit head and mentation is normal currently.  Still coughing up mucus, but no increased wob.   OBJCeasar Mons Vitals:   07/11/14 1254 07/11/14 2129 07/12/14 0555 07/12/14 0937  BP: 165/85 152/89 155/83   Pulse: 86 79 71   Temp: 97.9 F (36.6 C) 97.3 F (36.3 C) 98.1 F (36.7 C)   TempSrc: Oral Oral Oral   Resp: 18 20 20    Height:      Weight:      SpO2:   96% 96%   Wd male in nad Nose without purulence or d/c noted. Neck without LN or TMG Chest with a few rhonch, but no wheezing.  Adequate airflow Cor with rrr Abd soft, NT ND, BS+ LE with minimal edema, no cyanosis  IMPRESSION: 1) COPD with acute bronchospasm Pt feels he is better, but still with cough/purulence and some increased sob over baseline.  Will continue on aggressive treatment  2) Abnormal CT chest - RML findings appear chronic  RLL changes are c/w atypical infectious/inflammatory process - consider atypical mycobateria Unclear whether this represents an acute pna.  Will continue on abx for now  PLAN: Cont BD regimen Cont systemic steroids  Cont Levofloxacin Follow CXR over time to resolution  If non-resolving, further eval per Dr as  indicated

## 2014-07-13 ENCOUNTER — Inpatient Hospital Stay (HOSPITAL_COMMUNITY): Payer: Medicare Other

## 2014-07-13 LAB — GLUCOSE, CAPILLARY: GLUCOSE-CAPILLARY: 114 mg/dL — AB (ref 70–99)

## 2014-07-13 NOTE — Progress Notes (Signed)
PT PROFILE:  81 yo with COPD -(Gold II B FeV1 71% Fev1/FVC 58% 04/23/2012) 07/10/2014 Acute OV  Patient presents for an acute office visit to pulmonary clinic.  Patient complains of 3 days of cough, congestion, thick mucus, wheezing and dyspnea.  Wife says he has been very weak. She is having a hard time taking care of him.  Patient denies any hemoptysis, chest pain, orthopnea, PND or leg swelling Was changed to Stiolto last ov from Advair . Did not like Stiolto/hard to trigger>>. Back on Advair.  CXR today shows worsening RML /RLL consolidation (compared to 03/2014) ? obst mass  Appetite is fair .  Last admission was in October 2015. Will require admission for further treatment and evaluation.    SUBJ Fall yesterday without issue but abrasion, but now c/o left sided rib discomfort. No bruising noted.  Cough still present, but decreasing mucus.  Doesn't feel well.   OBJCeasar Mons Vitals:   07/12/14 2118 07/13/14 0655 07/13/14 1117 07/13/14 1450  BP: 163/87 158/65  150/63  Pulse: 80 98  80  Temp: 97.9 F (36.6 C) 98.1 F (36.7 C)  98.1 F (36.7 C)  TempSrc: Oral   Oral  Resp: 18 16  16   Height:      Weight:      SpO2: 98% 97% 96% 98%   Wd male in nad Nose without purulence or d/c noted. Neck without LN or TMG Chest with mildly diminished bs, no wheezing.  No bruising on chest wall.  Cor with rrr Abd soft, NT ND, BS+ LE with minimal edema, no cyanosis  IMPRESSION: 1) COPD with acute bronchospasm Pt feels he is better, but still with cough/purulence and some increased sob over baseline.  Will continue on aggressive treatment  2) Abnormal CT chest - RML findings appear chronic  RLL changes are c/w atypical infectious/inflammatory process - consider atypical mycobateria Unclear whether this represents an acute pna.  Will continue on abx for now  PLAN: Cont BD regimen Cont systemic steroids  Cont Levofloxacin Check rib detail films after fall

## 2014-07-14 LAB — EXPECTORATED SPUTUM ASSESSMENT W REFEX TO RESP CULTURE

## 2014-07-14 LAB — GLUCOSE, CAPILLARY: Glucose-Capillary: 120 mg/dL — ABNORMAL HIGH (ref 70–99)

## 2014-07-14 LAB — EXPECTORATED SPUTUM ASSESSMENT W GRAM STAIN, RFLX TO RESP C

## 2014-07-14 NOTE — Progress Notes (Addendum)
Patient found by student RN on floor after wife had tried to help him up to the bathroom.  Wife on floor with patient and had attempted to assist his fall.  She denies any injury. Patient's vital signs stable, no complaints of injury or pain. No acute findings on reassessment.  Dr. Sherene Sires notified.  Patient and wife were educated regarding the importance of leaving Bed and Chair alarms turned ON and calling staff for assistance when ambulating in the room or using the bathroom.  Previous shift staff stated that wife turned alarms off in attempt to assist patient herself.  We reminded her that these alarms are in place for her husband's safety and that staff is available to help him ambulate and use the bathroom. Patient and wife verbalized understanding. Camera turned on in room for patient safety and family notified.  Post Fall huddle completed with CN and other staff.  Safety Zone Portal submitted. Will continue to monitor closely.

## 2014-07-14 NOTE — Progress Notes (Signed)
Pt's bed alarm went off. Went to room to assist patient who was attempting to use urinal with assistance of wife.  After pt placed back in bed bed alarm was turned on and yellow non skid footies applied. Pt's wife stated that patient would remove footies, but I explained to her that this a necessary safety measure as he has already fallen and she agreed.

## 2014-07-14 NOTE — Progress Notes (Signed)
PCCM Progress Note  Admission date: 07/10/2014  Brief Summary: 81 yo male former smoker admitted from pulmonary office with cough, wheeze, dyspnea and RLL infiltrate.  Followed by Dr. Delford Field in pulmonary office.  Has hx of RA.  Studies: 1/28 CT chest >> RLL reticulonodular infiltrate ?infection vs inflammation  Subjective: Fell yesterday.  Denies chest pain.  Still has cough, but less sputum.  Objective: Blood pressure 154/86, pulse 60, temperature 98.5 F (36.9 C), temperature source Oral, resp. rate 16, height 5\' 7"  (1.702 m), weight 170 lb (77.111 kg), SpO2 93 %.  General: no distress HEENT: no sinus tenderness Cardiac: regular Chest: faint wheeze, no tenderness Abd: soft, non tender Ext: no edema Neuro: normal strength  CBC No results for input(s): WBC, HGB, HCT, PLT in the last 72 hours.  BMET No results for input(s): NA, K, CL, CO2, BUN, CREATININE, GLUCOSE in the last 72 hours.   Glucose Recent Labs     07/12/14  0802  07/13/14  0747  07/14/14  0800  GLUCAP  107*  114*  120*    Imaging Dg Ribs Unilateral Left  07/13/2014   CLINICAL DATA:  Fall onto left side.  Left chest and rib pain.  EXAM: LEFT RIBS - 2 VIEW  COMPARISON:  07/10/2014  FINDINGS: Old left posterior healed tenth rib fracture with callus formation.  No pneumothorax or pleural effusion. Thoracic spondylosis. There is slight irregularities along the costochondral junctions of the left seventh and eighth ribs which may represent nondisplaced fractures.  IMPRESSION: 1. Possible nondisplaced fractures along the left anterior seventh and eighth ribs or there is very slight cortical irregularity. There is subsegmental atelectasis at the left lung base but no pleural effusion or pneumothorax is observed. 2. Old healed left tenth rib fracture.   Electronically Signed   By: 07/12/2014 M.D.   On: 07/13/2014 17:21    A/P:  Acute on chronic respiratory failure 2nd to AECOPD, RLL infiltrate (?Infection vs  inflammatory). Plan: Continue BD's, pulmicort >> family concerned he can't use inhalers effectively as outpt Continue solumedrol 40 mg q12h Day 5/7 of levaquin Oxygen to keep SaO2 > 90% F/u CXR 2/02  Fall 1/31 >> possible Lt rib fx's. Deconditioning. Plan: Fall precautions PRN pain meds Bronchial hygiene PT/OT assessment  Hx of dementia. Plan: aricept Prn haldol  Hx of CAD, HTN, HLD. PVD. Plan: ASA, pravachol  Hx of rheumatoid arthritis. Plan: Hold arava for now  Updated pt's daughter at bedside.  09-12[birth year]1982, MD [facility] Pulmonary/Critical Care 07/14/2014, 1:40 PM Pager:  [phone] After 3pm call: [phone]

## 2014-07-14 NOTE — Progress Notes (Signed)
Patient not safe to be out of bed by himself.  My aid told me that she is pretty sure the wife has been turning off the bed alarm.  She also told another aid that the alarm bothers her.  This is a problem, since he has already fallen once here.  Continue to monitor.

## 2014-07-14 NOTE — Evaluation (Signed)
Physical Therapy Evaluation Patient Details Name: [name] [name] MRN: [id number] DOB: 16-Oct-1931 Today's Date: 07/14/2014   History of Present Illness  Patient is a 81 y/o male admitted from the pulmonary office with cough, wheeze, dyspnea and RLL infiltrate on 1/28. CT chest-RLL reticulonodular infiltrate ?infection vs inflammation. s/p fall x2 in hospital 1/28, 2/1. XRAY ribs- Possible nondisplaced fractures along the left anterior seventh and eighth ribs or there is very slight cortical irregularity.    Clinical Impression  Patient presents with functional limitations due to deficits listed in PT problem list (see below). Pt with generalized weakness, audible wheezing, dyspnea requiring supplemental 02 for activity and baseline cognitive deficits impacting safe mobility. Balance improved with use of RW however pt still requires Min guard assist for safety due to impaired safety awareness. Pt high falls risk due to above deficits and would benefit from skilled PT to improve balance and overall functional mobility so pt can maximize independence, minimize fall risk and ease burden of care prior to return home.    Follow Up Recommendations Home health PT;Supervision/Assistance - 24 hour    Equipment Recommendations  Rolling walker with 5" wheels    Recommendations for Other Services OT consult     Precautions / Restrictions Precautions Precautions: Fall Precaution Comments: monitor 02 Restrictions Weight Bearing Restrictions: No      Mobility  Bed Mobility Overal bed mobility: Needs Assistance Bed Mobility: Supine to Sit     Supine to sit: Min guard;HOB elevated     General bed mobility comments: Use of rails to get to EOB. Min guard for safety.  Transfers Overall transfer level: Needs assistance Equipment used: Rolling walker (2 wheeled) Transfers: Sit to/from Stand Sit to Stand: Min guard         General transfer comment: Min guard to rise from EOB x1, from chair x2  with cues for hand placement however pt reaching for walker handles despite cues.   Ambulation/Gait Ambulation/Gait assistance: Min assist Ambulation Distance (Feet): 100 Feet (x2 bouts) Assistive device: Rolling walker (2 wheeled) Gait Pattern/deviations: Step-through pattern;Decreased stride length;Trunk flexed   Gait velocity interpretation: Below normal speed for age/gender General Gait Details: VC's for RW proximity as pt with tendency to ambulate towards left side of walker. Difficulty with turns. Dyspnea present. Sa02 ranged from 90-95% on 3L 02 [place]. Wheezing noted. Knee instability on right towards end of gait.  Stairs            Wheelchair Mobility    Modified Rankin (Stroke Patients Only)       Balance Overall balance assessment: Needs assistance Sitting-balance support: Feet supported;No upper extremity supported Sitting balance-Leahy Scale: Fair     Standing balance support: During functional activity Standing balance-Leahy Scale: Poor                               Pertinent Vitals/Pain Pain Assessment: No/denies pain    Home Living Family/patient expects to be discharged to:: Private residence Living Arrangements: Spouse/significant other;Children (Daughter works during the day as Producer, television/film/video) Available Help at Discharge: Family;Available 24 hours/day Type of Home: House Home Access: Stairs to enter Entrance Stairs-Rails: Right Entrance Stairs-Number of Steps: 2 + 7, no rails on 2 steps, 7 with rails Home Layout: Two level Home Equipment: None      Prior Function Level of Independence: Independent;Needs assistance   Gait / Transfers Assistance Needed: Pt independent with ADLs, wife performs IADLs and drives. Family  reports fall down the steps (now has a gate blocking the stair entry).           Hand Dominance        Extremity/Trunk Assessment   Upper Extremity Assessment: Defer to OT evaluation           Lower Extremity  Assessment: Generalized weakness         Communication   Communication: No difficulties  Cognition Arousal/Alertness: Awake/alert Behavior During Therapy: WFL for tasks assessed/performed Overall Cognitive Status: History of cognitive impairments - at baseline (A&O x1. "looks like a shopping mall" Able to state "february" w/ cues for V-day.)       Memory: Decreased short-term memory              General Comments      Exercises        Assessment/Plan    PT Assessment Patient needs continued PT services  PT Diagnosis Difficulty walking;Generalized weakness   PT Problem List Decreased strength;Cardiopulmonary status limiting activity;Decreased cognition;Decreased knowledge of use of DME;Decreased balance;Decreased mobility;Decreased safety awareness  PT Treatment Interventions Balance training;Gait training;Stair training;DME instruction;Patient/family education;Therapeutic activities;Therapeutic exercise   PT Goals (Current goals can be found in the Care Plan section) Acute Rehab PT Goals Patient Stated Goal: none stated PT Goal Formulation: With patient Time For Goal Achievement: 07/28/14 Potential to Achieve Goals: Fair    Frequency Min 3X/week   Barriers to discharge        Co-evaluation               End of Session Equipment Utilized During Treatment: Gait belt;Oxygen Activity Tolerance: Patient tolerated treatment well Patient left: in chair;with call bell/phone within reach;with chair alarm set;with family/visitor present Nurse Communication: Mobility status         Time: 9643-8381 PT Time Calculation (min) (ACUTE ONLY): 29 min   Charges:   PT Evaluation $Initial PT Evaluation Tier I: 1 Procedure PT Treatments $Gait Training: 8-22 mins   PT G CodesAlvie Heidelberg A August 10, 2014, 4:41 PM Alvie Heidelberg, PT, DPT [phone]

## 2014-07-15 ENCOUNTER — Inpatient Hospital Stay (HOSPITAL_COMMUNITY): Payer: Medicare Other

## 2014-07-15 LAB — BASIC METABOLIC PANEL
ANION GAP: 4 — AB (ref 5–15)
BUN: 21 mg/dL (ref 6–23)
CO2: 32 mmol/L (ref 19–32)
Calcium: 8.3 mg/dL — ABNORMAL LOW (ref 8.4–10.5)
Chloride: 102 mmol/L (ref 96–112)
Creatinine, Ser: 1.16 mg/dL (ref 0.50–1.35)
GFR calc Af Amer: 67 mL/min — ABNORMAL LOW (ref >90)
GFR, EST NON AFRICAN AMERICAN: 58 mL/min — AB (ref >90)
Glucose, Bld: 119 mg/dL — ABNORMAL HIGH (ref 70–99)
POTASSIUM: 4.1 mmol/L (ref 3.5–5.1)
Sodium: 138 mmol/L (ref 135–145)

## 2014-07-15 LAB — CBC
HCT: 44.8 % (ref 39.0–52.0)
Hemoglobin: 14.6 g/dL (ref 13.0–17.0)
MCH: 28 pg (ref 26.0–34.0)
MCHC: 32.6 g/dL (ref 30.0–36.0)
MCV: 85.8 fL (ref 78.0–100.0)
Platelets: 220 10*3/uL (ref 150–400)
RBC: 5.22 MIL/uL (ref 4.22–5.81)
RDW: 14.1 % (ref 11.5–15.5)
WBC: 11.2 10*3/uL — AB (ref 4.0–10.5)

## 2014-07-15 LAB — GLUCOSE, CAPILLARY: Glucose-Capillary: 105 mg/dL — ABNORMAL HIGH (ref 70–99)

## 2014-07-15 MED ORDER — LEVOFLOXACIN 500 MG PO TABS
500.0000 mg | ORAL_TABLET | ORAL | Status: AC
  Administered 2014-07-15 – 2014-07-16 (×2): 500 mg via ORAL
  Filled 2014-07-15 (×4): qty 1

## 2014-07-15 MED ORDER — PREDNISONE 20 MG PO TABS
40.0000 mg | ORAL_TABLET | Freq: Every day | ORAL | Status: DC
  Administered 2014-07-16: 40 mg via ORAL
  Filled 2014-07-15 (×2): qty 2

## 2014-07-15 NOTE — Care Management Note (Addendum)
  Page 1 of 1   07/15/2014     2:46:58 PM CARE MANAGEMENT NOTE 07/15/2014  Patient:  [name] [name], [name] [name]   Account Number:  [id number]  Date Initiated:  07/11/2014  Documentation initiated by:  Carlyle Lipa  Subjective/Objective Assessment:   COPD exacerbation and increased consolidation on RML/RLL -not resolved from 03/2014     Action/Plan:   home with [facility]  follow up at the least   Anticipated DC Date:  07/17/2014   Anticipated DC Plan:  SKILLED NURSING FACILITY  In-house referral  Clinical Social Worker         Choice offered to / List presented to:             Status of service:  In process, will continue to follow Medicare Important Message given?  YES (If response is "NO", the following Medicare IM given date fields will be blank) Date Medicare IM given:  07/11/2014 Medicare IM given by:  Carlyle Lipa Date Additional Medicare IM given:  07/15/2014 Additional Medicare IM given by:  Ronny Flurry  Discharge Disposition:    Per UR Regulation:  Reviewed for med. necessity/level of care/duration of stay  If discussed at Long Length of Stay Meetings, dates discussed:   07/15/2014    Comments: 07-15-14 [name] [name] returned phone call , she is in agreement with short term SNF and she will be in patient's room in morning . Eric SW . Ronny Flurry RN BSN       07-15-14 PT changed recommendation to SNF . Left voice mail for [employer].  Wife not at bedside , called wife , left voice mail.   Ronny Flurry RN BSN

## 2014-07-15 NOTE — Care Management (Signed)
07-15-14 IM from Medicare given. Kadyn Guild RN BSN  

## 2014-07-15 NOTE — Evaluation (Signed)
Occupational Therapy Evaluation Patient Details Name: [name] [name] MRN: [id number] DOB: [birth year] Today's Date: 07/15/2014    History of Present Illness Patient is a 81 y/o male admitted from the pulmonary office with cough, wheeze, dyspnea and RLL infiltrate on 1/28. CT chest-RLL reticulonodular infiltrate ?infection vs inflammation. s/p fall x2 in hospital 1/28, 2/1. XRAY ribs- Possible nondisplaced fractures along the left anterior seventh and eighth ribs or there is very slight cortical irregularity.   Clinical Impression   Pt admitted with above. He demonstrates the below listed deficits and will benefit from continued OT to maximize safety and independence with BADLs.  Pt presents to OT with generalized weakness, impaired balance, and h/o cognitive impairment.  Currently, he requires min A for BADLs, and fatigues fairly quickly.   His 02 sats were 88% at rest on 2L supplemental 02.  02 increased to 3L with sats remaining in the mid 90s - RN notified.  HR 105.  Per RN, pt has had 2 falls with wife while in hospital.  Wife is not currently present.  Unsure if home with wife will be safest option given this info. And unsure if they have additional support at home.  If they don't have additional support, he may benefit from Short SNF stay prior to discharge home.       Follow Up Recommendations  Home health OT;Supervision/Assistance - 24 hour    Equipment Recommendations  3 in 1 bedside comode;Tub/shower bench    Recommendations for Other Services       Precautions / Restrictions Precautions Precautions: Fall Precaution Comments: monitor 02      Mobility Bed Mobility                  Transfers Overall transfer level: Needs assistance Equipment used: Rolling walker (2 wheeled) Transfers: Sit to/from [employer] Sit to Stand: Min guard Stand pivot transfers: Min guard       General transfer comment: min guard for safety, proper hand placement      Balance Overall balance assessment: Needs assistance Sitting-balance support: Feet supported Sitting balance-Leahy Scale: Fair     Standing balance support: Single extremity supported;During functional activity Standing balance-Leahy Scale: Fair                              ADL Overall ADL's : Needs assistance/impaired Eating/Feeding: Independent;Sitting   Grooming: Wash/dry hands;Wash/dry face;Oral care;Brushing hair;Min guard;Standing Grooming Details (indicate cue type and reason): min cues for sequencing  Upper Body Bathing: Set up;Supervision/ safety;Sitting   Lower Body Bathing: Sit to/from stand;Minimal assistance   Upper Body Dressing : Set up;Supervision/safety;Sitting   Lower Body Dressing: Minimal assistance;Sit to/from stand   Toilet Transfer: Min guard;Ambulation;Comfort height toilet;Grab bars;RW   Toileting- Clothing Manipulation and Hygiene: Minimal assistance;Sit to/from stand       Functional mobility during ADLs: Min guard;Rolling walker General ADL Comments: Pt requires min A for balance.      Vision                     Perception     Praxis      Pertinent Vitals/Pain Pain Assessment: No/denies pain     Hand Dominance     Extremity/Trunk Assessment Upper Extremity Assessment Upper Extremity Assessment: Generalized weakness   Lower Extremity Assessment Lower Extremity Assessment: Defer to PT evaluation       Communication Communication Communication: No difficulties   Cognition Arousal/Alertness: Awake/alert  Behavior During Therapy: WFL for tasks assessed/performed Overall Cognitive Status: History of cognitive impairments - at baseline                     General Comments       Exercises       Shoulder Instructions      Home Living Family/patient expects to be discharged to:: Private residence Living Arrangements: Spouse/significant other;Children (Daughter works during the day as Warehouse manager) Available Help at Discharge: Family;Available 24 hours/day Type of Home: House Home Access: Stairs to enter [employer] of Steps: 2 + 7, no rails on 2 steps, 7 with rails Entrance Stairs-Rails: Right Home Layout: Two level Alternate Level Stairs-Number of Steps: Pt only stays upstairs.             Home Equipment: None   Additional Comments: Pt unable to provide info re: bathroom set up and no family present       Prior Functioning/Environment Level of Independence: Independent;Needs assistance  Gait / Transfers Assistance Needed: Pt independent with ADLs, wife performs IADLs and drives. Family reports fall down the steps (now has a gate blocking the stair entry).          OT Diagnosis: Generalized weakness;Cognitive deficits   OT Problem List: Decreased strength;Decreased activity tolerance;Impaired balance (sitting and/or standing);Decreased cognition;Decreased safety awareness;Cardiopulmonary status limiting activity   OT Treatment/Interventions: Self-care/ADL training;Energy conservation;DME and/or AE instruction;Therapeutic activities;Cognitive remediation/compensation;Patient/family education;Balance training    OT Goals(Current goals can be found in the care plan section) Acute Rehab OT Goals Patient Stated Goal: none stated Time For Goal Achievement: 07/29/14 Potential to Achieve Goals: Good ADL Goals Pt Will Perform Grooming: with supervision;standing Pt Will Perform Lower Body Bathing: with supervision;sit to/from stand Pt Will Perform Lower Body Dressing: with supervision;sit to/from stand Pt Will Transfer to Toilet: with supervision;ambulating;grab bars;regular height toilet Pt Will Perform Toileting - Clothing Manipulation and hygiene: with supervision;sit to/from stand Pt Will Perform Tub/Shower Transfer: Tub transfer;Shower transfer;with min guard assist;ambulating;rolling walker  OT Frequency: Min 2X/week   Barriers to D/C: Decreased  caregiver support  unsure if pt has adequate support/assist at discharge        Co-evaluation              End of Session Equipment Utilized During Treatment: Rolling walker;Oxygen Nurse Communication: Mobility status  Activity Tolerance: Patient limited by fatigue Patient left: in chair;with call bell/phone within reach;with chair alarm set   Time: 1308-6578 OT Time Calculation (min): 32 min Charges:  OT General Charges $OT Visit: 1 Procedure OT Evaluation $Initial OT Evaluation Tier I: 1 Procedure OT Treatments $Self Care/Home Management : 8-22 mins G-Codes:    Rontrell Moquin M Jul 16, 2014, 12:29 PM

## 2014-07-15 NOTE — Clinical Social Work Note (Signed)
Received social work consult for patient to go to SNF for short term rehab.  Did not have time to assess patient, will complete in morning, patient will be faxed out for bed offers and FL2 to be completed.  Ervin Knack. Laurelai Lepp, MSW, Theresia Majors [phone] 07/15/2014 5:27 PM

## 2014-07-15 NOTE — Progress Notes (Signed)
PCCM Progress Note  Admission date: 07/10/2014  Brief Summary: 81 yo male former smoker admitted from pulmonary office with cough, wheeze, dyspnea and RLL infiltrate.  Followed by Dr. Delford Field in pulmonary office.  Has hx of RA.  Studies: 1/28 CT chest >> RLL reticulonodular infiltrate ?infection vs inflammation 1/31 fall   Subjective: Breathing better  Denies chest pain.  Still has cough, but less sputum.  Objective: Blood pressure 154/86, pulse 60, temperature 98.5 F (36.9 C), temperature source Oral, resp. rate 16, height 5\' 7"  (1.702 m), weight 170 lb (77.111 kg), SpO2 93 %.  General: no distress HEENT: no sinus tenderness Cardiac: regular Chest: faint wheeze, no tenderness Abd: soft, non tender Ext: no edema Neuro: normal strength  CBC Recent Labs     07/15/14  0613  WBC  11.2*  HGB  14.6  HCT  44.8  PLT  220    BMET Recent Labs     07/15/14  0613  NA  138  K  4.1  CL  102  CO2  32  BUN  21  CREATININE  1.16  GLUCOSE  119*     Glucose Recent Labs     07/13/14  0747  07/14/14  0800  07/15/14  0756  GLUCAP  114*  120*  105*    Imaging Dg Chest 2 View  07/15/2014   CLINICAL DATA:  Shortness of Breath  EXAM: CHEST  2 VIEW  COMPARISON:  Chest radiograph and chest CT July 10, 2014  FINDINGS: There is underlying emphysema. There has been partial clearing of right middle lobe airspace consolidation compared to recent examinations. Some patchy opacity remains in this area. Left lung is clear. Heart is mildly enlarged with pulmonary vascularity reflecting underlying emphysema. No adenopathy there is degenerative change in the thoracic spine.  IMPRESSION: Partial but incomplete clearing of right middle lobe infiltrate. No new opacity. Underlying emphysema. No change in cardiac silhouette.   Electronically Signed   By: July 12, 2014 M.D.   On: 07/15/2014 07:51   Dg Ribs Unilateral Left  07/13/2014   CLINICAL DATA:  Fall onto left side.  Left chest and rib  pain.  EXAM: LEFT RIBS - 2 VIEW  COMPARISON:  07/10/2014  FINDINGS: Old left posterior healed tenth rib fracture with callus formation.  No pneumothorax or pleural effusion. Thoracic spondylosis. There is slight irregularities along the costochondral junctions of the left seventh and eighth ribs which may represent nondisplaced fractures.  IMPRESSION: 1. Possible nondisplaced fractures along the left anterior seventh and eighth ribs or there is very slight cortical irregularity. There is subsegmental atelectasis at the left lung base but no pleural effusion or pneumothorax is observed. 2. Old healed left tenth rib fracture.   Electronically Signed   By: 07/12/2014 M.D.   On: 07/13/2014 17:21    A/P:  Acute on chronic respiratory failure 2nd to AECOPD, RLL CAP - improved Plan: Continue BD's, pulmicort >> family concerned he can't use inhalers effectively as outpt, Needs neb set up Continue solumedrol 40 mg q24h -pred in 24h Day 6/7 of levaquin Oxygen to keep SaO2 > 90%   Fall 1/31 >> possible Lt rib fx's. Deconditioning. Plan: Fall precautions PRN pain meds Bronchial hygiene PT/OT assessment  Hx of dementia. Plan: aricept Prn haldol  Hx of CAD, HTN, HLD. PVD. Plan: ASA, pravachol  Hx of rheumatoid arthritis. Plan: Hold arava for now  Updated wife at bedside.  Close to dc, based on PT assessment  Lamanda Rudder V.  MD  07/15/2014, 11:14 AM

## 2014-07-15 NOTE — Progress Notes (Signed)
Physical Therapy Treatment Patient Details Name: [name] [name] MRN: [id number] DOB: 03/19/1932 Today's Date: 07/15/2014    History of Present Illness Patient is a 81 y/o male admitted from the pulmonary office with cough, wheeze, dyspnea and RLL infiltrate on 1/28. CT chest-RLL reticulonodular infiltrate ?infection vs inflammation. s/p fall x2 in hospital 1/28, 2/1. XRAY ribs- Possible nondisplaced fractures along the left anterior seventh and eighth ribs or there is very slight cortical irregularity.    PT Comments    Patient progressing slowly with mobility. Visual impairment noted today as pt with difficulty seeing large objects in front of him (running straight into bed when asked to head towards door, not able to see chair in front of him prior to sitting). Continues to have increased dyspnea with exertion, esp noted during stair training. Able to maintain Sa02 >93% during gait training on RA. Concerned about pt returning home due to poor safety awareness and need for increased support at home. Discharge recommendation updated to [facility].    Follow Up Recommendations  SNF;Supervision/Assistance - 24 hour     Equipment Recommendations  Rolling walker with 5" wheels    Recommendations for Other Services       Precautions / Restrictions Precautions Precautions: Fall Precaution Comments: monitor 02 Restrictions Weight Bearing Restrictions: No    Mobility  Bed Mobility               General bed mobility comments: Received sitting in chair upon PT arrival.  Transfers Overall transfer level: Needs assistance Equipment used: Rolling walker (2 wheeled) Transfers: Sit to/from Stand Sit to Stand: Min guard Stand pivot transfers: Min guard       General transfer comment: min guard for safety, cues for proper hand placement. Stood from Landscape architect. Impaired vision noted today, "have a seat" with chair pulled right next to pt and pt reports, "where?" Difficulty seeing chair to  sit in and bed to navigate around.  Ambulation/Gait Ambulation/Gait assistance: Min assist Ambulation Distance (Feet): 130 Feet (+40') Assistive device: Rolling walker (2 wheeled) Gait Pattern/deviations: Step-through pattern;Decreased stride length;Trunk flexed;Drifts right/left     General Gait Details: Pt with slow, unsteady gait. Veering left at times into wall. Poor vision noted today with pt running into bed and not able to read signs. Dyspnea present. Sa02 >92% on RA. 1 long seated rest break after stair negotiation.   Stairs Stairs: Yes Stairs assistance: Min assist Stair Management: One rail Right;Step to pattern;Alternating pattern;Forwards Number of Stairs: 7 General stair comments: Min A to ascend/descend steps for balance/stability. Dyspnea and wheezing noted. Increased difficulty. Not able to obtain SA02 due to poor circulation in fingers, anticipate Sa02 decreased due to symptoms.  Wheelchair Mobility    Modified Rankin (Stroke Patients Only)       Balance Overall balance assessment: Needs assistance Sitting-balance support: Feet supported;No upper extremity supported Sitting balance-Leahy Scale: Fair     Standing balance support: During functional activity Standing balance-Leahy Scale: Poor                      Cognition Arousal/Alertness: Awake/alert Behavior During Therapy: WFL for tasks assessed/performed Overall Cognitive Status: History of cognitive impairments - at baseline                      Exercises      General Comments General comments (skin integrity, edema, etc.): 02 sats at rest 88% on 2L. 02 increased to 3L and pt instructed in  pursed lip breathing with sats increasing to 95%.  Sats at 94% at end of session with HR 105      Pertinent Vitals/Pain Pain Assessment: No/denies pain    Home Living Family/patient expects to be discharged to:: Private residence Living Arrangements: Spouse/significant other;Children  (Daughter works during the day as Producer, television/film/video) Available Help at Discharge: Family;Available 24 hours/day Type of Home: House Home Access: Stairs to enter Entrance Stairs-Rails: Right Home Layout: Two level Home Equipment: None Additional Comments: Pt unable to provide info re: bathroom set up and no family present     Prior Function Level of Independence: Independent;Needs assistance  Gait / Transfers Assistance Needed: Pt independent with ADLs, wife performs IADLs and drives. Family reports fall down the steps (now has a gate blocking the stair entry).       PT Goals (current goals can now be found in the care plan section) Acute Rehab PT Goals Patient Stated Goal: none stated Progress towards PT goals: Progressing toward goals    Frequency  Min 3X/week    PT Plan Discharge plan needs to be updated    Co-evaluation             End of Session Equipment Utilized During Treatment: Gait belt Activity Tolerance: Patient tolerated treatment well Patient left: in chair;with call bell/phone within reach;with chair alarm set     Time: 1400-1427 PT Time Calculation (min) (ACUTE ONLY): 27 min  Charges:  $Gait Training: 23-37 mins                    G CodesAlvie Heidelberg A Jul 29, 2014, 2:50 PM  Alvie Heidelberg, PT, DPT [phone]

## 2014-07-16 LAB — GLUCOSE, CAPILLARY: GLUCOSE-CAPILLARY: 100 mg/dL — AB (ref 70–99)

## 2014-07-16 MED ORDER — PREDNISONE 20 MG PO TABS: ORAL_TABLET | ORAL | Status: DC

## 2014-07-16 MED ORDER — BUDESONIDE 0.25 MG/2ML IN SUSP: 0.2500 mg | Freq: Two times a day (BID) | RESPIRATORY_TRACT | Status: DC

## 2014-07-16 MED ORDER — GUAIFENESIN ER 600 MG PO TB12: 600.0000 mg | ORAL_TABLET | Freq: Two times a day (BID) | ORAL | Status: AC

## 2014-07-16 MED ORDER — IPRATROPIUM-ALBUTEROL 0.5-2.5 (3) MG/3ML IN SOLN: 3.0000 mL | Freq: Four times a day (QID) | RESPIRATORY_TRACT | Status: DC

## 2014-07-16 MED ORDER — ALBUTEROL SULFATE (2.5 MG/3ML) 0.083% IN NEBU: 2.5000 mg | INHALATION_SOLUTION | RESPIRATORY_TRACT | Status: AC | PRN

## 2014-07-16 NOTE — Clinical Social Work Psychosocial (Signed)
Clinical Social Work Department BRIEF PSYCHOSOCIAL ASSESSMENT 07/16/2014  Patient:  [name] [name], [name] [name]     Account Number:  [id number]     Admit date:  07/10/2014  Clinical Social Worker:  Elouise Munroe  Date/Time:  07/16/2014 11:28 AM  Referred by:  Care Management  Date Referred:  07/15/2014 Referred for  SNF Placement   Other Referral:   Interview type:  Family Other interview type:    PSYCHOSOCIAL DATA Living Status:  FAMILY Admitted from facility:   Level of care:   Primary support name:  Environmental manager Primary support relationship to patient:  SPOUSE Degree of support available:   Patient's wife is the primary caregiver for patient    CURRENT CONCERNS Current Concerns  Post-Acute Placement   Other Concerns:    SOCIAL WORK ASSESSMENT / PLAN Patient is a 81 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  Patient lives with his wife who is the primary caregiver.  Patient did not respond  to CSW interactions, information for assessment was gathered from patient's wife.  Patient's wife pleasant and talkative, she expressed understanding of the benefit of going to SNF for short term rehab, and is in agreement for patient go to SNF for short term rehab. Patient will discharge to SNF once he is medically ready and discharge orders have been received.   Assessment/plan status:  Psychosocial Support/Ongoing Assessment of Needs Other assessment/ plan:   Information/referral to community resources:    PATIENT'S/FAMILY'S RESPONSE TO PLAN OF CARE: Patient and family in agreement to going to SNF for short term rehab to increase his strength.    Ervin Knack. Rolene Andrades, MSW, LCSWA [phone] 07/16/2014 11:56 AM

## 2014-07-16 NOTE — Clinical Social Work Note (Signed)
Presented bed offers to patient's wife Nelva Bush, and she chooses [facility].  Contacted [facility] to confirm bed offer, [facility] will be able to accept patient once she is medically ready and discharge orders have been received.  Ervin Knack. Lachlyn Vanderstelt, MSW, Theresia Majors [phone] 07/16/2014 10:58 AM

## 2014-07-16 NOTE — Clinical Social Work Note (Signed)
Patient to be d/c'ed today to [facility].  Patient and family agreeable to plans will transport via ems RN to call report.  Jaquarius Seder, MSW, LCSWA [phone]  

## 2014-07-16 NOTE — Clinical Social Work Placement (Addendum)
Clinical Social Work Department CLINICAL SOCIAL WORK PLACEMENT NOTE 07/16/2014  Patient:  [name] [name], [name] [name]  Account Number:  [id number] Admit date:  07/10/2014  Clinical Social Worker:  Lakie Mclouth, LCSWA  Date/time:  07/16/2014 11:57 AM  Clinical Social Work is seeking post-discharge placement for this patient at the following level of care:   SKILLED NURSING   (*CSW will update this form in Epic as items are completed)   07/16/2014  Patient/family provided with [facility] Department of Clinical Social Work's list of facilities offering this level of care within the geographic area requested by the patient (or if unable, by the patient's family).  07/16/2014  Patient/family informed of their freedom to choose among providers that offer the needed level of care, that participate in Medicare, Medicaid or managed care program needed by the patient, have an available bed and are willing to accept the patient.  07/16/2014  Patient/family informed of [facility]' ownership interest in [facility], as well as of the fact that they are under no obligation to receive care at this facility.  PASARR submitted to EDS on 07/16/2014 PASARR number received on 07/16/2014  FL2 transmitted to all facilities in geographic area requested by pt/family on  07/16/2014 FL2 transmitted to all facilities within larger geographic area on 07/16/2014  Patient informed that his/her managed care company has contracts with or will negotiate with  certain facilities, including the following:     Patient/family informed of bed offers received:  07/16/2014 Patient chooses bed at [facility] Physician recommends and patient chooses bed at    Patient to be transferred to [facility] on 07/16/14    Patient to be transferred to facility by [facility] Patient and family notified of transfer on 07/16/14 Name of family member notified: Left message with patient's grandson to give to patient's  wife [name] [name].  The following physician request were entered in Epic:   Additional Comments:   Anavey Coombes R. Staphanie Harbison, MSW, LCSWA [phone] 07/16/2014 11:59 AM

## 2014-07-16 NOTE — Discharge Summary (Signed)
Physician Discharge Summary  Patient ID: BRADDEN TADROS MRN: [id number] DOB/AGE: 14-Apr-1932 81 y.o.  Admit date: 07/10/2014 Discharge date: 07/16/2014    Discharge Diagnoses:  Acute on chronic respiratory failure 2nd to AECOPD, RLL CAP - improved Deconditioning Dementia Mechanical Falls Hx of CAD, HTN, HLD, PVD Hx of rheumatoid arthritis. Dementia                                                                       DISCHARGE PLAN BY DIAGNOSIS    Acute on chronic respiratory failure 2nd to AECOPD, RLL CAP - improved  Discharge Plan: O2 at 3L per [place].  Wean to off for saturations 88-94% PT efforts / mobilize  Continue BD's (duoneb) + pulmicort >> family concerned that pt can't use inhalers effectively as outpt due to weakness PRN albuterol HFA or Neb Will need to ensure nebulizer machine arranged for home prior to d/c from SNF.   Prednisone with taper to off S/p 7 days of Levaquin. Guaifenesin x 7 days then d/c  Follow up sputum culture results  Follow up CXR to ensure clearance of RLL changes  Deconditioning Dementia Mechanical Falls  Discharge Plan: Fall precautions.  Continue aggressive PT Avoid sedating medications as able  Continue outpatient aricept.    Hx of CAD, HTN, HLD, PVD  Discharge Plan: Continue outpatient ASA, Pravachol.   Hx of rheumatoid arthritis. Deconditioning  Discharge Plan: Continue outpatient [facility].  Continue PT / OT at SNF Rolling walker for ambulation                  DISCHARGE SUMMARY   JORON VELIS is a 81 y.o. y/o male with a PMH of PVD, OA, HTN, HLD, CAD, COPD, Colon Polyps, Dementia, Back Pain, Rheumatoid Arthritis.  He sees Dr. Joya Gaskins as outpatient for management of his COPD (Gold II B, FEV1 71% and ratio 58% from PFT's 04/23/2012).  He was seen in the office on 1/28 by Dr. Melvyn Novas for an acute visit due to 3 days of cough, congestion, thick mucus, wheezing, dyspnea.  Per his wife, he had been very weak and she was having a  hard time taking care of him at home.   On prior office visit, he had been changed from [place] to [facility]; however, had not ben taking the Stiolto because he had a hard time triggering it, therefore, went back to Advair.  Initial CXR demonstrated worsening RML / RLL consolidation in comparison to CXR from October 2015.  There was question of an obstructing mass.  Due to his severity of symptoms, he was sent to [facility] for admission / further evaluation and treatment.  He was treated with O2, nebulized BD's, IV steroids, and IV Levaquin. CT of the chest on 1/28 showed RML changes that appeared chronic and RLL reticulonodular infiltrate (inflammation vs infection).  This was thought to be related to acute PNA but will be followed radiographically to ensure resolution.   During his hospitalization, he unfortunately suffered 2 mechanical falls.  Nursing staff reported that his wife had supposedly been turning him in bed and chair alarms off so that she can assist him to get up.  Staff repeatedly had to remind pt and his wife that alarms were in  place for his own safety and that it was important not to turn any alarms off especially since he had already suffered falls.  They verbalized understanding.  Evaluation post fall of chest film noted possible non-displaced fracture along the L anterior 7th/8th ribs with old healed L 10th rib fx.  The patient made slow improvement.  He was evaluated by PT / OT and recommended for SNF rehab efforts.  On 2/3, he was deemed medically stable and cleared for discharge to SNF.            SIGNIFICANT DIAGNOSTIC STUDIES CT Chest 1/28 >>> RLL reticulonodular infiltrate, ? Infection vs inflammation Ribs 1/31 >>> possible non-displaced fx along left anterior 7th and 8th ribs, old healed left 10th rib fx. CXR 2/2 >>> partial but incomplete clearing of right MLL infiltrate, no new opacity.  SIGNIFICANT EVENTS 1/28 - Admitted 1/31 - Pt had fall and sustained a few  abrasions.  Did not hit head and no change in mentation. Rib Xrays revealed possible anterior 7th & 8th rib fx's. 2/01 - Pt had another fall, wife reportedly turning bed and chair alarms off so that pt can get up.  No acute injuries. 2/03 - Discharged to SNF  MICRO DATA  2/1 Sputum >>>  ANTIBIOTICS Levaquin 1/28 >>>  2/3    Discharge Exam: General: wdwn elderly male in NAD  Neuro: AAOx4, speech clear, MAE  HEENT: mm pink/moist, no jvd  Cardiovascular:  s1s2 rrr, no m/r/g  Lungs: resp's even/non-labored, lungs bilaterally clear without wheeze  Abdomen: NTND, bsx4 active  Musculoskeletal: no acute deformities Skin: warm/dry, no edema  Filed Vitals:   07/15/14 2059 07/16/14 0644 07/16/14 1325 07/16/14 1339  BP: 162/96 152/92 133/86   Pulse: 81 66 76   Temp: 98.4 F (36.9 C) 98.5 F (36.9 C) 98.4 F (36.9 C)   TempSrc: Oral  Oral   Resp: $Remo'17 16 16   'JMLgm$ Height:      Weight:      SpO2: 94% 100% 97% 100%    Discharge Labs  BMET  Recent Labs Lab 07/10/14 1445 07/11/14 0712 07/15/14 0613  NA 135 137 138  K 3.5 4.1 4.1  CL 99 102 102  CO2 29 27 32  GLUCOSE 130* 182* 119*  BUN $Re'8 9 21  'svT$ CREATININE 1.09 1.11 1.16  CALCIUM 8.2* 8.7 8.3*    CBC  Recent Labs Lab 07/10/14 1445 07/11/14 0712 07/15/14 0613  HGB 13.1 13.9 14.6  HCT 39.8 42.0 44.8  WBC 9.3 6.2 11.2*  PLT 174 202 220    Discharge Instructions    Call MD for:  difficulty breathing, headache or visual disturbances    Complete by:  As directed      Call MD for:  extreme fatigue    Complete by:  As directed      Call MD for:  hives    Complete by:  As directed      Call MD for:  persistant dizziness or light-headedness    Complete by:  As directed      Call MD for:  persistant nausea and vomiting    Complete by:  As directed      Call MD for:  severe uncontrolled pain    Complete by:  As directed      Call MD for:  temperature >100.4    Complete by:  As directed      Diet - low sodium heart  healthy    Complete by:  As  directed      Discharge instructions    Complete by:  As directed   1.  Review your medications carefully as they have changed. 2.  Follow up with [facility] as scheduled 3.  Continue to wear oxygen at 3L per nasal cannula     Increase activity slowly    Complete by:  As directed                 Follow-up Information    Follow up with PARRETT,TAMMY, NP On 07/29/2014.   Specialty:  Nurse Practitioner   Why:  Your appointment is at 3:00PM   Contact information:   [address] [place] [address] [phone]          Medication List    STOP taking these medications        Fluticasone-Salmeterol 250-50 MCG/DOSE Aepb  Commonly known as:  ADVAIR DISKUS      TAKE these medications        acetaminophen 500 MG tablet  Commonly known as:  TYLENOL  Take 1,000 mg by mouth daily as needed for mild pain or headache.     aspirin 325 MG EC tablet  Take 325 mg by mouth daily.     b complex vitamins tablet  Take 1 tablet by mouth daily.     budesonide 0.25 MG/2ML nebulizer solution  Commonly known as:  PULMICORT  Take 2 mLs (0.25 mg total) by nebulization 2 (two) times daily.     cholecalciferol 1000 UNITS tablet  Commonly known as:  VITAMIN D  Take 1,000 Units by mouth daily.     donepezil 10 MG tablet  Commonly known as:  ARICEPT  Take 20 mg by mouth at bedtime.     guaiFENesin 600 MG 12 hr tablet  Commonly known as:  MUCINEX  Take 1 tablet (600 mg total) by mouth 2 (two) times daily.     ipratropium-albuterol 0.5-2.5 (3) MG/3ML Soln  Commonly known as:  DUONEB  Take 3 mLs by nebulization every 6 (six) hours.     leflunomide 20 MG tablet  Commonly known as:  ARAVA  Take 20 mg by mouth daily.     nitroGLYCERIN 0.4 MG SL tablet  Commonly known as:  NITROSTAT  Place 0.4 mg under the tongue every 5 (five) minutes x 3 doses as needed. For chest pain.     PEPCID 20 MG tablet  Generic drug:  famotidine  Take 20 mg by  mouth as needed for heartburn.     pravastatin 40 MG tablet  Commonly known as:  PRAVACHOL  take 1 tablet by mouth at bedtime     predniSONE 20 MG tablet  Commonly known as:  DELTASONE  2 tabs daily for 2 days, then 1 tab daily for 2 days, then 1/2 tab daily for 2 days, then stop     PROAIR HFA 108 (90 BASE) MCG/ACT inhaler  Generic drug:  albuterol  inhale 2 puffs by mouth every 4 hours if needed for wheezing     albuterol (2.5 MG/3ML) 0.083% nebulizer solution  Commonly known as:  PROVENTIL  Take 3 mLs (2.5 mg total) by nebulization every 4 (four) hours as needed for wheezing.     traZODone 50 MG tablet  Commonly known as:  DESYREL  Take 50 mg by mouth at bedtime.          Disposition: SNF.  Discharged Condition: Clayvon Parlett Pargas has met maximum benefit of inpatient care and is  medically stable and cleared for discharge.  Patient is pending follow up as above.      Time spent on disposition:  Greater than 45 minutes.   Physician Statement:   The Patient was personally examined, the discharge assessment and plan has been personally reviewed and I agree with ACNP Alfredo Martinez' assessment and plan. > 30 minutes of time have been dedicated to discharge assessment, planning and discharge instructions.   He stable  for discharge to rehabilitation facility today. His wife is concerned that he is lost the will to live - I do believe that advance directives should be discussed some more given his declining health.  Rigoberto Noel MD

## 2014-07-16 NOTE — Progress Notes (Signed)
Report was given to [name] [name] at [facility]. Also called the number for pt's wife [name] [name] but she was not home so told pt's grandson that pt was being taken to [facility].

## 2014-07-17 LAB — CULTURE, RESPIRATORY

## 2014-07-17 LAB — CULTURE, RESPIRATORY W GRAM STAIN: Culture: NORMAL

## 2014-07-29 ENCOUNTER — Ambulatory Visit (INDEPENDENT_AMBULATORY_CARE_PROVIDER_SITE_OTHER): Payer: Medicare Other | Admitting: Adult Health

## 2014-07-29 ENCOUNTER — Ambulatory Visit (INDEPENDENT_AMBULATORY_CARE_PROVIDER_SITE_OTHER)
Admission: RE | Admit: 2014-07-29 | Discharge: 2014-07-29 | Disposition: A | Discharge status: Home / Self Care | Payer: Medicare Other | Source: Ambulatory Visit | Attending: Adult Health | Admitting: Adult Health

## 2014-07-29 ENCOUNTER — Encounter: Payer: Self-pay | Admitting: Adult Health

## 2014-07-29 VITALS — BP 126/74 | HR 82 | Temp 98.1°F | Ht 66.0 in | Wt 174.6 lb

## 2014-07-29 DIAGNOSIS — J189 Pneumonia, unspecified organism: Secondary | ICD-10-CM

## 2014-07-29 DIAGNOSIS — J441 Chronic obstructive pulmonary disease with (acute) exacerbation: Secondary | ICD-10-CM

## 2014-07-29 MED ORDER — FUROSEMIDE 20 MG PO TABS: 20.0000 mg | ORAL_TABLET | Freq: Every day | ORAL | Status: DC

## 2014-07-29 NOTE — Assessment & Plan Note (Signed)
Recent flare now resolving -does appear to have some edema , no overt acute CHF   Plan  Use Lasix 20mg  daily for 2 days  Continue on current regimen  Follow up Dr.  In 6-8 weeks and As needed   Please contact office for sooner follow up if symptoms do not improve or worsen or seek emergency care

## 2014-07-29 NOTE — Assessment & Plan Note (Signed)
Clinicailly improving  No further abx at this time   Plan  follow up 6 weeks with cxr

## 2014-07-29 NOTE — Patient Instructions (Addendum)
Use Lasix 20mg  daily for 2 days  Continue on current regimen  Follow up Dr.  In 6-8 weeks with chest xray  and As needed   Please contact office for sooner follow up if symptoms do not improve or worsen or seek emergency care

## 2014-07-29 NOTE — Progress Notes (Signed)
Subjective:    Patient ID: [name] [name], male    DOB: 05-08-1932, 81 y.o.   MRN: [id number]  HPI.  81 year old male with COPD and hypoxic respiratory failure  07/29/2014 Post Hospital follow up  Patient returns for a post hospital follow-up. He was admitted January 28 through February 3 for COPD exacerbation and pneumonia. He was treated with aggressive IV antibiotics, steroids and nebulized bronchodilators. CT chest showed right middle lobe changes that appeared chronic and right lower lobe reticular nodular infiltrate Unfortunate patient did have 2 falls. During his hospitalization. With a possible nondisplaced fracture along the seventh and eighth ribs.  He was evaluated by PT and OT and recommended for rehabilitation. He was discharged to a SNF. Since discharge. Patient is feeling is feeling better. Decreased Dyspnea.  He denies any hemoptysis, orthopnea, PND, or increased leg swelling Chest x-ray today shows persistent bibasilar infiltrates .  Ankles have been more swollen.      Review of Systems Constitutional:   No  weight loss, night sweats,  Fevers, chills,  +fatigue, lassitude. HEENT:   No headaches,  Difficulty swallowing,  Tooth/dental problems,  Sore throat,                No sneezing, itching, ear ache, nasal congestion, post nasal drip,   CV:  No chest pain,  Orthopnea, PND, swelling in lower extremities, anasarca, dizziness, palpitations  GI  No heartburn, indigestion, abdominal pain, nausea, vomiting, diarrhea, change in bowel habits, loss of appetite  Resp:    No chest wall deformity  Skin: no rash or lesions.  GU: no dysuria, change in color of urine, no urgency or frequency.  No flank pain.  MS:  No joint pain or swelling.  No decreased range of motion.  No back pain.  Psych:  No change in mood or affect. No depression or anxiety.  + memory loss.     Objective:   Physical Exam Filed Vitals:   07/29/14 1523  BP: 126/74  Pulse: 82  Temp: 98.1 F  (36.7 C)  TempSrc: Oral  Height: 5\' 6"  (1.676 m)  Weight: 174 lb 9.6 oz (79.198 kg)  SpO2: 93%    Gen: Pleasant, elderly , in no distress,  normal affect, wheelchair , chronically ill appeairng.   ENT: No lesions,  mouth clear,  oropharynx clear, no postnasal drip  Neck: No JVD, no TMG, no carotid bruits  Lungs: No use of accessory muscles, no dullness to percussion, trace rhonchi   Cardiovascular: RRR, heart sounds normal, no murmur or gallops, 1+ peripheral edema  Abdomen: soft and NT, no HSM,  BS normal  Musculoskeletal: No deformities, no cyanosis or clubbing  Neuro: alert, non focal  Skin: Warm, no lesions or rashes  Dg Chest 2 View  07/29/2014   CLINICAL DATA:  Pneumonia.  Cough.  EXAM: CHEST  2 VIEW  COMPARISON:  None.  FINDINGS: Mediastinum and hilar structures normal. Bibasilar pulmonary infiltrates. Basilar pleural parenchymal thickening consistent with scarring. Small pleural effusions cannot be excluded. No pneumothorax. Heart size normal. No acute bony abnormality.  IMPRESSION: 1.  Bibasilar infiltrates consistent with pneumonia.  2.  Bibasilar atelectasis and scarring.   Electronically Signed   By: 07/31/2014  Register   On: 07/29/2014 15:29  reviewed independently         Assessment & Plan:   No problem-specific assessment & plan notes found for this encounter.   Updated Medication List Outpatient Encounter Prescriptions as of 07/29/2014  Medication Sig  .  acetaminophen (TYLENOL) 500 MG tablet Take 1,000 mg by mouth daily as needed for mild pain or headache.  . albuterol (PROVENTIL) (2.5 MG/3ML) 0.083% nebulizer solution Take 3 mLs (2.5 mg total) by nebulization every 4 (four) hours as needed for wheezing.  Marland Kitchen aspirin 325 MG EC tablet Take 325 mg by mouth daily.    Marland Kitchen b complex vitamins tablet Take 1 tablet by mouth daily.  . budesonide (PULMICORT) 0.25 MG/2ML nebulizer solution Take 2 mLs (0.25 mg total) by nebulization 2 (two) times daily.  . cholecalciferol  (VITAMIN D) 1000 UNITS tablet Take 1,000 Units by mouth daily.    . divalproex (DEPAKOTE SPRINKLE) 125 MG capsule Take 125 mg by mouth 2 (two) times daily.  Marland Kitchen donepezil (ARICEPT) 10 MG tablet Take 20 mg by mouth at bedtime.   . famotidine (PEPCID) 20 MG tablet Take 20 mg by mouth as needed for heartburn.   Marland Kitchen guaiFENesin (MUCINEX) 600 MG 12 hr tablet Take 600 mg by mouth 2 (two) times daily.  Marland Kitchen ipratropium-albuterol (DUONEB) 0.5-2.5 (3) MG/3ML SOLN Take 3 mLs by nebulization every 6 (six) hours.  Marland Kitchen leflunomide (ARAVA) 20 MG tablet Take 20 mg by mouth daily.  Marland Kitchen LORazepam (ATIVAN) 0.5 MG tablet Take 0.5 mg by mouth 2 (two) times daily as needed for anxiety.  . nitroGLYCERIN (NITROSTAT) 0.4 MG SL tablet Place 0.4 mg under the tongue every 5 (five) minutes x 3 doses as needed. For chest pain.  . pravastatin (PRAVACHOL) 40 MG tablet take 1 tablet by mouth at bedtime  . PROAIR HFA 108 (90 BASE) MCG/ACT inhaler inhale 2 puffs by mouth every 4 hours if needed for wheezing  . traZODone (DESYREL) 50 MG tablet Take 50 mg by mouth at bedtime.   . furosemide (LASIX) 20 MG tablet Take 1 tablet (20 mg total) by mouth daily.  . [DISCONTINUED] predniSONE (DELTASONE) 20 MG tablet 2 tabs daily for 2 days, then 1 tab daily for 2 days, then 1/2 tab daily for 2 days, then stop (Patient not taking: Reported on 07/29/2014)

## 2014-08-09 ENCOUNTER — Emergency Department (HOSPITAL_COMMUNITY): Payer: Medicare Other

## 2014-08-09 ENCOUNTER — Encounter (HOSPITAL_COMMUNITY): Payer: Self-pay

## 2014-08-09 ENCOUNTER — Inpatient Hospital Stay (HOSPITAL_COMMUNITY)
Admission: EM | Admit: 2014-08-09 | Discharge: 2014-08-19 | DRG: 193 | Disposition: A | Discharge status: Skilled Nursing Facility | Payer: Medicare Other | Attending: Internal Medicine | Admitting: Internal Medicine

## 2014-08-09 DIAGNOSIS — J9621 Acute and chronic respiratory failure with hypoxia: Secondary | ICD-10-CM | POA: Diagnosis present

## 2014-08-09 DIAGNOSIS — Z930 Tracheostomy status: Secondary | ICD-10-CM | POA: Diagnosis not present

## 2014-08-09 DIAGNOSIS — J9601 Acute respiratory failure with hypoxia: Secondary | ICD-10-CM | POA: Diagnosis present

## 2014-08-09 DIAGNOSIS — Z880 Allergy status to penicillin: Secondary | ICD-10-CM | POA: Diagnosis not present

## 2014-08-09 DIAGNOSIS — J189 Pneumonia, unspecified organism: Secondary | ICD-10-CM | POA: Diagnosis present

## 2014-08-09 DIAGNOSIS — T380X5A Adverse effect of glucocorticoids and synthetic analogues, initial encounter: Secondary | ICD-10-CM | POA: Diagnosis present

## 2014-08-09 DIAGNOSIS — Z888 Allergy status to other drugs, medicaments and biological substances status: Secondary | ICD-10-CM | POA: Diagnosis not present

## 2014-08-09 DIAGNOSIS — R532 Functional quadriplegia: Secondary | ICD-10-CM | POA: Diagnosis not present

## 2014-08-09 DIAGNOSIS — M1990 Unspecified osteoarthritis, unspecified site: Secondary | ICD-10-CM | POA: Diagnosis present

## 2014-08-09 DIAGNOSIS — E785 Hyperlipidemia, unspecified: Secondary | ICD-10-CM | POA: Diagnosis present

## 2014-08-09 DIAGNOSIS — Z87891 Personal history of nicotine dependence: Secondary | ICD-10-CM

## 2014-08-09 DIAGNOSIS — R5383 Other fatigue: Secondary | ICD-10-CM | POA: Diagnosis present

## 2014-08-09 DIAGNOSIS — I509 Heart failure, unspecified: Secondary | ICD-10-CM

## 2014-08-09 DIAGNOSIS — R062 Wheezing: Secondary | ICD-10-CM

## 2014-08-09 DIAGNOSIS — I5033 Acute on chronic diastolic (congestive) heart failure: Secondary | ICD-10-CM | POA: Diagnosis present

## 2014-08-09 DIAGNOSIS — I10 Essential (primary) hypertension: Secondary | ICD-10-CM | POA: Diagnosis present

## 2014-08-09 DIAGNOSIS — Z7401 Bed confinement status: Secondary | ICD-10-CM

## 2014-08-09 DIAGNOSIS — J9691 Respiratory failure, unspecified with hypoxia: Secondary | ICD-10-CM | POA: Diagnosis present

## 2014-08-09 DIAGNOSIS — Z86010 Personal history of colonic polyps: Secondary | ICD-10-CM | POA: Diagnosis not present

## 2014-08-09 DIAGNOSIS — I739 Peripheral vascular disease, unspecified: Secondary | ICD-10-CM | POA: Diagnosis present

## 2014-08-09 DIAGNOSIS — J441 Chronic obstructive pulmonary disease with (acute) exacerbation: Secondary | ICD-10-CM | POA: Diagnosis present

## 2014-08-09 DIAGNOSIS — M069 Rheumatoid arthritis, unspecified: Secondary | ICD-10-CM | POA: Diagnosis present

## 2014-08-09 DIAGNOSIS — R0600 Dyspnea, unspecified: Secondary | ICD-10-CM

## 2014-08-09 DIAGNOSIS — I2510 Atherosclerotic heart disease of native coronary artery without angina pectoris: Secondary | ICD-10-CM | POA: Diagnosis present

## 2014-08-09 DIAGNOSIS — I252 Old myocardial infarction: Secondary | ICD-10-CM | POA: Diagnosis not present

## 2014-08-09 DIAGNOSIS — R410 Disorientation, unspecified: Secondary | ICD-10-CM | POA: Diagnosis not present

## 2014-08-09 DIAGNOSIS — I209 Angina pectoris, unspecified: Secondary | ICD-10-CM

## 2014-08-09 DIAGNOSIS — Z6827 Body mass index (BMI) 27.0-27.9, adult: Secondary | ICD-10-CM | POA: Diagnosis not present

## 2014-08-09 DIAGNOSIS — Y95 Nosocomial condition: Secondary | ICD-10-CM | POA: Diagnosis present

## 2014-08-09 DIAGNOSIS — F0390 Unspecified dementia without behavioral disturbance: Secondary | ICD-10-CM | POA: Diagnosis present

## 2014-08-09 DIAGNOSIS — Z515 Encounter for palliative care: Secondary | ICD-10-CM | POA: Diagnosis not present

## 2014-08-09 DIAGNOSIS — Z66 Do not resuscitate: Secondary | ICD-10-CM | POA: Diagnosis present

## 2014-08-09 DIAGNOSIS — E43 Unspecified severe protein-calorie malnutrition: Secondary | ICD-10-CM | POA: Diagnosis present

## 2014-08-09 DIAGNOSIS — G9340 Encephalopathy, unspecified: Secondary | ICD-10-CM | POA: Diagnosis present

## 2014-08-09 DIAGNOSIS — Z993 Dependence on wheelchair: Secondary | ICD-10-CM | POA: Diagnosis not present

## 2014-08-09 DIAGNOSIS — Z7982 Long term (current) use of aspirin: Secondary | ICD-10-CM

## 2014-08-09 LAB — URINE MICROSCOPIC-ADD ON

## 2014-08-09 LAB — I-STAT CG4 LACTIC ACID, ED: Lactic Acid, Venous: 1.19 mmol/L (ref 0.5–2.0)

## 2014-08-09 LAB — COMPREHENSIVE METABOLIC PANEL
ALK PHOS: 106 U/L (ref 39–117)
ALT: 17 U/L (ref 0–53)
ANION GAP: 7 (ref 5–15)
AST: 23 U/L (ref 0–37)
Albumin: 3.3 g/dL — ABNORMAL LOW (ref 3.5–5.2)
BUN: 18 mg/dL (ref 6–23)
CO2: 29 mmol/L (ref 19–32)
Calcium: 8.6 mg/dL (ref 8.4–10.5)
Chloride: 104 mmol/L (ref 96–112)
Creatinine, Ser: 0.93 mg/dL (ref 0.50–1.35)
GFR calc Af Amer: 90 mL/min — ABNORMAL LOW (ref >90)
GFR calc non Af Amer: 77 mL/min — ABNORMAL LOW (ref >90)
GLUCOSE: 132 mg/dL — AB (ref 70–99)
POTASSIUM: 3.9 mmol/L (ref 3.5–5.1)
Sodium: 140 mmol/L (ref 135–145)
Total Bilirubin: 1 mg/dL (ref 0.3–1.2)
Total Protein: 6.9 g/dL (ref 6.0–8.3)

## 2014-08-09 LAB — CBC WITH DIFFERENTIAL/PLATELET
BASOS PCT: 1 % (ref 0–1)
Basophils Absolute: 0.1 10*3/uL (ref 0.0–0.1)
EOS ABS: 0.4 10*3/uL (ref 0.0–0.7)
Eosinophils Relative: 5 % (ref 0–5)
HEMATOCRIT: 40.8 % (ref 39.0–52.0)
Hemoglobin: 12.7 g/dL — ABNORMAL LOW (ref 13.0–17.0)
LYMPHS PCT: 8 % — AB (ref 12–46)
Lymphs Abs: 0.8 10*3/uL (ref 0.7–4.0)
MCH: 27.5 pg (ref 26.0–34.0)
MCHC: 31.1 g/dL (ref 30.0–36.0)
MCV: 88.3 fL (ref 78.0–100.0)
MONO ABS: 0.3 10*3/uL (ref 0.1–1.0)
MONOS PCT: 3 % (ref 3–12)
Neutro Abs: 8.3 10*3/uL — ABNORMAL HIGH (ref 1.7–7.7)
Neutrophils Relative %: 83 % — ABNORMAL HIGH (ref 43–77)
PLATELETS: 250 10*3/uL (ref 150–400)
RBC: 4.62 MIL/uL (ref 4.22–5.81)
RDW: 13.8 % (ref 11.5–15.5)
WBC: 9.8 10*3/uL (ref 4.0–10.5)

## 2014-08-09 LAB — TROPONIN I: Troponin I: <0.03 ng/mL (ref <0.031)

## 2014-08-09 LAB — URINALYSIS, ROUTINE W REFLEX MICROSCOPIC
Bilirubin Urine: NEGATIVE
Glucose, UA: NEGATIVE mg/dL
KETONES UR: 15 mg/dL — AB
Leukocytes, UA: NEGATIVE
Nitrite: NEGATIVE
PH: 5.5 (ref 5.0–8.0)
Protein, ur: >300 mg/dL — AB
Specific Gravity, Urine: 1.018 (ref 1.005–1.030)
Urobilinogen, UA: 0.2 mg/dL (ref 0.0–1.0)

## 2014-08-09 LAB — MRSA PCR SCREENING: MRSA BY PCR: NEGATIVE

## 2014-08-09 LAB — BRAIN NATRIURETIC PEPTIDE: B Natriuretic Peptide: 41.5 pg/mL (ref 0.0–100.0)

## 2014-08-09 MED ORDER — DEXTROSE 5 % IV SOLN
2.0000 g | Freq: Once | INTRAVENOUS | Status: AC
  Administered 2014-08-09: 2 g via INTRAVENOUS
  Filled 2014-08-09: qty 2

## 2014-08-09 MED ORDER — LORAZEPAM 0.5 MG PO TABS
0.5000 mg | ORAL_TABLET | Freq: Two times a day (BID) | ORAL | Status: DC | PRN
Start: 2014-08-10 — End: 2014-08-18
  Administered 2014-08-13 – 2014-08-17 (×3): 0.5 mg via ORAL
  Filled 2014-08-09 (×4): qty 1

## 2014-08-09 MED ORDER — DIVALPROEX SODIUM 125 MG PO CPSP
250.0000 mg | ORAL_CAPSULE | Freq: Two times a day (BID) | ORAL | Status: DC
  Administered 2014-08-09 – 2014-08-19 (×20): 250 mg via ORAL
  Filled 2014-08-09 (×25): qty 2

## 2014-08-09 MED ORDER — IPRATROPIUM BROMIDE 0.02 % IN SOLN
1.0000 mg | Freq: Once | RESPIRATORY_TRACT | Status: AC
  Administered 2014-08-09: 1 mg via RESPIRATORY_TRACT
  Filled 2014-08-09: qty 5

## 2014-08-09 MED ORDER — SODIUM CHLORIDE 0.9 % IV SOLN: 1000.0000 mL | Freq: Once | INTRAVENOUS | Status: DC

## 2014-08-09 MED ORDER — FUROSEMIDE 10 MG/ML IJ SOLN: 40.0000 mg | Freq: Once | INTRAMUSCULAR | Status: AC

## 2014-08-09 MED ORDER — GUAIFENESIN ER 600 MG PO TB12
600.0000 mg | ORAL_TABLET | Freq: Two times a day (BID) | ORAL | Status: DC
  Administered 2014-08-09 – 2014-08-19 (×20): 600 mg via ORAL
  Filled 2014-08-09 (×21): qty 1

## 2014-08-09 MED ORDER — DEXTROSE-NACL 5-0.45 % IV SOLN
INTRAVENOUS | Status: DC
  Administered 2014-08-09: 1000 mL via INTRAVENOUS
  Administered 2014-08-10: 18:00:00 via INTRAVENOUS

## 2014-08-09 MED ORDER — DONEPEZIL HCL 10 MG PO TABS
20.0000 mg | ORAL_TABLET | Freq: Every day | ORAL | Status: DC
  Administered 2014-08-09 – 2014-08-18 (×10): 20 mg via ORAL
  Filled 2014-08-09 (×8): qty 4
  Filled 2014-08-09 (×2): qty 2
  Filled 2014-08-09 (×3): qty 4

## 2014-08-09 MED ORDER — METHYLPREDNISOLONE SODIUM SUCC 125 MG IJ SOLR: 60.0000 mg | Freq: Four times a day (QID) | INTRAMUSCULAR | Status: DC

## 2014-08-09 MED ORDER — LORATADINE 10 MG PO TABS
10.0000 mg | ORAL_TABLET | Freq: Every day | ORAL | Status: DC
  Administered 2014-08-10 – 2014-08-19 (×10): 10 mg via ORAL
  Filled 2014-08-09 (×10): qty 1

## 2014-08-09 MED ORDER — PRAVASTATIN SODIUM 40 MG PO TABS
40.0000 mg | ORAL_TABLET | Freq: Every day | ORAL | Status: DC
  Administered 2014-08-09 – 2014-08-18 (×10): 40 mg via ORAL
  Filled 2014-08-09 (×5): qty 1
  Filled 2014-08-09: qty 2
  Filled 2014-08-09 (×5): qty 1

## 2014-08-09 MED ORDER — METHYLPREDNISOLONE SODIUM SUCC 125 MG IJ SOLR
60.0000 mg | Freq: Four times a day (QID) | INTRAMUSCULAR | Status: DC
  Administered 2014-08-09 – 2014-08-13 (×15): 60 mg via INTRAVENOUS
  Filled 2014-08-09 (×15): qty 2

## 2014-08-09 MED ORDER — SODIUM CHLORIDE 0.9 % IV SOLN
1000.0000 mL | Freq: Once | INTRAVENOUS | Status: AC
  Administered 2014-08-09: 1000 mL via INTRAVENOUS

## 2014-08-09 MED ORDER — DEXTROSE 5 % IV SOLN
1.0000 g | Freq: Three times a day (TID) | INTRAVENOUS | Status: DC
  Administered 2014-08-10 – 2014-08-12 (×7): 1 g via INTRAVENOUS
  Filled 2014-08-09 (×11): qty 1

## 2014-08-09 MED ORDER — FAMOTIDINE 20 MG PO TABS
20.0000 mg | ORAL_TABLET | ORAL | Status: DC | PRN
  Administered 2014-08-14 – 2014-08-17 (×2): 20 mg via ORAL
  Filled 2014-08-09 (×3): qty 1

## 2014-08-09 MED ORDER — VANCOMYCIN HCL IN DEXTROSE 1-5 GM/200ML-% IV SOLN
1000.0000 mg | Freq: Once | INTRAVENOUS | Status: AC
  Administered 2014-08-09: 1000 mg via INTRAVENOUS
  Filled 2014-08-09: qty 200

## 2014-08-09 MED ORDER — ASPIRIN EC 325 MG PO TBEC
325.0000 mg | DELAYED_RELEASE_TABLET | Freq: Every day | ORAL | Status: DC
  Administered 2014-08-10 – 2014-08-19 (×10): 325 mg via ORAL
  Filled 2014-08-09 (×10): qty 1

## 2014-08-09 MED ORDER — VANCOMYCIN HCL IN DEXTROSE 750-5 MG/150ML-% IV SOLN
750.0000 mg | Freq: Two times a day (BID) | INTRAVENOUS | Status: DC
  Administered 2014-08-10: 750 mg via INTRAVENOUS
  Filled 2014-08-09 (×2): qty 150

## 2014-08-09 MED ORDER — SODIUM CHLORIDE 0.9 % IJ SOLN
3.0000 mL | Freq: Two times a day (BID) | INTRAMUSCULAR | Status: DC
  Administered 2014-08-09 – 2014-08-19 (×16): 3 mL via INTRAVENOUS

## 2014-08-09 MED ORDER — SODIUM CHLORIDE 0.9 % IV SOLN: 1000.0000 mL | INTRAVENOUS | Status: DC

## 2014-08-09 MED ORDER — ALBUTEROL (5 MG/ML) CONTINUOUS INHALATION SOLN
10.0000 mg/h | INHALATION_SOLUTION | Freq: Once | RESPIRATORY_TRACT | Status: AC
  Administered 2014-08-09: 10 mg/h via RESPIRATORY_TRACT
  Filled 2014-08-09: qty 20

## 2014-08-09 MED ORDER — FUROSEMIDE 10 MG/ML IJ SOLN
40.0000 mg | Freq: Once | INTRAMUSCULAR | Status: AC
  Administered 2014-08-09: 40 mg via INTRAVENOUS
  Filled 2014-08-09: qty 4

## 2014-08-09 MED ORDER — BUDESONIDE 0.25 MG/2ML IN SUSP
0.2500 mg | Freq: Two times a day (BID) | RESPIRATORY_TRACT | Status: DC
  Administered 2014-08-09 – 2014-08-19 (×20): 0.25 mg via RESPIRATORY_TRACT
  Filled 2014-08-09 (×20): qty 2

## 2014-08-09 MED ORDER — IPRATROPIUM-ALBUTEROL 0.5-2.5 (3) MG/3ML IN SOLN
3.0000 mL | RESPIRATORY_TRACT | Status: DC
  Administered 2014-08-09 (×2): 3 mL via RESPIRATORY_TRACT
  Filled 2014-08-09 (×2): qty 3

## 2014-08-09 MED ORDER — ENOXAPARIN SODIUM 40 MG/0.4ML ~~LOC~~ SOLN
40.0000 mg | SUBCUTANEOUS | Status: DC
  Administered 2014-08-09 – 2014-08-18 (×10): 40 mg via SUBCUTANEOUS
  Filled 2014-08-09 (×11): qty 0.4

## 2014-08-09 MED ORDER — FUROSEMIDE 10 MG/ML IJ SOLN
20.0000 mg | Freq: Once | INTRAMUSCULAR | Status: AC
  Administered 2014-08-09: 20 mg via INTRAVENOUS
  Filled 2014-08-09: qty 4

## 2014-08-09 MED ORDER — FUROSEMIDE 10 MG/ML IJ SOLN: 40.0000 mg | Freq: Once | INTRAMUSCULAR | Status: DC

## 2014-08-09 NOTE — ED Notes (Signed)
Staff at [facility] noted pt. To be very short of breath this afternoon.  EMS found him to be short of breath with much wheezing upon arrival.  This has improved much, according to them with albuterol h.h.n. En route to hospital.  He arrives short of breath and in no distress.  He is alert and confused, which is his baseline mentation.

## 2014-08-09 NOTE — Progress Notes (Signed)
ANTIBIOTIC CONSULT NOTE - INITIAL  Pharmacy Consult for vancomycin/aztreonam Indication: rule out sepsis  Allergies  Allergen Reactions  . Iohexol Shortness Of Breath and Other (See Comments)     Desc: Increased difficulty breathing, Onset Date: 03159458   . Zolpidem Tartrate Other (See Comments)    REACTION: Acute delirium  . Penicillins Other (See Comments)    unknown    Patient Measurements:   Adjusted Body Weight:   Vital Signs: Temp: 99.8 F (37.7 C) (02/27 1554) Temp Source: Rectal (02/27 1554) BP: 164/86 mmHg (02/27 1554) Pulse Rate: 111 (02/27 1554) Intake/Output from previous day:   Intake/Output from this shift:    Labs: No results for input(s): WBC, HGB, PLT, LABCREA, CREATININE in the last 72 hours. CrCl cannot be calculated (Patient has no serum creatinine result on file.). No results for input(s): VANCOTROUGH, VANCOPEAK, VANCORANDOM, GENTTROUGH, GENTPEAK, GENTRANDOM, TOBRATROUGH, TOBRAPEAK, TOBRARND, AMIKACINPEAK, AMIKACINTROU, AMIKACIN in the last 72 hours.   Microbiology: Recent Results (from the past 720 hour(s))  Culture, sputum-assessment     Status: None   Collection Time: 07/14/14  3:30 PM  Result Value Ref Range Status   Specimen Description SPUTUM  Final   Special Requests NONE  Final   Sputum evaluation   Final    THIS SPECIMEN IS ACCEPTABLE. RESPIRATORY CULTURE REPORT TO FOLLOW.   Report Status 07/14/2014 FINAL  Final  Culture, respiratory (NON-Expectorated)     Status: None   Collection Time: 07/14/14  3:30 PM  Result Value Ref Range Status   Specimen Description SPUTUM  Final   Special Requests NONE  Final   Gram Stain   Final    RARE WBC PRESENT, PREDOMINANTLY PMN RARE SQUAMOUS EPITHELIAL CELLS PRESENT RARE GRAM POSITIVE COCCI IN PAIRS Performed at [facility]    Culture   Final    NORMAL OROPHARYNGEAL FLORA Performed at [facility]    Report Status 07/17/2014 FINAL  Final    Medical History: Past Medical  History  Diagnosis Date  . Peripheral vascular disease   . Osteoarthritis   . Hypertension   . Hyperlipidemia   . CAD (coronary artery disease)     Non stemi 3/11; PTCA first OM 3/11  . COPD (chronic obstructive pulmonary disease)   . History of colonic polyps   . Dementia   . Back pain   . Rheumatoid arthritis(714.0) 2013    [name]    Assessment: 73 YOM presents from NH with shortness of breath. Code sepsis initiated w/ Vancomycin and aztreonam ordered per pharmacy.  PCN allergy listed with unknown reaction.  CXR reveals BL edema vs pneumonia.   2/27 >> vancomycin  >> 2/27 >> aztreonam  >>    Tmax: 99.8 WBCs: WNL Renal: SCr WNL, normalized CrCl = 24ml/min  2/27 blood: / urine: ordered    Goal of Therapy:  Vancomycin trough level 15-20 mcg/ml  Plan:   Vancomycin 1gm x 1 in ED then 750mg  IV q12h  Check vancomycin trough if remains on vancomycin > 72hrs  Monitor renal functon  Aztreonam 1gm IV q8h  Has h/o PCN allergy with unknown reaction, consider change aztreonam to cefepime  , PharmD, BCPS.   Pager: Juliette Alcide  08/09/2014,4:22 PM

## 2014-08-09 NOTE — ED Notes (Signed)
Antibiotic begun immediately after blood cx drawn.  He is currently receiving an hour-long neb. Tx. And remains in no distress.  Wife is at bedside.

## 2014-08-09 NOTE — ED Notes (Signed)
I have just given report to Judeth Cornfield, RN in ICU; and my colleague, Rolly Salter will transport now.  His wife remains with him.

## 2014-08-09 NOTE — ED Notes (Signed)
Bed: RESB Expected date:  Expected time:  Means of arrival:  Comments: Resp distress 

## 2014-08-09 NOTE — H&P (Addendum)
History and Physical    [name] [name] KZS:[id number] DOB: February 19, 1932 DOA: 08/09/2014  Referring physician: Dr. Loretha Stapler PCP: Hoyle Sauer, MD  Specialists: none   Chief Complaint: lethargy   HPI: [name] [name] is a 81 y.o. male has a past medical history significant for COPD, coronary artery disease, hypertension, hyperlipidemia, severe dementia, is being brought from the nursing home with lethargy and decreased responsiveness today. In the emergency room, patient is lethargic, wakes up and is able to answer yes no questions, he denies any pain or breathing difficulties. He is alert person only. His wife is in the room and she tells me that there are no reported fever or chills, she has never complained of any pain, no abdominal pain nausea or vomiting, he had one episode of loose stool yesterday but nothing today. His wife also reports that his legs have been progressively being more swollen in the last days. In the emergency room, patient has a low-grade temp of 99.8, he is tachycardic to 111, his blood pressure is stable. Blood work with mild anemia, without leukocytosis, and a chest x-ray shows small bilateral pleural effusions with a pattern? Edema/pneumonia. TRH was asked for admission for HCAP vs CHF. He was given vancomycin and aztreonam in the emergency room.  Review of Systems: Unable to obtain review of system due to patient's underlying dementia and lethargy  Past Medical History  Diagnosis Date  . Peripheral vascular disease   . Osteoarthritis   . Hypertension   . Hyperlipidemia   . CAD (coronary artery disease)     Non stemi 3/11; PTCA first OM 3/11  . COPD (chronic obstructive pulmonary disease)   . History of colonic polyps   . Dementia   . Back pain   . Rheumatoid arthritis(714.0) 2013    [name]    Past Surgical History  Procedure Laterality Date  . Iliac artery stent      Stenting of left common iliac and external iliac  . Coronary angioplasty   08/2009    First OM   Social History:  reports that he quit smoking about 29 years ago. His smoking use included Cigarettes. He has a 45 pack-year smoking history. He has never used smokeless tobacco. He reports that he does not drink alcohol or use illicit drugs.  Allergies  Allergen Reactions  . Iohexol Shortness Of Breath and Other (See Comments)     Desc: Increased difficulty breathing, Onset Date: [id number]   . Zolpidem Tartrate Other (See Comments)    REACTION: Acute delirium  . Penicillins Other (See Comments)    unknown   Cannot obtain family history from patient as he is demented. Wife denies history of CHF but strong family history of cancer.   Prior to Admission medications   Medication Sig Start Date End Date Taking? Authorizing Provider  acetaminophen (TYLENOL) 500 MG tablet Take 1,000 mg by mouth daily as needed for mild pain or headache.   Yes Historical Provider, MD  albuterol (PROVENTIL) (2.5 MG/3ML) 0.083% nebulizer solution Take 3 mLs (2.5 mg total) by nebulization every 4 (four) hours as needed for wheezing. 07/16/14  Yes Jeanella Craze, NP  aspirin 325 MG EC tablet Take 325 mg by mouth daily.     Yes Historical Provider, MD  b complex vitamins tablet Take 1 tablet by mouth daily.   Yes Historical Provider, MD  budesonide (PULMICORT) 0.25 MG/2ML nebulizer solution Take 2 mLs (0.25 mg total) by nebulization 2 (two) times daily. 07/16/14  Yes Jeanella Craze, NP  cholecalciferol (VITAMIN D) 1000 UNITS tablet Take 1,000 Units by mouth daily.     Yes Historical Provider, MD  divalproex (DEPAKOTE SPRINKLE) 125 MG capsule Take 250 mg by mouth 2 (two) times daily.   Yes Historical Provider, MD  donepezil (ARICEPT) 10 MG tablet Take 20 mg by mouth at bedtime.    Yes Historical Provider, MD  famotidine (PEPCID) 20 MG tablet Take 20 mg by mouth as needed for heartburn.    Yes Historical Provider, MD  guaiFENesin (MUCINEX) 600 MG 12 hr tablet Take 600 mg by mouth 2 (two) times daily.    Yes Historical Provider, MD  ipratropium-albuterol (DUONEB) 0.5-2.5 (3) MG/3ML SOLN Take 3 mLs by nebulization every 6 (six) hours. 07/16/14  Yes Jeanella Craze, NP  leflunomide (ARAVA) 20 MG tablet Take 20 mg by mouth daily.   Yes Historical Provider, MD  loratadine (CLARITIN) 10 MG tablet Take 10 mg by mouth daily.   Yes Historical Provider, MD  LORazepam (ATIVAN) 0.5 MG tablet Take 0.5 mg by mouth 2 (two) times daily as needed for anxiety.   Yes Historical Provider, MD  nitroGLYCERIN (NITROSTAT) 0.4 MG SL tablet Place 0.4 mg under the tongue every 5 (five) minutes x 3 doses as needed. For chest pain.   Yes Historical Provider, MD  pravastatin (PRAVACHOL) 40 MG tablet take 1 tablet by mouth at bedtime   Yes Wendall Stade, MD  [facility] HFA 108 (90 BASE) MCG/ACT inhaler inhale 2 puffs by mouth every 4 hours if needed for wheezing 05/22/14  Yes Storm Frisk, MD  traZODone (DESYREL) 50 MG tablet Take 50 mg by mouth at bedtime.    Yes Historical Provider, MD  furosemide (LASIX) 20 MG tablet Take 1 tablet (20 mg total) by mouth daily. Patient not taking: Reported on 08/09/2014 07/29/14 07/29/15  Julio Sicks, NP   Physical Exam: Filed Vitals:   08/09/14 1658 08/09/14 1706 08/09/14 1730 08/09/14 1805  BP: 131/87  122/57 141/69  Pulse: 108   110  Temp:  97.6 F (36.4 C)    TempSrc:  Oral    Resp: 16   18  Height:  5\' 7"  (1.702 m)    Weight:  78.926 kg (174 lb)    SpO2: 96%   96%     General:  Patient is lying in bed with continuous neb on, lethargic  Eyes: PERRL, no scleral icterus  Neck: supple, no lymphadenopathy  Cardiovascular: regular rate without MRG; 2+ peripheral pulses, 2+ pitting peripheral edema  Respiratory: Diffuse wheezing as well as crackles bilaterally on anterior auscultation  Abdomen: soft, non tender to palpation, positive bowel sounds, no guarding, no rebound  Skin: no rashes  Musculoskeletal: normal bulk and tone, no joint swelling  Neurologic: Able to  follow commands, moves all 4 extremities, nonfocal  Labs on Admission:  Basic Metabolic Panel:  Recent Labs Lab 08/09/14 1651  NA 140  K 3.9  CL 104  CO2 29  GLUCOSE 132*  BUN 18  CREATININE 0.93  CALCIUM 8.6   Liver Function Tests:  Recent Labs Lab 08/09/14 1651  AST 23  ALT 17  ALKPHOS 106  BILITOT 1.0  PROT 6.9  ALBUMIN 3.3*   CBC:  Recent Labs Lab 08/09/14 1651  WBC 9.8  NEUTROABS 8.3*  HGB 12.7*  HCT 40.8  MCV 88.3  PLT 250   BNP (last 3 results)  Recent Labs  07/10/14 1445  BNP 67.9  ProBNP (last 3 results)  Recent Labs  04/12/14 1048  PROBNP 616.1*   Radiological Exams on Admission: Dg Chest Port 1 View  08/09/2014   CLINICAL DATA:  Shortness of breath and wheezing. Multiple diagnosis of pneumonia and bronchitis in the past.  EXAM: PORTABLE CHEST - 1 VIEW  COMPARISON:  07/29/2014  FINDINGS: Shallow inspiration. Normal heart size and pulmonary vascularity. Diffuse central interstitial pattern in the lungs may represent edema or interstitial pneumonia. Blunting of the costophrenic angle suggesting small pleural effusions. Calcified and tortuous aorta. Degenerative changes in the spine and shoulders.  IMPRESSION: Small bilateral pleural effusions. Central interstitial pattern suggest edema or interstitial pneumonia.   Electronically Signed   By: Burman Nieves M.D.   On: 08/09/2014 17:10    EKG: Independently reviewed. Right bundle branch block  Assessment/Plan Principal Problem:   HCAP (healthcare-associated pneumonia) Active Problems:   COPD exacerbation   Dementia   Hypertension   Acute exacerbation of CHF (congestive heart failure)   Respiratory failure with hypoxia   HCAP - chest x-ray with slight evidence of pneumonia - Start vancomycin and aztreonam - Obtain sputum and blood cultures  CHF exacerbation - no prior 2-D echo in the system - He has pitting lower extremity edema as well as crackles on exam as well as a chest  x-ray with evidence of fluid overload - We'll administer 60 mg of IV Lasix for now, monitor response and likely repeat Lasix in the morning - Obtain a 2-D echo - RBBB which was not present in 2014, cycle troponin. He is not a candidate for invasive testing.  COPD with exacerbation - possibly due to #1 and #2 - Antibiotics as above, Lasix, DuoNeb's, IV steroids  Acute on chronic hypoxic respiratory failure - likely multifactorial due to #1, #2 and #3  Dementia - wife endorses some behavioral issues, have asked the nursing staff to protect the IVs as patient tends to pull out his own IVs   Goals of care - patient is DO NOT RESUSCITATE, have discussed with wife and he would not want intubation or resuscitation. If he is to clinically deteriorate despite medical management, she wants to ensure that he will be comfortable.    Diet: NPO Fluids: none DVT Prophylaxis: Lovenox  Code Status: DNR  Family Communication: d/w wife bedside  Disposition Plan: admit to SDU  Time spent: 68  Aadam Zhen M. Elvera Lennox, MD [facility] Pager [phone]  If 7PM-7AM, please contact night-coverage [email or web address] Password [facility] 08/09/2014, 6:08 PM

## 2014-08-09 NOTE — ED Notes (Signed)
Dr. Loretha Stapler consulted--IV bolus in.  He is made aware of pt. Condition/v.s. And d/c's any further IV boluses; and reminds me to give IV Lasix (20mg ), which I do.

## 2014-08-09 NOTE — ED Provider Notes (Signed)
CSN: [id number]     Arrival date & time 08/09/14  1551 History   First MD Initiated Contact with Patient 08/09/14 1556     Chief Complaint  Patient presents with  . Shortness of Breath     (Consider location/radiation/quality/duration/timing/severity/associated sxs/prior Treatment) Patient is a 81 y.o. male presenting with shortness of breath.  Shortness of Breath Severity:  Moderate Onset quality:  Gradual Duration:  2 days Timing:  Constant Progression:  Worsening Chronicity:  New Relieved by: nebulizer, oxygen. Worsened by:  Exertion Associated symptoms: cough and sputum production   Associated symptoms: no fever     Past Medical History  Diagnosis Date  . Peripheral vascular disease   . Osteoarthritis   . Hypertension   . Hyperlipidemia   . CAD (coronary artery disease)     Non stemi 3/11; PTCA first OM 3/11  . COPD (chronic obstructive pulmonary disease)   . History of colonic polyps   . Dementia   . Back pain   . Rheumatoid arthritis(714.0) 2013    [name]    Past Surgical History  Procedure Laterality Date  . Iliac artery stent      Stenting of left common iliac and external iliac  . Coronary angioplasty  08/2009    First OM   No family history on file. History  Substance Use Topics  . Smoking status: Former Smoker -- 1.50 packs/day for 30 years    Types: Cigarettes    Quit date: 06/13/1985  . Smokeless tobacco: Never Used  . Alcohol Use: No     Comment: Denies binge drinking    Review of Systems  Constitutional: Negative for fever.  Respiratory: Positive for cough, sputum production and shortness of breath.   All other systems reviewed and are negative.     Allergies  Iohexol; Zolpidem tartrate; and Penicillins  Home Medications   Prior to Admission medications   Medication Sig Start Date End Date Taking? Authorizing Provider  acetaminophen (TYLENOL) 500 MG tablet Take 1,000 mg by mouth daily as needed for mild pain or headache.    Yes Historical Provider, MD  albuterol (PROVENTIL) (2.5 MG/3ML) 0.083% nebulizer solution Take 3 mLs (2.5 mg total) by nebulization every 4 (four) hours as needed for wheezing. 07/16/14  Yes Jeanella Craze, NP  aspirin 325 MG EC tablet Take 325 mg by mouth daily.     Yes Historical Provider, MD  b complex vitamins tablet Take 1 tablet by mouth daily.   Yes Historical Provider, MD  budesonide (PULMICORT) 0.25 MG/2ML nebulizer solution Take 2 mLs (0.25 mg total) by nebulization 2 (two) times daily. 07/16/14  Yes Jeanella Craze, NP  cholecalciferol (VITAMIN D) 1000 UNITS tablet Take 1,000 Units by mouth daily.     Yes Historical Provider, MD  divalproex (DEPAKOTE SPRINKLE) 125 MG capsule Take 250 mg by mouth 2 (two) times daily.   Yes Historical Provider, MD  donepezil (ARICEPT) 10 MG tablet Take 20 mg by mouth at bedtime.    Yes Historical Provider, MD  famotidine (PEPCID) 20 MG tablet Take 20 mg by mouth as needed for heartburn.    Yes Historical Provider, MD  guaiFENesin (MUCINEX) 600 MG 12 hr tablet Take 600 mg by mouth 2 (two) times daily.   Yes Historical Provider, MD  ipratropium-albuterol (DUONEB) 0.5-2.5 (3) MG/3ML SOLN Take 3 mLs by nebulization every 6 (six) hours. 07/16/14  Yes Jeanella Craze, NP  leflunomide (ARAVA) 20 MG tablet Take 20 mg by mouth daily.  Yes Historical Provider, MD  loratadine (CLARITIN) 10 MG tablet Take 10 mg by mouth daily.   Yes Historical Provider, MD  LORazepam (ATIVAN) 0.5 MG tablet Take 0.5 mg by mouth 2 (two) times daily as needed for anxiety.   Yes Historical Provider, MD  nitroGLYCERIN (NITROSTAT) 0.4 MG SL tablet Place 0.4 mg under the tongue every 5 (five) minutes x 3 doses as needed. For chest pain.   Yes Historical Provider, MD  pravastatin (PRAVACHOL) 40 MG tablet take 1 tablet by mouth at bedtime   Yes Wendall Stade, MD  [facility] HFA 108 (90 BASE) MCG/ACT inhaler inhale 2 puffs by mouth every 4 hours if needed for wheezing 05/22/14  Yes Storm Frisk, MD   traZODone (DESYREL) 50 MG tablet Take 50 mg by mouth at bedtime.    Yes Historical Provider, MD  furosemide (LASIX) 20 MG tablet Take 1 tablet (20 mg total) by mouth daily. Patient not taking: Reported on 08/09/2014 07/29/14 07/29/15  Tammy S Parrett, NP   BP 131/87 mmHg  Pulse 108  Temp(Src) 99.8 F (37.7 C) (Rectal)  Resp 16  Ht 5\' 7"  (1.702 m)  Wt 174 lb (78.926 kg)  BMI 27.25 kg/m2  SpO2 96% Physical Exam  Constitutional: He appears well-developed and well-nourished. No distress.  HENT:  Head: Normocephalic and atraumatic.  Mouth/Throat: Oropharynx is clear and moist.  Eyes: Conjunctivae are normal. Pupils are equal, round, and reactive to light. No scleral icterus.  Neck: Neck supple.  Cardiovascular: Regular rhythm, normal heart sounds and intact distal pulses.  Tachycardia present.   No murmur heard. Pulmonary/Chest: Accessory muscle usage present. No stridor. Tachypnea noted. He is in respiratory distress. He has wheezes (diffuse inspiratory and expiratory). He has no rales.  Abdominal: Soft. He exhibits no distension. There is no tenderness.  Musculoskeletal: Normal range of motion. He exhibits edema (2+ BLE).  Neurological: He is alert. He is not disoriented.  Skin: Skin is warm and dry. No rash noted.  Psychiatric: He has a normal mood and affect. His behavior is normal.  Nursing note and vitals reviewed.   ED Course  Procedures (including critical care time) Labs Review Labs Reviewed  CBC WITH DIFFERENTIAL/PLATELET - Abnormal; Notable for the following:    Hemoglobin 12.7 (*)    Neutrophils Relative % 83 (*)    Neutro Abs 8.3 (*)    Lymphocytes Relative 8 (*)    All other components within normal limits  COMPREHENSIVE METABOLIC PANEL - Abnormal; Notable for the following:    Glucose, Bld 132 (*)    Albumin 3.3 (*)    GFR calc non Af Amer 77 (*)    GFR calc Af Amer 90 (*)    All other components within normal limits  URINALYSIS, ROUTINE W REFLEX MICROSCOPIC -  Abnormal; Notable for the following:    Hgb urine dipstick SMALL (*)    Ketones, ur 15 (*)    Protein, ur >300 (*)    All other components within normal limits  MRSA PCR SCREENING  CULTURE, BLOOD (ROUTINE X 2)  CULTURE, BLOOD (ROUTINE X 2)  URINE CULTURE  CULTURE, EXPECTORATED SPUTUM-ASSESSMENT  GRAM STAIN  BRAIN NATRIURETIC PEPTIDE  TROPONIN I  URINE MICROSCOPIC-ADD ON  COMPREHENSIVE METABOLIC PANEL  CBC  LEGIONELLA ANTIGEN, URINE  STREP PNEUMONIAE URINARY ANTIGEN  INFLUENZA PANEL BY PCR (TYPE A & B, H1N1)  TROPONIN I  TROPONIN I  I-STAT CG4 LACTIC ACID, ED  I-STAT CG4 LACTIC ACID, ED    Imaging  Review Dg Chest Port 1 View  08/09/2014   CLINICAL DATA:  Shortness of breath and wheezing. Multiple diagnosis of pneumonia and bronchitis in the past.  EXAM: PORTABLE CHEST - 1 VIEW  COMPARISON:  07/29/2014  FINDINGS: Shallow inspiration. Normal heart size and pulmonary vascularity. Diffuse central interstitial pattern in the lungs may represent edema or interstitial pneumonia. Blunting of the costophrenic angle suggesting small pleural effusions. Calcified and tortuous aorta. Degenerative changes in the spine and shoulders.  IMPRESSION: Small bilateral pleural effusions. Central interstitial pattern suggest edema or interstitial pneumonia.   Electronically Signed   By: Burman Nieves M.D.   On: 08/09/2014 17:10  All radiology studies independently viewed by me.      EKG Interpretation   Date/Time:  Saturday August 09 2014 16:06:10 EST Ventricular Rate:  111 PR Interval:  135 QRS Duration: 134 QT Interval:  363 QTC Calculation: 493 R Axis:   -148 Text Interpretation:  Sinus tachycardia Right bundle branch block Inferior  infarct, old compared to prior, now has RBBB and increased rate Confirmed  by [facility]  MD, TREY ([id number]) on 08/09/2014 4:12:34 PM      MDM   Final diagnoses:  Acute respiratory failure with hypoxia  HCAP (healthcare-associated pneumonia)  COPD  exacerbation    81 yo male presenting with respiratory distress.  Found to be wheezing and hypoxic with increased WOB and accessory muscle usage.  He reportedly received steroids PTA, as well as albuterol and supplemental O2.  Has severe wheezing on exam, but also has pitting edema in legs.  Covered for HCAP given his respiratory failure.  Could also be a component of CHF.  Plan admit to medicine.      Candyce Churn III, MD 08/10/14 [phone]

## 2014-08-10 ENCOUNTER — Encounter (HOSPITAL_COMMUNITY): Payer: Self-pay | Admitting: *Deleted

## 2014-08-10 DIAGNOSIS — J441 Chronic obstructive pulmonary disease with (acute) exacerbation: Secondary | ICD-10-CM

## 2014-08-10 DIAGNOSIS — I509 Heart failure, unspecified: Secondary | ICD-10-CM

## 2014-08-10 LAB — INFLUENZA PANEL BY PCR (TYPE A & B)
H1N1 flu by pcr: NOT DETECTED
INFLBPCR: NEGATIVE
Influenza A By PCR: NEGATIVE

## 2014-08-10 LAB — CBC
HCT: 36.9 % — ABNORMAL LOW (ref 39.0–52.0)
Hemoglobin: 11.8 g/dL — ABNORMAL LOW (ref 13.0–17.0)
MCH: 27.7 pg (ref 26.0–34.0)
MCHC: 32 g/dL (ref 30.0–36.0)
MCV: 86.6 fL (ref 78.0–100.0)
Platelets: 236 10*3/uL (ref 150–400)
RBC: 4.26 MIL/uL (ref 4.22–5.81)
RDW: 13.7 % (ref 11.5–15.5)
WBC: 4.6 10*3/uL (ref 4.0–10.5)

## 2014-08-10 LAB — COMPREHENSIVE METABOLIC PANEL
ALBUMIN: 2.7 g/dL — AB (ref 3.5–5.2)
ALT: 13 U/L (ref 0–53)
AST: 19 U/L (ref 0–37)
Alkaline Phosphatase: 87 U/L (ref 39–117)
Anion gap: 8 (ref 5–15)
BUN: 18 mg/dL (ref 6–23)
CALCIUM: 8 mg/dL — AB (ref 8.4–10.5)
CHLORIDE: 98 mmol/L (ref 96–112)
CO2: 31 mmol/L (ref 19–32)
Creatinine, Ser: 0.82 mg/dL (ref 0.50–1.35)
GFR calc non Af Amer: 81 mL/min — ABNORMAL LOW (ref >90)
Glucose, Bld: 182 mg/dL — ABNORMAL HIGH (ref 70–99)
POTASSIUM: 3.4 mmol/L — AB (ref 3.5–5.1)
Sodium: 137 mmol/L (ref 135–145)
Total Bilirubin: 0.6 mg/dL (ref 0.3–1.2)
Total Protein: 6 g/dL (ref 6.0–8.3)

## 2014-08-10 LAB — TROPONIN I
TROPONIN I: 0.13 ng/mL — AB (ref <0.031)
Troponin I: 0.23 ng/mL — ABNORMAL HIGH (ref <0.031)

## 2014-08-10 LAB — STREP PNEUMONIAE URINARY ANTIGEN: Strep Pneumo Urinary Antigen: NEGATIVE

## 2014-08-10 MED ORDER — VANCOMYCIN HCL IN DEXTROSE 1-5 GM/200ML-% IV SOLN
1000.0000 mg | Freq: Two times a day (BID) | INTRAVENOUS | Status: DC
  Administered 2014-08-10 – 2014-08-12 (×4): 1000 mg via INTRAVENOUS
  Filled 2014-08-10 (×5): qty 200

## 2014-08-10 MED ORDER — POTASSIUM CHLORIDE CRYS ER 20 MEQ PO TBCR
40.0000 meq | EXTENDED_RELEASE_TABLET | Freq: Once | ORAL | Status: AC
  Administered 2014-08-10: 40 meq via ORAL
  Filled 2014-08-10: qty 2

## 2014-08-10 MED ORDER — CETYLPYRIDINIUM CHLORIDE 0.05 % MT LIQD
7.0000 mL | Freq: Two times a day (BID) | OROMUCOSAL | Status: DC
  Administered 2014-08-10 – 2014-08-18 (×9): 7 mL via OROMUCOSAL

## 2014-08-10 MED ORDER — FUROSEMIDE 10 MG/ML IJ SOLN
40.0000 mg | Freq: Once | INTRAMUSCULAR | Status: AC
  Administered 2014-08-10: 40 mg via INTRAVENOUS
  Filled 2014-08-10: qty 4

## 2014-08-10 MED ORDER — IPRATROPIUM-ALBUTEROL 0.5-2.5 (3) MG/3ML IN SOLN
3.0000 mL | Freq: Three times a day (TID) | RESPIRATORY_TRACT | Status: DC
  Administered 2014-08-10 – 2014-08-14 (×13): 3 mL via RESPIRATORY_TRACT
  Filled 2014-08-10 (×13): qty 3

## 2014-08-10 MED ORDER — TRAZODONE HCL 50 MG PO TABS
50.0000 mg | ORAL_TABLET | Freq: Once | ORAL | Status: AC
  Administered 2014-08-10: 50 mg via ORAL
  Filled 2014-08-10: qty 1

## 2014-08-10 MED ORDER — CHLORHEXIDINE GLUCONATE 0.12 % MT SOLN
15.0000 mL | Freq: Two times a day (BID) | OROMUCOSAL | Status: DC
  Administered 2014-08-10 – 2014-08-19 (×15): 15 mL via OROMUCOSAL
  Filled 2014-08-10 (×16): qty 15

## 2014-08-10 NOTE — Progress Notes (Signed)
Patient has been in soft belt restraint and soft wrist restraints since 2300 last night. But since taking hs meds and going to sleep patient has been calmly sleeping in bed. Only times became agitated and reached out grabbing at staff was when he was changed due to incontinence. Has been settling down after repositioning and going back to sleep.

## 2014-08-10 NOTE — Progress Notes (Signed)
PROGRESS NOTE  [name] [name] Gerald JJO:[id number] DOB: 1931/09/13 DOA: 08/09/2014 PCP: Hoyle Sauer, MD  HPI: 81 y.o. male has a past medical history significant for COPD, coronary artery disease, hypertension, hyperlipidemia, severe dementia, is being brought from the nursing home with lethargy and decreased responsiveness. Found to have hypoxic respiratory failure.   Subjective / 24 H Interval events - sleeping this morning, wakes up to voice but would not answer questions  Assessment/Plan: Principal Problem:   HCAP (healthcare-associated pneumonia) Active Problems:   COPD exacerbation   Dementia   Hypertension   Acute exacerbation of CHF (congestive heart failure)   Respiratory failure with hypoxia   HCAP - chest x-ray with slight evidence of pneumonia - continue vancomycin and aztreonam - sputum and blood cultures pending - SLP to evaluate given concorrent dementia  CHF exacerbation - no prior 2-D echo in the system - responded well to 60 of Lasix last night, decreased swelling in his legs - repeat Lasix 40 today - BMP with good renal function - K repleted - 2D echo pending - RBBB which was not present in 2014, cycle troponin. He is not a candidate for invasive testing. Troponins low overnight, likely demand.   COPD with exacerbation - possibly due to #1 and #2 - Antibiotics as above, Lasix, DuoNeb's, IV steroids  Acute on chronic hypoxic respiratory failure - likely multifactorial due to #1, #2 and #3  Dementia - at baseline  Goals of care - patient is DO NOT RESUSCITATE, have discussed with wife and he would not want intubation or resuscitation. If he is to clinically deteriorate despite medical management, she wants to ensure that he will be comfortable.    Diet: Diet NPO time specified Fluids: none DVT Prophylaxis: Lovenox  Code Status: DNR Family Communication: none bedside  Disposition Plan: transfer to floor today   Consultants:  None    Procedures:  None    Antibiotics Vancomycin 2/27 >> Aztreonam 2/27 >>   Studies  Dg Chest Port 1 View  08/09/2014   CLINICAL DATA:  Shortness of breath and wheezing. Multiple diagnosis of pneumonia and bronchitis in the past.  EXAM: PORTABLE CHEST - 1 VIEW  COMPARISON:  07/29/2014  FINDINGS: Shallow inspiration. Normal heart size and pulmonary vascularity. Diffuse central interstitial pattern in the lungs may represent edema or interstitial pneumonia. Blunting of the costophrenic angle suggesting small pleural effusions. Calcified and tortuous aorta. Degenerative changes in the spine and shoulders.  IMPRESSION: Small bilateral pleural effusions. Central interstitial pattern suggest edema or interstitial pneumonia.   Electronically Signed   By: Burman Nieves M.D.   On: 08/09/2014 17:10    Objective  Filed Vitals:   08/10/14 0556 08/10/14 0600 08/10/14 0605 08/10/14 0700  BP:  121/62  120/59  Pulse: 79 77 76 74  Temp:      TempSrc:      Resp: 13 18 17 17   Height:      Weight:      SpO2: 97% 98% 97% 95%    Intake/Output Summary (Last 24 hours) at 08/10/14 0711 Last data filed at 08/10/14 0540  Gross per 24 hour  Intake 448.33 ml  Output    625 ml  Net -176.67 ml   Filed Weights   08/09/14 1706 08/10/14 0500  Weight: 78.926 kg (174 lb) 80.6 kg (177 lb 11.1 oz)    Exam:  General:  Sleeping, wakes up  HEENT: no scleral icterus  Cardiovascular: RRR without MRG, 1+ edema  Respiratory: scattered wheezing,  improved  Abdomen: soft, non tender  MSK/Extremities: no clubbing/cyanosis  Skin: no rashes  Neuro: non focal   Data Reviewed: Basic Metabolic Panel:  Recent Labs Lab 08/09/14 1651  NA 140  K 3.9  CL 104  CO2 29  GLUCOSE 132*  BUN 18  CREATININE 0.93  CALCIUM 8.6   Liver Function Tests:  Recent Labs Lab 08/09/14 1651  AST 23  ALT 17  ALKPHOS 106  BILITOT 1.0  PROT 6.9  ALBUMIN 3.3*   CBC:  Recent Labs Lab 08/09/14 1651  WBC  9.8  NEUTROABS 8.3*  HGB 12.7*  HCT 40.8  MCV 88.3  PLT 250   Cardiac Enzymes:  Recent Labs Lab 08/09/14 1649 08/10/14 0013  TROPONINI <0.03 0.13*   BNP (last 3 results)  Recent Labs  07/10/14 1445 08/09/14 1650  BNP 67.9 41.5    ProBNP (last 3 results)  Recent Labs  04/12/14 1048  PROBNP 616.1*    CBG: No results for input(s): GLUCAP in the last 168 hours.  Recent Results (from the past 240 hour(s))  MRSA PCR Screening     Status: None   Collection Time: 08/09/14  7:32 PM  Result Value Ref Range Status   MRSA by PCR NEGATIVE NEGATIVE Final    Comment:        The GeneXpert MRSA Assay (FDA approved for NASAL specimens only), is one component of a comprehensive MRSA colonization surveillance program. It is not intended to diagnose MRSA infection nor to guide or monitor treatment for MRSA infections.      Scheduled Meds: . antiseptic oral rinse  7 mL Mouth Rinse q12n4p  . aspirin  325 mg Oral Daily  . aztreonam  1 g Intravenous 3 times per day  . budesonide  0.25 mg Nebulization BID  . chlorhexidine  15 mL Mouth Rinse BID  . divalproex  250 mg Oral BID  . donepezil  20 mg Oral QHS  . enoxaparin (LOVENOX) injection  40 mg Subcutaneous Q24H  . guaiFENesin  600 mg Oral BID  . ipratropium-albuterol  3 mL Nebulization TID  . loratadine  10 mg Oral Daily  . methylPREDNISolone (SOLU-MEDROL) injection  60 mg Intravenous 4 times per day  . pravastatin  40 mg Oral QHS  . sodium chloride  3 mL Intravenous Q12H  . vancomycin  750 mg Intravenous Q12H   Continuous Infusions: . dextrose 5 % and 0.45% NaCl 1,000 mL (08/09/14 2354)    Pamella Pert, MD [facility] Pager [phone]. If 7 PM - 7 AM, please contact night-coverage at [email or web address], password [facility] 08/10/2014, 7:11 AM  LOS: 1 day

## 2014-08-10 NOTE — Progress Notes (Signed)
Utilization review completed.  

## 2014-08-10 NOTE — Progress Notes (Signed)
  Echocardiogram 2D Echocardiogram has been performed.  [name] [name] 08/10/2014, 12:34 PM

## 2014-08-10 NOTE — Progress Notes (Signed)
Patient transferred from ICU to 1422, alert and oriented to self, confuse (hx of dementia), follows command, incont. Of bladder, skin intact, no wound noted, BLE slight redness. Oriented patient/wife to room/unit and reviewed plan of care with patient/wife, will continue to monitor.

## 2014-08-10 NOTE — Progress Notes (Signed)
Second Troponin 0.13 ng/ml (high). Patient with hx of COPD and CAD. Another level due in am. Echo ordered for am as well. VSS. Will monitor.

## 2014-08-10 NOTE — Progress Notes (Signed)
ANTIBIOTIC CONSULT NOTE  Pharmacy Consult for vancomycin/aztreonam Indication: rule out sepsis  Allergies  Allergen Reactions  . Iohexol Shortness Of Breath and Other (See Comments)     Desc: Increased difficulty breathing, Onset Date: [id number]   . Zolpidem Tartrate Other (See Comments)    REACTION: Acute delirium  . Penicillins Other (See Comments)    unknown    Patient Measurements: Height: 5\' 7"  (170.2 cm) Weight: 177 lb 11.1 oz (80.6 kg) IBW/kg (Calculated) : 66.1  Assessment: 82 YOM presents 2/27 from NH with shortness of breath. PMH includes COPD, CAD, severe dementia. CXR reveals BL edema vs pneumonia. Code sepsis initiated w/ Vancomycin and aztreonam ordered per pharmacy. PCN allergy listed with unknown reaction.    Antiinfectives  2/27 >> vancomycin >> 2/27 >> aztreonam >>   Labs / vitals Tmax: remains afebrile WBCs: WNL Renal: SCr 0.82- improved, normalized CrCl = 75 (using SCr 0.8)  Microbiology 2/27 blood x2: ngtd 2/27 urine: IP 2/27 MRSA PCR: negative 2/27 Legionella Ur Ag: 2/27 S pneumo Ur Ag: negative 2/27 flu PCR: none detected  Levels / dose change info 2/28: vancomycin increased to 1g q12h with improved renal function    Goal of Therapy:  Vancomycin trough level 15-20 mcg/ml  Plan:   Increase vancomycin to 1gm IV q12h  Check vancomycin trough if remains on vancomycin > 72hrs  Monitor renal function  Follow up culture data  Continue aztreonam 1gm IV q8h  Has h/o PCN allergy with unknown reaction, consider change aztreonam to cefepime  Thank you for the consult.  3/28, PharmD, BCPS Pager: [phone] Pharmacy: [phone] 08/10/2014 12:32 PM

## 2014-08-10 NOTE — Progress Notes (Signed)
Speech Language Pathology  Patient Details Name: DAIMION ADAMCIK MRN: [id number] DOB: 1931/11/16 Today's Date: 08/10/2014 Time:  -      Order received for swallow assessment. Will initiate 2/29.    Royce Macadamia 08/10/2014, 5:16 PM

## 2014-08-11 DIAGNOSIS — F0391 Unspecified dementia with behavioral disturbance: Secondary | ICD-10-CM

## 2014-08-11 DIAGNOSIS — I5033 Acute on chronic diastolic (congestive) heart failure: Secondary | ICD-10-CM

## 2014-08-11 LAB — BASIC METABOLIC PANEL
ANION GAP: 6 (ref 5–15)
BUN: 25 mg/dL — ABNORMAL HIGH (ref 6–23)
CALCIUM: 8.4 mg/dL (ref 8.4–10.5)
CO2: 29 mmol/L (ref 19–32)
Chloride: 104 mmol/L (ref 96–112)
Creatinine, Ser: 1.06 mg/dL (ref 0.50–1.35)
GFR calc non Af Amer: 64 mL/min — ABNORMAL LOW (ref >90)
GFR, EST AFRICAN AMERICAN: 74 mL/min — AB (ref >90)
Glucose, Bld: 178 mg/dL — ABNORMAL HIGH (ref 70–99)
Potassium: 4.1 mmol/L (ref 3.5–5.1)
Sodium: 139 mmol/L (ref 135–145)

## 2014-08-11 LAB — LEGIONELLA ANTIGEN, URINE

## 2014-08-11 LAB — URINE CULTURE

## 2014-08-11 LAB — TROPONIN I: Troponin I: 0.1 ng/mL — ABNORMAL HIGH (ref <0.031)

## 2014-08-11 MED ORDER — FUROSEMIDE 20 MG PO TABS
20.0000 mg | ORAL_TABLET | Freq: Every day | ORAL | Status: DC
  Administered 2014-08-12 – 2014-08-14 (×3): 20 mg via ORAL
  Filled 2014-08-11 (×4): qty 1

## 2014-08-11 MED ORDER — ALBUTEROL SULFATE (2.5 MG/3ML) 0.083% IN NEBU
2.5000 mg | INHALATION_SOLUTION | Freq: Once | RESPIRATORY_TRACT | Status: AC
  Administered 2014-08-11: 2.5 mg via RESPIRATORY_TRACT
  Filled 2014-08-11: qty 3

## 2014-08-11 NOTE — Clinical Documentation Improvement (Signed)
Supporting Information: Acute exacerbation of CHF per 02/27 progress notes. CHF exacerbation - no prior 2-D echo in the system- responded well to 60 of Lasix last night, decreased swelling in his legs- repeat Lasix 40 today per 02/28 progress notes.    Possible Clinical Condition? . Document acuity --Acute --Chronic --Acute on Chronic . Document type --Diastolic --Systolic --Combined systolic and diastolic . Due to or associated with --Cardiac or other surgery --Hypertension --Valvular disease --Rheumatic heart disease Endocarditis (valvitis) Pericarditis Myocarditis --Other (specify)     Thank Gabriel Cirri Documentation Specialist [phone] [email or web address]

## 2014-08-11 NOTE — Evaluation (Signed)
Physical Therapy Evaluation Patient Details Name: CORT DRAGOO MRN: [id number] DOB: 17-Feb-1932 Today's Date: 08/11/2014   History of Present Illness  81 yo male admitted with Pna. Hx of COPD, HTN, severe dementia, RA, back pain. Pt is from SNF-participating with therapies.   Clinical Impression  On eval, pt required Mod assist for bed mobility/standing and Min assist +2 for ambulation distance of ~7 feet with RW. Pt fatigues easily. Dyspnea 3/4 and wheezing noted with mobility. Recommend return to SNF to continue rehab. O2 sats 92% on 4L with activity (did not remove O2 during session).     Follow Up Recommendations SNF    Equipment Recommendations  Rolling walker with 5" wheels    Recommendations for Other Services       Precautions / Restrictions Precautions Precautions: Fall Precaution Comments: monitor sats Restrictions Weight Bearing Restrictions: No      Mobility  Bed Mobility Overal bed mobility: Needs Assistance Bed Mobility: Supine to Sit     Supine to sit: Mod assist     General bed mobility comments: Assist for LEs and trunk. Increased time. Utilized bedpad for scooting, positioning.   Transfers Overall transfer level: Needs assistance Equipment used: Rolling walker (2 wheeled) Transfers: Sit to/from Stand Sit to Stand: Mod assist         General transfer comment: Assist to rise, stabilize, control descent. Multimodal cues for safety, technique, hand placement   Ambulation/Gait Ambulation/Gait assistance: Min assist;+2 physical assistance;+2 safety/equipment Ambulation Distance (Feet): 7 Feet Assistive device: Rolling walker (2 wheeled) Gait Pattern/deviations: Step-through pattern;Decreased stride length     General Gait Details: assist to stabilize pt and maneuver safely with walker. Pt fatigues easily. Remained on 3L [place] O2. Wheezing noted with mobility. Followed closely with recliner.   Stairs            Wheelchair Mobility     Modified Rankin (Stroke Patients Only)       Balance Overall balance assessment: Needs assistance         Standing balance support: Bilateral upper extremity supported;During functional activity Standing balance-Leahy Scale: Poor                               Pertinent Vitals/Pain Pain Assessment: No/denies pain    Home Living Family/patient expects to be discharged to:: Skilled nursing facility                      Prior Function Level of Independence: Needs assistance               Hand Dominance        Extremity/Trunk Assessment   Upper Extremity Assessment: Generalized weakness           Lower Extremity Assessment: Generalized weakness      Cervical / Trunk Assessment: Kyphotic  Communication   Communication: HOH  Cognition Arousal/Alertness: Awake/alert Behavior During Therapy: WFL for tasks assessed/performed Overall Cognitive Status: Impaired/Different from baseline Area of Impairment: Memory;Safety/judgement     Memory: Decreased short-term memory   Safety/Judgement: Decreased awareness of safety          General Comments      Exercises        Assessment/Plan    PT Assessment Patient needs continued PT services  PT Diagnosis Difficulty walking;Generalized weakness   PT Problem List Decreased strength;Decreased activity tolerance;Decreased balance;Decreased mobility;Decreased knowledge of use of DME;Pain  PT Treatment Interventions DME instruction;Gait training;Functional  mobility training;Therapeutic activities;Therapeutic exercise;Patient/family education;Balance training   PT Goals (Current goals can be found in the Care Plan section)      Frequency Min 3X/week   Barriers to discharge        Co-evaluation               End of Session Equipment Utilized During Treatment: Gait belt;Oxygen Activity Tolerance: Patient limited by fatigue Patient left: in chair;with call bell/phone within  reach;with chair alarm set;with family/visitor present           Time: 0211-1552 PT Time Calculation (min) (ACUTE ONLY): 16 min   Charges:   PT Evaluation $Initial PT Evaluation Tier I: 1 Procedure     PT G Codes:        Rebeca Alert, MPT Pager: [phone]

## 2014-08-11 NOTE — Progress Notes (Signed)
PROGRESS NOTE  [name] [name] ZOX:[id number] DOB: 08/24/1931 DOA: 08/09/2014 PCP: Hoyle Sauer, MD  HPI: 81 y.o. male has a past medical history significant for COPD, coronary artery disease, hypertension, hyperlipidemia, severe dementia, is being brought from the nursing home with lethargy and decreased responsiveness. Found to have hypoxic respiratory failure.   Subjective / 24 H Interval events - sleeping this morning, wakes up to voice but would not answer questions  Assessment/Plan: Principal Problem:   HCAP (healthcare-associated pneumonia) Active Problems:   COPD exacerbation   Dementia   Hypertension   Acute exacerbation of CHF (congestive heart failure)   Respiratory failure with hypoxia   HCAP - chest x-ray with slight evidence of pneumonia - continue vancomycin and aztreonam - sputum and blood cultures pending - SLP to evaluate given concorrent dementia  Acute on chronic diastolic heart failure - no prior 2-D echo in the system - 2D echo 2/28 with EF 55% and grade 1 diastolic dysfunction - responded well to  IV diuresis, euvolemic this morning, hold Lasix today, start po tomorrow - BMP with good renal function, slight BUN elevation due to diuresis  - RBBB which was not present in 2014, troponin flat, likely demand.  - He is not a candidate for invasive testing.  COPD with exacerbation - possibly due to #1 and #2 - Antibiotics as above, DuoNeb, IV steroids, convert to prednisone tomorrow.   Acute on chronic hypoxic respiratory failure - likely multifactorial due to #1, #2 and #3  Dementia - at baseline  Goals of care - patient is DO NOT RESUSCITATE, have discussed with wife and he would not want intubation or resuscitation. If he is to clinically deteriorate despite medical management, she wants to ensure that he will be comfortable.    Diet: DIET SOFT Fluids: none DVT Prophylaxis: Lovenox  Code Status: DNR Family Communication: none bedside    Disposition Plan: SNF tomorrow  Consultants:  None   Procedures:  None    Antibiotics Vancomycin 2/27 >> Aztreonam 2/27 >>   Studies  Dg Chest Port 1 View  08/09/2014   CLINICAL DATA:  Shortness of breath and wheezing. Multiple diagnosis of pneumonia and bronchitis in the past.  EXAM: PORTABLE CHEST - 1 VIEW  COMPARISON:  07/29/2014  FINDINGS: Shallow inspiration. Normal heart size and pulmonary vascularity. Diffuse central interstitial pattern in the lungs may represent edema or interstitial pneumonia. Blunting of the costophrenic angle suggesting small pleural effusions. Calcified and tortuous aorta. Degenerative changes in the spine and shoulders.  IMPRESSION: Small bilateral pleural effusions. Central interstitial pattern suggest edema or interstitial pneumonia.   Electronically Signed   By: Burman Nieves M.D.   On: 08/09/2014 17:10    Objective  Filed Vitals:   08/10/14 2105 08/11/14 0508 08/11/14 0903 08/11/14 0909  BP: 108/58 115/71    Pulse: 88 74    Temp: 97.8 F (36.6 C) 97.8 F (36.6 C)    TempSrc: Oral Oral    Resp: 20 20    Height:      Weight:  78.1 kg (172 lb 2.9 oz)    SpO2: 95% 92% 92% 92%    Intake/Output Summary (Last 24 hours) at 08/11/14 1146 Last data filed at 08/11/14 0838  Gross per 24 hour  Intake 2173.33 ml  Output    650 ml  Net 1523.33 ml   Filed Weights   08/09/14 1706 08/10/14 0500 08/11/14 0508  Weight: 78.926 kg (174 lb) 80.6 kg (177 lb 11.1 oz) 78.1 kg (  172 lb 2.9 oz)    Exam:  General:  Sleeping, wakes up  HEENT: no scleral icterus  Cardiovascular: RRR without MRG, trace edema  Respiratory: scattered wheezing, improved  Abdomen: soft, non tender  MSK/Extremities: no clubbing/cyanosis  Skin: no rashes  Neuro: non focal   Data Reviewed: Basic Metabolic Panel:  Recent Labs Lab 08/09/14 1651 08/10/14 0656 08/11/14 0820  NA 140 137 139  K 3.9 3.4* 4.1  CL 104 98 104  CO2 29 31 29   GLUCOSE 132* 182* 178*   BUN 18 18 25*  CREATININE 0.93 0.82 1.06  CALCIUM 8.6 8.0* 8.4   Liver Function Tests:  Recent Labs Lab 08/09/14 1651 08/10/14 0656  AST 23 19  ALT 17 13  ALKPHOS 106 87  BILITOT 1.0 0.6  PROT 6.9 6.0  ALBUMIN 3.3* 2.7*   CBC:  Recent Labs Lab 08/09/14 1651 08/10/14 0656  WBC 9.8 4.6  NEUTROABS 8.3*  --   HGB 12.7* 11.8*  HCT 40.8 36.9*  MCV 88.3 86.6  PLT 250 236   Cardiac Enzymes:  Recent Labs Lab 08/09/14 1649 08/10/14 0013 08/10/14 0656 08/11/14 0820  TROPONINI <0.03 0.13* 0.23* 0.10*   BNP (last 3 results)  Recent Labs  07/10/14 1445 08/09/14 1650  BNP 67.9 41.5    ProBNP (last 3 results)  Recent Labs  04/12/14 1048  PROBNP 616.1*    CBG: No results for input(s): GLUCAP in the last 168 hours.  Recent Results (from the past 240 hour(s))  Blood Culture (routine x 2)     Status: None (Preliminary result)   Collection Time: 08/09/14  4:37 PM  Result Value Ref Range Status   Specimen Description BLOOD RIGHT ANTECUBITAL  Final   Special Requests BOTTLES DRAWN AEROBIC AND ANAEROBIC 5CC EACH  Final   Culture   Final           BLOOD CULTURE RECEIVED NO GROWTH TO DATE CULTURE WILL BE HELD FOR 5 DAYS BEFORE ISSUING A FINAL NEGATIVE REPORT Performed at 08/11/14    Report Status PENDING  Incomplete  Blood Culture (routine x 2)     Status: None (Preliminary result)   Collection Time: 08/09/14  4:51 PM  Result Value Ref Range Status   Specimen Description BLOOD LEFT ANTECUBITAL  Final   Special Requests BOTTLES DRAWN AEROBIC AND ANAEROBIC 5CC EACH  Final   Culture   Final           BLOOD CULTURE RECEIVED NO GROWTH TO DATE CULTURE WILL BE HELD FOR 5 DAYS BEFORE ISSUING A FINAL NEGATIVE REPORT Performed at 08/11/14    Report Status PENDING  Incomplete  MRSA PCR Screening     Status: None   Collection Time: 08/09/14  7:32 PM  Result Value Ref Range Status   MRSA by PCR NEGATIVE NEGATIVE Final    Comment:        The  GeneXpert MRSA Assay (FDA approved for NASAL specimens only), is one component of a comprehensive MRSA colonization surveillance program. It is not intended to diagnose MRSA infection nor to guide or monitor treatment for MRSA infections.      Scheduled Meds: . antiseptic oral rinse  7 mL Mouth Rinse q12n4p  . aspirin  325 mg Oral Daily  . aztreonam  1 g Intravenous 3 times per day  . budesonide  0.25 mg Nebulization BID  . chlorhexidine  15 mL Mouth Rinse BID  . divalproex  250 mg Oral BID  .  donepezil  20 mg Oral QHS  . enoxaparin (LOVENOX) injection  40 mg Subcutaneous Q24H  . guaiFENesin  600 mg Oral BID  . ipratropium-albuterol  3 mL Nebulization TID  . loratadine  10 mg Oral Daily  . methylPREDNISolone (SOLU-MEDROL) injection  60 mg Intravenous 4 times per day  . pravastatin  40 mg Oral QHS  . sodium chloride  3 mL Intravenous Q12H  . vancomycin  1,000 mg Intravenous Q12H   Continuous Infusions: . dextrose 5 % and 0.45% NaCl 50 mL/hr at 08/10/14 1749    Pamella Pert, MD [facility] Pager [phone]. If 7 PM - 7 AM, please contact night-coverage at [email or web address], password [facility] 08/11/2014, 11:46 AM  LOS: 2 days

## 2014-08-11 NOTE — Progress Notes (Signed)
Clinical Social Work Department BRIEF PSYCHOSOCIAL ASSESSMENT 08/11/2014  Patient:  [name] [name], [name] [name]     Account Number:  [id number]     Admit date:  08/09/2014  Clinical Social Worker:  Orpah Greek  Date/Time:  08/11/2014 03:27 PM  Referred by:  Physician  Date Referred:  08/11/2014 Referred for  Other - See comment   Other Referral:   Admitted from: [facility]   Interview type:  Patient Other interview type:   and wife, [name] [name] & daughter, [name] [name] at bedside    PSYCHOSOCIAL DATA Living Status:  FACILITY Admitted from facility:  [facility] Level of care:  Skilled Nursing Facility Primary support name:  [name] [name] (wife) h#: [phone] c#: [phone] Primary support relationship to patient:  SPOUSE Degree of support available:   good    CURRENT CONCERNS Current Concerns  Post-Acute Placement   Other Concerns:    SOCIAL WORK ASSESSMENT / PLAN CSW received consult that patient was admitted from [facility].   Assessment/plan status:  Information/Referral to [facility] Other assessment/ plan:   Information/referral to community resources:   CSW completed FL2 and sent information to [place], confirmed with Wille Celeste at [facility] that they would be able to take patient back pending bed availability.    PATIENT'S/FAMILY'S RESPONSE TO PLAN OF CARE: Patient & wife seemed agreeable with plan to return to [place] at discharge, but stated that they would like to doctor's input. Patient plans to return home with wife once done with rehab. CSW encouraged wife & daughter to get in touch with case manager at [facility] to inquire about aid & assistance once patient does return home. Wife was very appreciative and expressed that she hopes that returning to [place] would be very short-term.          Lincoln Maxin, LCSW [facility] Clinical Social Worker cell #: [phone]

## 2014-08-11 NOTE — Progress Notes (Signed)
Pt has not voided since pm yesterday. Bladder scanned pt this morning, he had over 500 ml of urine in bladder. Received orders from PA on call to I & O cath, was unsuccessful first attempt. Pt was c/o of a lot of pain. Gave report to day RN, will try again later.

## 2014-08-11 NOTE — Progress Notes (Signed)
PT Cancellation Note  Patient Details Name: LARIN WEISSBERG MRN: [id number] DOB: 04/28/1932   Cancelled Treatment:    Reason Eval/Treat Not Completed: Attempted PT eval-pt declined to participate at this time. Pt requested PT check back later today. Will check back as schedule permits.    Rebeca Alert, MPT Pager: [phone]

## 2014-08-11 NOTE — Evaluation (Signed)
Clinical/Bedside Swallow Evaluation Patient Details  Name: [name] [name] MRN: [id number] Date of Birth: 09-05-1931  Today's Date: 08/11/2014 Time: SLP Start Time (ACUTE ONLY): 1520 SLP Stop Time (ACUTE ONLY): 1540 SLP Time Calculation (min) (ACUTE ONLY): 20 min  Past Medical History:  Past Medical History  Diagnosis Date  . Peripheral vascular disease   . Osteoarthritis   . Hypertension   . Hyperlipidemia   . CAD (coronary artery disease)     Non stemi 3/11; PTCA first OM 3/11  . COPD (chronic obstructive pulmonary disease)   . History of colonic polyps   . Dementia   . Back pain   . Rheumatoid arthritis(714.0) 2013    [name]    Past Surgical History:  Past Surgical History  Procedure Laterality Date  . Iliac artery stent      Stenting of left common iliac and external iliac  . Coronary angioplasty  08/2009    First OM   HPI:  81 year old male admitted 08/09/14 due to lethargy and decreased responsiveness. PMH significant for COPD, CAD, HTN, HLD, severe dementia. BSE ordered to evaluate swallow function and safety.   Assessment / Plan / Recommendation Clinical Impression  Pt exhibits no overt s/s aspiration with any consistency tested. Wife reports pt's dentures are ill-fitting, however, they appear to be ok at this time. Pt maintains he has no swallowing difficulty, but given dx COPD which increases risk of silent aspiration, will proceed with MBS next date to objectively assess swallow fucntion and safety. Will continue soft diet with thin liquids for today.    Aspiration Risk  Mild    Diet Recommendation Dysphagia 3 (Mechanical Soft);Thin liquid   Liquid Administration via: Straw Medication Administration: Whole meds with liquid Supervision: Staff to assist with self feeding;Full supervision/cueing for compensatory strategies Compensations: Slow rate;Small sips/bites Postural Changes and/or Swallow Maneuvers: Seated upright 90 degrees;Upright 30-60 min  after meal    Other  Recommendations Recommended Consults: MBS Oral Care Recommendations: Oral care BID Other Recommendations: Clarify dietary restrictions   Follow Up Recommendations   (TBD)    Frequency and Duration  (pending MBS results)      Pertinent Vitals/Pain No pain reported    SLP Swallow Goals  pending MBS results   Swallow Study Prior Functional Status   No reported history of swallowing difficulty, however, pt has dx dementia and COPD, which increase risk of dysphagia.    General Date of Onset: 08/09/14 HPI: 81 year old male admitted 08/09/14 due to lethargy and decreased responsiveness. PMH significant for COPD, CAD, HTN, HLD, severe dementia. BSE ordered to evaluate swallow function and safety. Type of Study: Bedside swallow evaluation Previous Swallow Assessment: none found Diet Prior to this Study: Dysphagia 3 (soft);Thin liquids Temperature Spikes Noted: No Respiratory Status: Nasal cannula History of Recent Intubation: No Behavior/Cognition: Alert;Cooperative;Decreased sustained attention;Distractible Oral Cavity - Dentition: Dentures, bottom;Dentures, top Self-Feeding Abilities: Able to feed self Patient Positioning: Upright in chair Baseline Vocal Quality: Clear Volitional Cough: Strong Volitional Swallow: Able to elicit    Oral/Motor/Sensory Function Overall Oral Motor/Sensory Function: Appears within functional limits for tasks assessed   Ice Chips Ice chips: Not tested   Thin Liquid Thin Liquid: Within functional limits Presentation: Straw    Nectar Thick Nectar Thick Liquid: Not tested   Honey Thick Honey Thick Liquid: Not tested   Puree Puree: Within functional limits Presentation: Spoon   Solid   GO   Shanequa Whitenight B. Murvin [facility], CCC-SLP [phone] Solid: Within functional  limits Presentation: Self Fed       Leigh Aurora 08/11/2014,3:46 PM

## 2014-08-12 ENCOUNTER — Inpatient Hospital Stay (HOSPITAL_COMMUNITY): Payer: Medicare Other

## 2014-08-12 LAB — BASIC METABOLIC PANEL
Anion gap: 6 (ref 5–15)
BUN: 38 mg/dL — ABNORMAL HIGH (ref 6–23)
CO2: 30 mmol/L (ref 19–32)
Calcium: 8.3 mg/dL — ABNORMAL LOW (ref 8.4–10.5)
Chloride: 105 mmol/L (ref 96–112)
Creatinine, Ser: 0.98 mg/dL (ref 0.50–1.35)
GFR calc Af Amer: 88 mL/min — ABNORMAL LOW (ref >90)
GFR, EST NON AFRICAN AMERICAN: 76 mL/min — AB (ref >90)
Glucose, Bld: 129 mg/dL — ABNORMAL HIGH (ref 70–99)
POTASSIUM: 4.2 mmol/L (ref 3.5–5.1)
SODIUM: 141 mmol/L (ref 135–145)

## 2014-08-12 LAB — VANCOMYCIN, TROUGH: Vancomycin Tr: 18.2 ug/mL (ref 10.0–20.0)

## 2014-08-12 MED ORDER — FUROSEMIDE 10 MG/ML IJ SOLN
40.0000 mg | Freq: Once | INTRAMUSCULAR | Status: AC
  Administered 2014-08-12: 40 mg via INTRAVENOUS
  Filled 2014-08-12: qty 4

## 2014-08-12 MED ORDER — IPRATROPIUM-ALBUTEROL 0.5-2.5 (3) MG/3ML IN SOLN: 3.0000 mL | RESPIRATORY_TRACT | Status: AC

## 2014-08-12 MED ORDER — LEVOFLOXACIN IN D5W 750 MG/150ML IV SOLN
750.0000 mg | INTRAVENOUS | Status: DC
  Administered 2014-08-12 – 2014-08-15 (×4): 750 mg via INTRAVENOUS
  Filled 2014-08-12 (×4): qty 150

## 2014-08-12 MED ORDER — FUROSEMIDE 10 MG/ML IJ SOLN
20.0000 mg | Freq: Once | INTRAMUSCULAR | Status: AC
  Administered 2014-08-12: 20 mg via INTRAVENOUS
  Filled 2014-08-12: qty 2

## 2014-08-12 NOTE — Progress Notes (Signed)
PROGRESS NOTE  [name] [name] WJX:[id number] DOB: 26-Dec-1931 DOA: 08/09/2014 PCP: Hoyle Sauer, MD  HPI: 81 y.o. male has a past medical history significant for COPD, coronary artery disease, hypertension, hyperlipidemia, severe dementia, is being brought from the nursing home with lethargy and decreased responsiveness. Found to have hypoxic respiratory failure.   Subjective / 24 H Interval events - initially looked good this morning however with increased wheezing later in the day   Assessment/Plan: Principal Problem:   HCAP (healthcare-associated pneumonia) Active Problems:   COPD exacerbation   Dementia   Hypertension   Acute exacerbation of CHF (congestive heart failure)   Respiratory failure with hypoxia   HCAP - chest x-ray with evidence of pneumonia - continue vancomycin and aztreonam, transition to Levofloxacin 3/1 - sputum and blood cultures pending, negative to date - SLP evaluated, MBS, recommending dysphagia 3 with thin liquid  Acute on chronic diastolic heart failure - no prior 2-D echo in the system - 2D echo 2/28 with EF 55% and grade 1 diastolic dysfunction - responded well to  IV diuresis initially, started on po Lasix 2/28 however weight up 3/1, net positive based on I&O, with increased wheezing, repeat IV Lasix today - BMP with good renal function, slight BUN elevation due to diuresis  - RBBB which was not present in 2014, troponin flat, likely demand.  - He is not a candidate for invasive testing.  COPD with exacerbation - possibly due to #1 and #2 - Antibiotics as above, DuoNeb, IV steroids continue for now given persistent wheezing  Acute on chronic hypoxic respiratory failure - likely multifactorial due to #1, #2 and #3  Dementia - at baseline  Goals of care - patient is DO NOT RESUSCITATE, have discussed with wife and he would not want intubation or resuscitation. If he is to clinically deteriorate despite medical management, she wants to  ensure that he will be comfortable.  Diet: DIET SOFT Fluids: none DVT Prophylaxis: Lovenox  Code Status: DNR Family Communication: none bedside  Disposition Plan: SNF tomorrow  Consultants:  None   Procedures:  None    Antibiotics Vancomycin 2/27 >> 3/1 Aztreonam 2/27 >> 3/1 Levofloxacin 3/1 >>   Studies  Dg Swallowing Func-speech Pathology  08/12/2014   CLINICAL DATA:  Aspiration after colon cancer surgery. History of tracheostomy.  EXAM: MODIFIED BARIUM SWALLOW  TECHNIQUE: Different consistencies of barium were administered orally to the patient by the Speech Pathologist. Imaging of the pharynx was performed in the lateral projection.  FLUOROSCOPY TIME:  Fluoroscopy Time:  2 minutes 23 seconds  COMPARISON:  None.  FINDINGS: Thin liquid- aspiration  Nectar thick liquid- flash penetration and aspiration noted  Honey- aspiration noted.  Puree flash penetration, aspiration, and retention noted.  Cracker-not assessed  Puree with cracker- not assessed  Barium tablet -  not assessed  IMPRESSION: 1. Aspiration noted with thin liquid, nectar thick liquids, honey, and Puree  Please refer to the Speech Pathologists report for complete details and recommendations.   Electronically Signed   By: Signa Kell M.D.   On: 08/12/2014 14:01    Objective  Filed Vitals:   08/11/14 2210 08/12/14 0600 08/12/14 0931 08/12/14 0936  BP: 123/69     Pulse: 81     Temp: 97.5 F (36.4 C)     TempSrc: Oral     Resp: 18     Height:      Weight:  80.1 kg (176 lb 9.4 oz)    SpO2: 96%  91%  91%    Intake/Output Summary (Last 24 hours) at 08/12/14 1435 Last data filed at 08/12/14 1000  Gross per 24 hour  Intake 1916.67 ml  Output    900 ml  Net 1016.67 ml   Filed Weights   08/10/14 0500 08/11/14 0508 08/12/14 0600  Weight: 80.6 kg (177 lb 11.1 oz) 78.1 kg (172 lb 2.9 oz) 80.1 kg (176 lb 9.4 oz)   Exam:  General:  Sleeping, wakes up  HEENT: no scleral icterus  Cardiovascular: RRR without  MRG, trace edema  Respiratory: scattered wheezing, improved  Abdomen: soft, non tender  MSK/Extremities: no clubbing/cyanosis  Skin: no rashes  Neuro: non focal   Data Reviewed: Basic Metabolic Panel:  Recent Labs Lab 08/09/14 1651 08/10/14 0656 08/11/14 0820 08/12/14 0508  NA 140 137 139 141  K 3.9 3.4* 4.1 4.2  CL 104 98 104 105  CO2 29 31 29 30   GLUCOSE 132* 182* 178* 129*  BUN 18 18 25* 38*  CREATININE 0.93 0.82 1.06 0.98  CALCIUM 8.6 8.0* 8.4 8.3*   Liver Function Tests:  Recent Labs Lab 08/09/14 1651 08/10/14 0656  AST 23 19  ALT 17 13  ALKPHOS 106 87  BILITOT 1.0 0.6  PROT 6.9 6.0  ALBUMIN 3.3* 2.7*   CBC:  Recent Labs Lab 08/09/14 1651 08/10/14 0656  WBC 9.8 4.6  NEUTROABS 8.3*  --   HGB 12.7* 11.8*  HCT 40.8 36.9*  MCV 88.3 86.6  PLT 250 236   Cardiac Enzymes:  Recent Labs Lab 08/09/14 1649 08/10/14 0013 08/10/14 0656 08/11/14 0820  TROPONINI <0.03 0.13* 0.23* 0.10*   BNP (last 3 results)  Recent Labs  07/10/14 1445 08/09/14 1650  BNP 67.9 41.5    ProBNP (last 3 results)  Recent Labs  04/12/14 1048  PROBNP 616.1*   Recent Results (from the past 240 hour(s))  Blood Culture (routine x 2)     Status: None (Preliminary result)   Collection Time: 08/09/14  4:37 PM  Result Value Ref Range Status   Specimen Description BLOOD RIGHT ANTECUBITAL  Final   Special Requests BOTTLES DRAWN AEROBIC AND ANAEROBIC 5CC EACH  Final   Culture   Final           BLOOD CULTURE RECEIVED NO GROWTH TO DATE CULTURE WILL BE HELD FOR 5 DAYS BEFORE ISSUING A FINAL NEGATIVE REPORT Performed at 08/11/14    Report Status PENDING  Incomplete  Blood Culture (routine x 2)     Status: None (Preliminary result)   Collection Time: 08/09/14  4:51 PM  Result Value Ref Range Status   Specimen Description BLOOD LEFT ANTECUBITAL  Final   Special Requests BOTTLES DRAWN AEROBIC AND ANAEROBIC 5CC EACH  Final   Culture   Final           BLOOD  CULTURE RECEIVED NO GROWTH TO DATE CULTURE WILL BE HELD FOR 5 DAYS BEFORE ISSUING A FINAL NEGATIVE REPORT Performed at 08/11/14    Report Status PENDING  Incomplete  MRSA PCR Screening     Status: None   Collection Time: 08/09/14  7:32 PM  Result Value Ref Range Status   MRSA by PCR NEGATIVE NEGATIVE Final    Comment:        The GeneXpert MRSA Assay (FDA approved for NASAL specimens only), is one component of a comprehensive MRSA colonization surveillance program. It is not intended to diagnose MRSA infection nor to guide or monitor treatment for MRSA infections.  Urine culture     Status: None   Collection Time: 08/09/14  7:33 PM  Result Value Ref Range Status   Specimen Description URINE, CLEAN CATCH  Final   Special Requests NONE  Final   Colony Count   Final    8,000 COLONIES/ML Performed at [facility]    Culture   Final    INSIGNIFICANT GROWTH Performed at [facility]    Report Status 08/11/2014 FINAL  Final     Scheduled Meds: . antiseptic oral rinse  7 mL Mouth Rinse q12n4p  . aspirin  325 mg Oral Daily  . aztreonam  1 g Intravenous 3 times per day  . budesonide  0.25 mg Nebulization BID  . chlorhexidine  15 mL Mouth Rinse BID  . divalproex  250 mg Oral BID  . donepezil  20 mg Oral QHS  . enoxaparin (LOVENOX) injection  40 mg Subcutaneous Q24H  . furosemide  20 mg Oral Daily  . guaiFENesin  600 mg Oral BID  . ipratropium-albuterol  3 mL Nebulization TID  . loratadine  10 mg Oral Daily  . methylPREDNISolone (SOLU-MEDROL) injection  60 mg Intravenous 4 times per day  . pravastatin  40 mg Oral QHS  . sodium chloride  3 mL Intravenous Q12H  . vancomycin  1,000 mg Intravenous Q12H   Continuous Infusions:    Pamella Pert, MD [facility] Pager [phone]. If 7 PM - 7 AM, please contact night-coverage at [email or web address], password [facility] 08/12/2014, 2:35 PM  LOS: 3 days

## 2014-08-12 NOTE — Progress Notes (Signed)
ANTIBIOTIC CONSULT NOTE-FOLLOW-UP  Pharmacy Consult for vancomycin/aztreonam Indication: HCAP  Allergies  Allergen Reactions  . Iohexol Shortness Of Breath and Other (See Comments)     Desc: Increased difficulty breathing, Onset Date: [id number]   . Zolpidem Tartrate Other (See Comments)    REACTION: Acute delirium  . Penicillins Other (See Comments)    unknown    Patient Measurements: Height: 5\' 7"  (170.2 cm) Weight: 176 lb 9.4 oz (80.1 kg) IBW/kg (Calculated) : 66.1  Assessment: 82 y/oM presents 2/27 from NH with shortness of breath. PMH includes COPD, CAD, severe dementia. CXR reveals BL edema vs pneumonia. Code sepsis initiated w/ Vancomycin and Aztreonam ordered per pharmacy.PCN allergy listed with unknown reaction.    Antiinfectives  2/27 >> vancomycin >> 2/27 >> aztreonam >>   Labs / vitals Tmax: remains afebrile WBCs: WNL (on 2/28) Renal: SCr 0.98, CrCl ~ 61 mL/min CG   Microbiology 2/27 blood x2: NGTD 2/27 urine: 8K insignificant growth 2/27 MRSA PCR: negative 2/27 Legionella Ur Ag: negative 2/27 S pneumo Ur Ag: negative 2/27 flu PCR: negative  Levels / dose change info 2/28: vancomycin increased to 1g q12h with improved renal function 3/1:  0508 VT = 18.2 mcg/mL on 1g q12h, therapeutic  Goal of Therapy:  Vancomycin trough level 15-20 mcg/ml  Appropriate antibiotic dosing for renal function and indication Eradication of infection  Plan:  Day #4 Vancomycin/Aztreonam  Continue Vancomycin 1g IV q12h, as trough level this AM is therapeutic.   Continue Aztreonam 1g IV q8h.  Continue to monitor renal function, cultures, clinical course.  Thank you for the consult.   3/28, PharmD, BCPS Pager: [phone] 08/12/2014 7:47 AM

## 2014-08-12 NOTE — Progress Notes (Signed)
Pt congested, wheezing throughout lung fields, SOB restless---sats 92-93% in 4l. Pt yelling "I can't breathe please help me". MD called orders received. Will continue to follow and reassure patient. SRP RN

## 2014-08-12 NOTE — Progress Notes (Signed)
SLP MBS completed. Full report to follow.  Pt without aspiration or deep laryngeal penetration of any consistency tested.  Oral transit delays noted due to pt's dementia.  Pharyngeal swallow was strong without significant residuals.    Pt did appear to have barium tablet given with [place] in mid=esophagus without awareness.  Liquid swallow aided clearance of tablet but with appearance of backflow of liquid to proximal esophagus.  Suspect multifactorial risk for aspiration including timing of swallow/respirations and esophageal component.   Pt noted to belch after completion of MBS but denied sensing backflow or reflux.    Recommend continue soft diet/thin liquid with strict precautions. Start meals with liquids *room temp or warm Consume liquids throughout meal Stay upright at least 30 minutes after meals Stop and rest if dyspneic or coughing  Donavan Burnet, MS [facility] SLP [phone]

## 2014-08-12 NOTE — Procedures (Addendum)
Objective Swallowing Evaluation: Modified Barium Swallowing Study  Patient Details  Name: [name] [name] MRN: [id number] Date of Birth: 1932-04-10  Today's Date: 08/12/2014 Time: SLP Start Time (ACUTE ONLY): 1219-SLP Stop Time (ACUTE ONLY): 1245 SLP Time Calculation (min) (ACUTE ONLY): 26 min  Past Medical History:  Past Medical History  Diagnosis Date  . Peripheral vascular disease   . Osteoarthritis   . Hypertension   . Hyperlipidemia   . CAD (coronary artery disease)     Non stemi 3/11; PTCA first OM 3/11  . COPD (chronic obstructive pulmonary disease)   . History of colonic polyps   . Dementia   . Back pain   . Rheumatoid arthritis(714.0) 2013    [name]    Past Surgical History:  Past Surgical History  Procedure Laterality Date  . Iliac artery stent      Stenting of left common iliac and external iliac  . Coronary angioplasty  08/2009    First OM   HPI:  HPI: 81 year old male admitted 08/09/14 due to lethargy and decreased responsiveness. PMH significant for COPD, CAD, HTN, HLD, severe dementia. BSE completed with recommendations for MBS to rule out silent aspiration.    No Data Recorded  Assessment / Plan / Recommendation Pt without aspiration or deep laryngeal penetration of any consistency tested.  Oral transit delays noted due to pt's dementia.  Pharyngeal swallow was strong without significant residuals.    Pt did appear to have barium tablet given with pudding lodge in mid=esophagus without awareness.  Liquid swallow aided clearance of tablet but with appearance of backflow of liquid to proximal esophagus.  Suspect multifactorial risk for aspiration including timing of swallow/respirations and esophageal component.   Pt noted to belch after completion of MBS but denied sensing backflow or reflux.    Recommend continue soft diet/thin liquid with strict precautions. Start meals with liquids *room temp or warm Consume liquids throughout meal Stay upright  at least 30 minutes after meals Stop and rest if dyspneic or coughing      CHL IP TREATMENT RECOMMENDATION 08/12/2014  Treatment Plan Recommendations Therapy as outlined in treatment plan below     CHL IP DIET RECOMMENDATION 08/12/2014  Diet Recommendations Dysphagia 3 (Mechanical Soft);Thin liquid  Liquid Administration via Straw;Cup  Medication Administration Whole meds with puree  Compensations Slow rate;Small sips/bites  Postural Changes and/or Swallow Maneuvers Seated upright 90 degrees;Upright 30-60 min after meal     CHL IP OTHER RECOMMENDATIONS 08/12/2014  Recommended Consults (None)  Oral Care Recommendations Oral care BID  Other Recommendations Clarify dietary restrictions     CHL IP FOLLOW UP RECOMMENDATIONS 08/12/2014  Follow up Recommendations Skilled Nursing facility     [facility] IP FREQUENCY AND DURATION 08/12/2014  Speech Therapy Frequency (ACUTE ONLY) min 1 x/week  Treatment Duration 1 week         CHL IP REASON FOR REFERRAL 08/12/2014  Reason for Referral Objectively evaluate swallowing function     CHL IP ORAL PHASE 08/12/2014                                         Oral - Nectar Cup Delayed oral transit;Weak lingual manipulation;Reduced posterior propulsion;Lingual/palatal residue  Oral - Nectar Straw (None)  Oral - Nectar Syringe (None)  Oral - Ice Chips (None)  Oral - Thin Teaspoon (None)  Oral - Thin Cup Delayed oral transit;Weak lingual  manipulation;Reduced posterior propulsion;Lingual/palatal residue  Oral - Thin Straw Delayed oral transit;Weak lingual manipulation;Reduced posterior propulsion;Lingual/palatal residue  Oral - Thin Syringe (None)  Oral - Puree Delayed oral transit;Weak lingual manipulation;Reduced posterior propulsion  Oral - Mechanical Soft (None)  Oral - Regular Delayed oral transit;Weak lingual manipulation;Reduced posterior propulsion  Oral - Multi-consistency (None)  Oral - Pill Delayed oral transit;Weak lingual manipulation;Reduced  posterior propulsion  Oral Phase - Comment (None)      CHL IP PHARYNGEAL PHASE 08/12/2014  Pharyngeal Phase Impaired                                      Pharyngeal - Nectar Cup Premature spillage to valleculae                             Pharyngeal - Thin Cup Penetration/Aspiration during swallow;Premature spillage to valleculae  Penetration/Aspiration details (thin cup) Material enters airway, remains ABOVE vocal cords and not ejected out  Pharyngeal - Thin Straw Penetration/Aspiration during swallow;Premature spillage to valleculae  Penetration/Aspiration details (thin straw) Material enters airway, remains ABOVE vocal cords and not ejected out        Pharyngeal - Puree Premature spillage to valleculae;Pharyngeal residue - cp segment           Pharyngeal - Regular Premature spillage to valleculae           Pharyngeal - Pill Premature spillage to valleculae           CHL IP CERVICAL ESOPHAGEAL PHASE 08/12/2014  Cervical Esophageal Phase Impaired                    Nectar Cup Reduced cricopharyngeal relaxation           Thin Cup Reduced cricopharyngeal relaxation        Cervical Esophageal Comment barium tablet given with pudding appeared to lodge at midesophagus without awareness, thin barium given to aid clearance appeared effective but appeared to backflow to proximal esophagus, water swallow helpful            Donavan Burnet, MS [facility] SLP [phone]

## 2014-08-12 NOTE — Progress Notes (Signed)
ANTIBIOTIC CONSULT NOTE  Pharmacy Consult for Levofloxacin Indication: HCAP  Allergies  Allergen Reactions  . Iohexol Shortness Of Breath and Other (See Comments)     Desc: Increased difficulty breathing, Onset Date: [id number]   . Zolpidem Tartrate Other (See Comments)    REACTION: Acute delirium  . Penicillins Other (See Comments)    unknown    Patient Measurements: Height: 5\' 7"  (170.2 cm) Weight: 176 lb 9.4 oz (80.1 kg) IBW/kg (Calculated) : 66.1  Assessment: 82 y/oM presents 2/27 from NH with shortness of breath. PMH includes COPD, CAD, severe dementia. CXR reveals BL edema vs pneumonia. Code sepsis initiated w/ Vancomycin and Aztreonam ordered per pharmacy.PCN allergy listed with unknown reaction.Antibiotics transitioned to Levofloxacin monotherapy today, with pharmacy requested to assist with dosing.  Antiinfectives  2/27 >> vancomycin >> 3/1 2/27 >> aztreonam >>3/1 3/1 >> levofloxacin >>  Labs / vitals Tmax: remains afebrile WBCs: WNL (on 2/28) Renal: SCr 0.98, CrCl ~ 61 mL/min CG   Microbiology 2/27 blood x2: NGTD 2/27 urine: 8K insignificant growth 2/27 MRSA PCR: negative 2/27 Legionella Ur Ag: negative 2/27 S pneumo Ur Ag: negative 2/27 flu PCR: negative  Goal of Therapy:  Appropriate antibiotic dosing for renal function and indication Eradication of infection  Plan:   Levofloxacin 750 mg IV q24h.  Continue to monitor renal function, cultures, clinical course.  Thank you for the consult.   3/27, PharmD, BCPS Pager: [phone] 08/12/2014 2:49 PM

## 2014-08-13 LAB — CBC
HCT: 37.9 % — ABNORMAL LOW (ref 39.0–52.0)
HEMOGLOBIN: 11.7 g/dL — AB (ref 13.0–17.0)
MCH: 27.2 pg (ref 26.0–34.0)
MCHC: 30.9 g/dL (ref 30.0–36.0)
MCV: 88.1 fL (ref 78.0–100.0)
Platelets: 235 10*3/uL (ref 150–400)
RBC: 4.3 MIL/uL (ref 4.22–5.81)
RDW: 13.8 % (ref 11.5–15.5)
WBC: 14 10*3/uL — ABNORMAL HIGH (ref 4.0–10.5)

## 2014-08-13 LAB — BASIC METABOLIC PANEL
Anion gap: 5 (ref 5–15)
BUN: 47 mg/dL — AB (ref 6–23)
CALCIUM: 8.1 mg/dL — AB (ref 8.4–10.5)
CO2: 35 mmol/L — ABNORMAL HIGH (ref 19–32)
CREATININE: 1.27 mg/dL (ref 0.50–1.35)
Chloride: 102 mmol/L (ref 96–112)
GFR calc non Af Amer: 52 mL/min — ABNORMAL LOW (ref >90)
GFR, EST AFRICAN AMERICAN: 60 mL/min — AB (ref >90)
Glucose, Bld: 150 mg/dL — ABNORMAL HIGH (ref 70–99)
Potassium: 3.9 mmol/L (ref 3.5–5.1)
Sodium: 142 mmol/L (ref 135–145)

## 2014-08-13 MED ORDER — ALBUTEROL SULFATE (2.5 MG/3ML) 0.083% IN NEBU
2.5000 mg | INHALATION_SOLUTION | RESPIRATORY_TRACT | Status: DC | PRN
  Administered 2014-08-13: 2.5 mg via RESPIRATORY_TRACT
  Filled 2014-08-13: qty 3

## 2014-08-13 MED ORDER — METHYLPREDNISOLONE SODIUM SUCC 40 MG IJ SOLR
40.0000 mg | Freq: Two times a day (BID) | INTRAMUSCULAR | Status: DC
  Administered 2014-08-13: 40 mg via INTRAVENOUS
  Filled 2014-08-13: qty 1

## 2014-08-13 NOTE — Progress Notes (Signed)
Pt has improved after meds and treatment, decreased wheezing, pt more alert and talking. Urine output--1100cc after Lasix 40 mg given. VSs. Will cont to monitor. SRP, RN

## 2014-08-13 NOTE — Progress Notes (Addendum)
Patient ID: [name] [name], male   DOB: 02-Sep-1931, 81 y.o.   MRN: [id number]  [facility] PROGRESS NOTE  [name] [name] ASN:[id number] DOB: 1931-12-01 DOA: 08/09/2014 PCP: Hoyle Sauer, MD   Brief narrative:    81 y.o. male has a past medical history significant for COPD, coronary artery disease, hypertension, hyperlipidemia, severe dementia, is being brought from the nursing home with lethargy and decreased responsiveness. Found to have hypoxic respiratory failure.   Assessment/Plan:    Principal Problem:   Acute encephalopathy - secondary to HCAP and acute COPD, now resolved    Acute hypoxic respiratory failure secondary to HCAP (healthcare-associated pneumonia), multilobar  - pt still with wheezing on exam this AM but clinically stable and reports feeling better - continue Levaquin, BD's scheduled and as needed - high risk aspiration, soft diet per SLP evaluation  - taper down steroids  Active Problems:   Acute on COPD exacerbation - still with wheezing on expiration - continue BD's as noted above, taper down solumedrol  - try to taper off oxygen if possible    Dementia - stable    Hypertension - reasonable inpatient control    Severe PCM - in the context of acute on chronic illness - improved oral intake - soft diet recommended with aspiration precautions    Leukocytosis - likely form PNA and steroid induced  - CBC in AM   Acute on chronic diastolic heart failure  - no prior 2-D echo in the system - 2D echo 2/28 with EF 55% and grade 1 diastolic dysfunction - responded well to IV diuresis initially, started on po Lasix 2/28 however weight up 3/1, net positive based on I&O - BMP with good renal function, slight BUN elevation due to diuresis  - RBBB which was not present in 2014, troponin flat, likely demand.  - He is not a candidate for invasive testing. - weight is stable at 176 lbs this AM  Code Status: DNR Family Communication:  plan of care  discussed with the patient Disposition Plan: SNF  IV access:  Peripheral IV  Procedures and diagnostic studies:    Dg Chest 2 View   07/29/2014   Bibasilar infiltrates consistent with pneumonia.  2.  Bibasilar atelectasis and scarring.     Dg Chest 2 View  07/15/2014   Partial but incomplete clearing of right middle lobe infiltrate. No new opacity. Underlying emphysema. No change in cardiac silhouette.     Dg Chest Port 1 View   08/12/2014  Diffuse interstitial prominence compatible with interstitial pulmonary edema. Scarring at the RIGHT base with probable small residual RIGHT pleural effusion.   Dg Chest Port 1 View  08/09/2014  Small bilateral pleural effusions. Central interstitial pattern suggest edema or interstitial pneumonia.     Dg Swallowing Func-speech Pathology  08/12/2014   Aspiration noted with thin liquid, nectar thick liquids, honey, and Puree   Medical Consultants:  None  Other Consultants:  SLP PT  IAnti-Infectives:   Vancomycin 2/27 >> 3/1 Aztreonam 2/27 >> 3/1 Levofloxacin 3/1 >>  Debbora Presto, MD  [facility] Pager [phone]  If 7PM-7AM, please contact night-coverage [email or web address] Password [email or web address] 08/13/2014, 4:02 PM   LOS: 4 days   HPI/Subjective: No events overnight.   Objective: Filed Vitals:   08/12/14 2253 08/13/14 0518 08/13/14 0750 08/13/14 1506  BP: 122/61 137/65  139/77  Pulse: 84 64  83  Temp: 98.2 F (36.8 C) 98 F (36.7 C)  98.4 F (36.9 C)  TempSrc: Oral Oral  Oral  Resp: 20 20  20   Height:      Weight:  79.9 kg (176 lb 2.4 oz)    SpO2: 92% 91% 91% 93%    Intake/Output Summary (Last 24 hours) at 08/13/14 1602 Last data filed at 08/13/14 1500  Gross per 24 hour  Intake    930 ml  Output   1850 ml  Net   -920 ml    Exam:   General:  Pt is alert, follows commands appropriately, not in acute distress  Cardiovascular: Regular rate and rhythm, no rubs, no gallops  Respiratory: exp wheezing, no crackles, no rhonchi  Abdomen:  Soft, non tender, non distended, bowel sounds present, no guarding  Extremities:  pulses DP and PT palpable bilaterally  Data Reviewed: Basic Metabolic Panel:  Recent Labs Lab 08/09/14 1651 08/10/14 0656 08/11/14 0820 08/12/14 0508 08/13/14 0500  NA 140 137 139 141 142  K 3.9 3.4* 4.1 4.2 3.9  CL 104 98 104 105 102  CO2 29 31 29 30  35*  GLUCOSE 132* 182* 178* 129* 150*  BUN 18 18 25* 38* 47*  CREATININE 0.93 0.82 1.06 0.98 1.27  CALCIUM 8.6 8.0* 8.4 8.3* 8.1*   Liver Function Tests:  Recent Labs Lab 08/09/14 1651 08/10/14 0656  AST 23 19  ALT 17 13  ALKPHOS 106 87  BILITOT 1.0 0.6  PROT 6.9 6.0  ALBUMIN 3.3* 2.7*   CBC:  Recent Labs Lab 08/09/14 1651 08/10/14 0656 08/13/14 0500  WBC 9.8 4.6 14.0*  NEUTROABS 8.3*  --   --   HGB 12.7* 11.8* 11.7*  HCT 40.8 36.9* 37.9*  MCV 88.3 86.6 88.1  PLT 250 236 235   Cardiac Enzymes:  Recent Labs Lab 08/09/14 1649 08/10/14 0013 08/10/14 0656 08/11/14 0820  TROPONINI <0.03 0.13* 0.23* 0.10*    Recent Results (from the past 240 hour(s))  Blood Culture (routine x 2)     Status: None (Preliminary result)   Collection Time: 08/09/14  4:37 PM  Result Value Ref Range Status   Specimen Description BLOOD RIGHT ANTECUBITAL  Final   Special Requests BOTTLES DRAWN AEROBIC AND ANAEROBIC 5CC EACH  Final   Culture   Final           BLOOD CULTURE RECEIVED NO GROWTH TO DATE CULTURE WILL BE HELD FOR 5 DAYS BEFORE ISSUING A FINAL NEGATIVE REPORT Performed at 08/13/14    Report Status PENDING  Incomplete  Blood Culture (routine x 2)     Status: None (Preliminary result)   Collection Time: 08/09/14  4:51 PM  Result Value Ref Range Status   Specimen Description BLOOD LEFT ANTECUBITAL  Final   Special Requests BOTTLES DRAWN AEROBIC AND ANAEROBIC 5CC EACH  Final   Culture   Final           BLOOD CULTURE RECEIVED NO GROWTH TO DATE CULTURE WILL BE HELD FOR 5 DAYS BEFORE ISSUING A FINAL NEGATIVE REPORT Performed at  [facility]    Report Status PENDING  Incomplete  MRSA PCR Screening     Status: None   Collection Time: 08/09/14  7:32 PM  Result Value Ref Range Status   MRSA by PCR NEGATIVE NEGATIVE Final    Comment:        The GeneXpert MRSA Assay (FDA approved for NASAL specimens only), is one component of a comprehensive MRSA colonization surveillance program. It is not intended to diagnose MRSA infection nor to guide or monitor treatment for MRSA infections.  Urine culture     Status: None   Collection Time: 08/09/14  7:33 PM  Result Value Ref Range Status   Specimen Description URINE, CLEAN CATCH  Final   Special Requests NONE  Final   Colony Count   Final    8,000 COLONIES/ML Performed at [facility]    Culture   Final    INSIGNIFICANT GROWTH Performed at [facility]    Report Status 08/11/2014 FINAL  Final     Scheduled Meds: . aspirin  325 mg Oral Daily  . budesonide  0.25 mg Nebulization BID  . divalproex  250 mg Oral BID  . donepezil  20 mg Oral QHS  . enoxaparin  injection  40 mg Subcutaneous Q24H  . furosemide  20 mg Oral Daily  . guaiFENesin  600 mg Oral BID  . ipratropium-albuterol  3 mL Nebulization TID  . ipratropium-albuterol  3 mL Nebulization STAT  . levofloxacin  IV  750 mg Intravenous Q24H  . loratadine  10 mg Oral Daily  . methylPREDNISolone  60 mg Intravenous 4 times per day  . pravastatin  40 mg Oral QHS   Continuous Infusions:

## 2014-08-13 NOTE — Progress Notes (Signed)
Speech Language Pathology Treatment: Dysphagia  Patient Details Name: [name] [name] MRN: [id number] DOB: April 29, 1932 Today's Date: 08/13/2014 Time: 2423-5361 SLP Time Calculation (min) (ACUTE ONLY): 14 min  Assessment / Plan / Recommendation Clinical Impression  Note events of last pm with pt increased WOB s/p lasix and treatment with improvement.  Pt today being fed breakfast by nurse technician upon SLP entrance to room.  Pt refused to feed himself stating "I'm lazy".  No s/s of aspiration noted with po observed however pt will be chronic risk due to his COPD.    SLP provided pt with written copies of diet recommendations and compensation strategies.  Pt stated "I don't know shit" when instructed to importance to stay upright after meals and rest if dyspneic.  Pt with limited capacity to understand swallow test results or/and behavior prohibiting participation.    Recommend continue diet with strict aspiration precautions.  Pt may benefit from dedicated esophageal evaluation if MD desires due to findings of MBS concerning for multifactorial dysphagia.    No family present at this time and therefore SLP will follow up to educate them to findings of test, recommendations to decrease aspiration risk.     HPI HPI: 81 year old male admitted 08/09/14 due to lethargy and decreased responsiveness. PMH significant for COPD, CAD, HTN, HLD, severe dementia. BSE completed with recommendations for MBS to rule out silent aspiration.     Pertinent Vitals Pain Assessment: No/denies pain  SLP Plan  Continue with current plan of care    Recommendations Diet recommendations: Dysphagia 3 (mechanical soft);Thin liquid Medication Administration: Whole meds with puree (start and follow with water or with water only if tolerated) Supervision: Staff to assist with self feeding;Full supervision/cueing for compensatory strategies Compensations: Slow rate;Small sips/bites Postural Changes and/or Swallow Maneuvers:  Seated upright 90 degrees;Upright 30-60 min after meal              Oral Care Recommendations: Oral care BID Follow up Recommendations: Skilled Nursing facility Plan: Continue with current plan of care    GO     Donavan Burnet, MS [facility] SLP [phone]

## 2014-08-14 ENCOUNTER — Inpatient Hospital Stay (HOSPITAL_COMMUNITY): Payer: Medicare Other

## 2014-08-14 LAB — BASIC METABOLIC PANEL
Anion gap: 8 (ref 5–15)
BUN: 42 mg/dL — AB (ref 6–23)
CO2: 37 mmol/L — ABNORMAL HIGH (ref 19–32)
CREATININE: 1.07 mg/dL (ref 0.50–1.35)
Calcium: 7.9 mg/dL — ABNORMAL LOW (ref 8.4–10.5)
Chloride: 96 mmol/L (ref 96–112)
GFR calc Af Amer: 74 mL/min — ABNORMAL LOW (ref >90)
GFR, EST NON AFRICAN AMERICAN: 64 mL/min — AB (ref >90)
GLUCOSE: 158 mg/dL — AB (ref 70–99)
Potassium: 3.5 mmol/L (ref 3.5–5.1)
Sodium: 141 mmol/L (ref 135–145)

## 2014-08-14 LAB — CBC
HCT: 38.1 % — ABNORMAL LOW (ref 39.0–52.0)
Hemoglobin: 12.1 g/dL — ABNORMAL LOW (ref 13.0–17.0)
MCH: 27.4 pg (ref 26.0–34.0)
MCHC: 31.8 g/dL (ref 30.0–36.0)
MCV: 86.4 fL (ref 78.0–100.0)
Platelets: 213 10*3/uL (ref 150–400)
RBC: 4.41 MIL/uL (ref 4.22–5.81)
RDW: 13.4 % (ref 11.5–15.5)
WBC: 13.2 10*3/uL — ABNORMAL HIGH (ref 4.0–10.5)

## 2014-08-14 MED ORDER — METHYLPREDNISOLONE SODIUM SUCC 125 MG IJ SOLR
60.0000 mg | Freq: Four times a day (QID) | INTRAMUSCULAR | Status: DC
  Administered 2014-08-14 – 2014-08-16 (×9): 60 mg via INTRAVENOUS
  Filled 2014-08-14 (×9): qty 2

## 2014-08-14 MED ORDER — IPRATROPIUM-ALBUTEROL 0.5-2.5 (3) MG/3ML IN SOLN
3.0000 mL | Freq: Four times a day (QID) | RESPIRATORY_TRACT | Status: DC
  Administered 2014-08-14 – 2014-08-17 (×11): 3 mL via RESPIRATORY_TRACT
  Filled 2014-08-14 (×12): qty 3

## 2014-08-14 MED ORDER — ALBUTEROL SULFATE (2.5 MG/3ML) 0.083% IN NEBU: 2.5000 mg | INHALATION_SOLUTION | RESPIRATORY_TRACT | Status: DC | PRN

## 2014-08-14 MED ORDER — GUAIFENESIN 100 MG/5ML PO SOLN
200.0000 mg | ORAL | Status: DC | PRN
  Filled 2014-08-14: qty 10

## 2014-08-14 MED ORDER — FUROSEMIDE 40 MG PO TABS
40.0000 mg | ORAL_TABLET | Freq: Every day | ORAL | Status: DC
  Administered 2014-08-14 – 2014-08-17 (×4): 40 mg via ORAL
  Filled 2014-08-14 (×4): qty 1

## 2014-08-14 MED ORDER — LORAZEPAM 2 MG/ML IJ SOLN
0.5000 mg | Freq: Once | INTRAMUSCULAR | Status: AC
  Administered 2014-08-14: 0.5 mg via INTRAVENOUS
  Filled 2014-08-14: qty 1

## 2014-08-14 NOTE — Progress Notes (Addendum)
Physical Therapy Treatment Patient Details Name: [name] [name] MRN: [id number] DOB: [birth year] Today's Date: 08/14/2014    History of Present Illness 81 yo male admitted with Pna. Hx of COPD, HTN, severe dementia, RA, back pain. Pt is from SNF-participating with therapies.     PT Comments    Increased assistance and cueing needed on today. Pt fatigues with minimal activity. Remained on [place] O2 during session. O2 sats 96% end of session. Difficulty maintaining static sitting balance at EOB. Assisted pt to recliner-stand pivot with RW from bed to chair. Made RN aware that pt was in recliner and that condom cath came off during transition. Left urinal in place until catheter could be replaced. Recommend SNF.   Follow Up Recommendations  SNF;Supervision/Assistance - 24 hour     Equipment Recommendations  Rolling walker with 5" wheels    Recommendations for Other Services       Precautions / Restrictions Precautions Precautions: Fall Precaution Comments: monitor sats Restrictions Weight Bearing Restrictions: No    Mobility  Bed Mobility Overal bed mobility: Needs Assistance Bed Mobility: Supine to Sit     Supine to sit: Mod assist;+2 for physical assistance;+2 for safety/equipment;HOB elevated     General bed mobility comments: Assist for LEs and trunk. Increased time. Utilized bedpad for scooting, positioning.   Transfers Overall transfer level: Needs assistance Equipment used: Rolling walker (2 wheeled) Transfers: Sit to/from [employer] Sit to Stand: Max assist Stand pivot transfers: Max assist       General transfer comment: Assist to rise, weight shift,stabilize, control descent. Multimodal cues for safety, technique, hand placement Stand pivot from bed to [facility] with RW-pt requiring increased assistance. Very unsteady. Max directional cueing  and encouragement  Ambulation/Gait                 Stairs            Wheelchair Mobility     Modified Rankin (Stroke Patients Only)       Balance Overall balance assessment: Needs assistance         Standing balance support: Bilateral upper extremity supported;During functional activity Standing balance-Leahy Scale: Poor Standing balance comment: very unsteady                    Cognition Arousal/Alertness: Awake/alert Behavior During Therapy: WFL for tasks assessed/performed Overall Cognitive Status: Impaired/Different from baseline Area of Impairment: Memory;Safety/judgement     Memory: Decreased short-term memory;Decreased recall of precautions   Safety/Judgement: Decreased awareness of safety          Exercises      General Comments        Pertinent Vitals/Pain Pain Assessment: No/denies pain    Home Living                      Prior Function            PT Goals (current goals can now be found in the care plan section) Progress towards PT goals: Progressing toward goals    Frequency  Min 3X/week    PT Plan Current plan remains appropriate    Co-evaluation             End of Session Equipment Utilized During Treatment: Gait belt;Oxygen Activity Tolerance: Patient limited by fatigue Patient left: in chair;with call bell/phone within reach; with chair alarm set (made RN aware that pt was in chair and that condom cath needed to be replaced)  Time: 1607-3710 PT Time Calculation (min) (ACUTE ONLY): 14 min  Charges:  $Therapeutic Activity: 8-22 mins                    G Codes:      Rebeca Alert, MPT Pager: [phone]

## 2014-08-14 NOTE — Progress Notes (Signed)
Pt repeatedly screams for help, but when staff arrives to room he is in no distress complaining he is "prisoner" here and everyone is "sadistic." Pt complains of being SOB, but frequently pulls O2 out of nose. He feels better when it is in, but does not see the correlation between the two. Pt is confused and verbally abusive to staff. Will continue to monitor.

## 2014-08-14 NOTE — Progress Notes (Addendum)
Patient ID: [name] [name], male   DOB: [birth year], 81 y.o.   MRN: [id number]  [facility] PROGRESS NOTE  Nycere Presley Lepore EHU:[id number] DOB: 14-Jan-1932 DOA: 08/09/2014 PCP: Hoyle Sauer, MD   Brief narrative:    81 y.o. male has a past medical history significant for COPD, coronary artery disease, hypertension, hyperlipidemia, severe dementia, is being brought from the nursing home with lethargy and decreased responsiveness. Found to have hypoxic respiratory failure.   Assessment/Plan:    Principal Problem:  Acute encephalopathy - secondary to HCAP and acute COPD - Patient reports feeling better this morning but feels tired  Acute hypoxic respiratory failure secondary to HCAP (healthcare-associated pneumonia), multilobar  - Significant wheezing on exam with inspiration and expiration this morning - We will readjust Solu-Medrol to 60 mg IV every 6 hours and reassess clinical status in the morning - Ask for chest x-ray today - Continue BD scheduled and as needed - Also continue IV Levaquin due to high risk of aspiration, may be able to change to by mouth after chest x-rays back Active Problems:  Acute on COPD exacerbation - still with wheezing on exam this morning, unable to wean off steroids at this time - continue BD's as noted above - Still requires oxygen via nasal cannula, unable to wean off  Dementia - stable   Hypertension - reasonable inpatient control   Severe PCM - in the context of acute on chronic illness - improved oral intake - soft diet recommended with aspiration precautions   Leukocytosis - likely form PNA and steroid induced  - WBC slowly trending down - CBC in AM  Acute on chronic diastolic heart failure  - no prior 2-D echo in the system - 2D echo 2/28 with EF 55% and grade 1 diastolic dysfunction - responded well to IV diuresis initially, started on po Lasix 2/28 however weight up 3/1, net positive based on I&O - BMP with good  renal function, slight BUN elevation due to diuresis  - RBBB which was not present in 2014, troponin flat, likely demand.  - He is not a candidate for invasive testing. - Weight is up from 176 pounds, 179 pounds this morning - Will increase the dose of Lasix to 40 mg by mouth daily  Code Status: DNR Family Communication: plan of care discussed with the patient Disposition Plan: SNF when medically stable, needs to be off of IV steroids for 24 hours  IV access:  Peripheral IV  Procedures and diagnostic studies:    Dg Chest 2 View 07/29/2014 Bibasilar infiltrates consistent with pneumonia. 2. Bibasilar atelectasis and scarring.   Dg Chest 2 View 07/15/2014 Partial but incomplete clearing of right middle lobe infiltrate. No new opacity. Underlying emphysema. No change in cardiac silhouette.   Dg Chest Port 1 View 08/12/2014 Diffuse interstitial prominence compatible with interstitial pulmonary edema. Scarring at the RIGHT base with probable small residual RIGHT pleural effusion.   Dg Chest Port 1 View 08/09/2014 Small bilateral pleural effusions. Central interstitial pattern suggest edema or interstitial pneumonia.   Dg Swallowing Func-speech Pathology 08/12/2014 Aspiration noted with thin liquid, nectar thick liquids, honey, and Puree    Medical Consultants:  None  Other Consultants:  SLP PT  IAnti-Infectives:   Vancomycin 2/27 >> 3/1 Aztreonam 2/27 >> 3/1 Levofloxacin 3/1 >>  Debbora Presto, MD  [facility] Pager [phone]  If 7PM-7AM, please contact night-coverage [email or web address] Password [facility] 08/14/2014, 11:42 AM   LOS: 5 days   HPI/Subjective: No events overnight.   Objective: Filed  Vitals:   08/13/14 1506 08/13/14 2237 08/14/14 0500 08/14/14 0744  BP: 139/77 134/77    Pulse: 83 94    Temp: 98.4 F (36.9 C) 97.6 F (36.4 C)    TempSrc: Oral Oral    Resp: 20 20    Height:      Weight:   81.194 kg (179 lb)   SpO2: 93% 93%  96%     Intake/Output Summary (Last 24 hours) at 08/14/14 1142 Last data filed at 08/14/14 0344  Gross per 24 hour  Intake    390 ml  Output   1400 ml  Net  -1010 ml    Exam:   General:  Pt is sleeping, easy to arouse  Cardiovascular: Regular rate and rhythm, S1/S2, no murmurs, no rubs, no gallops  Respiratory: Coarse breath sounds bilaterally with expiratory and inspiratory wheezing, diminished breath sounds at base  Abdomen: Soft, non tender, non distended, bowel sounds present, no guarding  Extremities: No edema, pulses DP and PT palpable bilaterally  Neuro: Grossly nonfocal  Data Reviewed: Basic Metabolic Panel:  Recent Labs Lab 08/10/14 0656 08/11/14 0820 08/12/14 0508 08/13/14 0500 08/14/14 0600  NA 137 139 141 142 141  K 3.4* 4.1 4.2 3.9 3.5  CL 98 104 105 102 96  CO2 31 29 30  35* 37*  GLUCOSE 182* 178* 129* 150* 158*  BUN 18 25* 38* 47* 42*  CREATININE 0.82 1.06 0.98 1.27 1.07  CALCIUM 8.0* 8.4 8.3* 8.1* 7.9*   Liver Function Tests:  Recent Labs Lab 08/09/14 1651 08/10/14 0656  AST 23 19  ALT 17 13  ALKPHOS 106 87  BILITOT 1.0 0.6  PROT 6.9 6.0  ALBUMIN 3.3* 2.7*   CBC:  Recent Labs Lab 08/09/14 1651 08/10/14 0656 08/13/14 0500 08/14/14 0600  WBC 9.8 4.6 14.0* 13.2*  NEUTROABS 8.3*  --   --   --   HGB 12.7* 11.8* 11.7* 12.1*  HCT 40.8 36.9* 37.9* 38.1*  MCV 88.3 86.6 88.1 86.4  PLT 250 236 235 213   Cardiac Enzymes:  Recent Labs Lab 08/09/14 1649 08/10/14 0013 08/10/14 0656 08/11/14 0820  TROPONINI <0.03 0.13* 0.23* 0.10*     Recent Results (from the past 240 hour(s))  Blood Culture (routine x 2)     Status: None (Preliminary result)   Collection Time: 08/09/14  4:37 PM  Result Value Ref Range Status   Specimen Description BLOOD RIGHT ANTECUBITAL  Final   Special Requests BOTTLES DRAWN AEROBIC AND ANAEROBIC 5CC EACH  Final   Culture   Final           BLOOD CULTURE RECEIVED NO GROWTH TO DATE CULTURE WILL BE HELD FOR 5 DAYS  BEFORE ISSUING A FINAL NEGATIVE REPORT Performed at 08/11/14    Report Status PENDING  Incomplete  Blood Culture (routine x 2)     Status: None (Preliminary result)   Collection Time: 08/09/14  4:51 PM  Result Value Ref Range Status   Specimen Description BLOOD LEFT ANTECUBITAL  Final   Special Requests BOTTLES DRAWN AEROBIC AND ANAEROBIC 5CC EACH  Final   Culture   Final           BLOOD CULTURE RECEIVED NO GROWTH TO DATE CULTURE WILL BE HELD FOR 5 DAYS BEFORE ISSUING A FINAL NEGATIVE REPORT Performed at 08/11/14    Report Status PENDING  Incomplete  MRSA PCR Screening     Status: None   Collection Time: 08/09/14  7:32 PM  Result  Value Ref Range Status   MRSA by PCR NEGATIVE NEGATIVE Final    Comment:        The GeneXpert MRSA Assay (FDA approved for NASAL specimens only), is one component of a comprehensive MRSA colonization surveillance program. It is not intended to diagnose MRSA infection nor to guide or monitor treatment for MRSA infections.   Urine culture     Status: None   Collection Time: 08/09/14  7:33 PM  Result Value Ref Range Status   Specimen Description URINE, CLEAN CATCH  Final   Special Requests NONE  Final   Colony Count   Final    8,000 COLONIES/ML Performed at [facility]    Culture   Final    INSIGNIFICANT GROWTH Performed at [facility]    Report Status 08/11/2014 FINAL  Final     Scheduled Meds: . antiseptic oral rinse  7 mL Mouth Rinse q12n4p  . aspirin  325 mg Oral Daily  . budesonide  0.25 mg Nebulization BID  . chlorhexidine  15 mL Mouth Rinse BID  . divalproex  250 mg Oral BID  . donepezil  20 mg Oral QHS  . enoxaparin (LOVENOX) injection  40 mg Subcutaneous Q24H  . furosemide  20 mg Oral Daily  . guaiFENesin  600 mg Oral BID  . ipratropium-albuterol  3 mL Nebulization QID  . levofloxacin (LEVAQUIN) IV  750 mg Intravenous Q24H  . loratadine  10 mg Oral Daily  . methylPREDNISolone  (SOLU-MEDROL) injection  60 mg Intravenous 4 times per day  . pravastatin  40 mg Oral QHS  . sodium chloride  3 mL Intravenous Q12H   Continuous Infusions:

## 2014-08-14 NOTE — Progress Notes (Signed)
Clinical Social Work  CSW met with patient at bedside. Introduced self and explained role. Pt's wife, Constance Holster is at bedside. CSW inquired about DC plans being that wife was debating between patient returning home or returning to [facility]. Wife states that she feels it is best that patient returns to [facility] because pt cannot get up and down the stairwell at home. CSW informed patient's wife that she needs to contact [facility] admissions coordinator, Narda Rutherford who will facilitate readmission paperwork. Wife agreeable and states she will call after patient eats and set up a time to do paperwork for tomorrow. CSW will continue to follow and set up further DC plans.  Glorious Peach BSW Intern

## 2014-08-15 LAB — BASIC METABOLIC PANEL
Anion gap: 9 (ref 5–15)
BUN: 44 mg/dL — ABNORMAL HIGH (ref 6–23)
CALCIUM: 8.2 mg/dL — AB (ref 8.4–10.5)
CO2: 40 mmol/L (ref 19–32)
CREATININE: 1.19 mg/dL (ref 0.50–1.35)
Chloride: 93 mmol/L — ABNORMAL LOW (ref 96–112)
GFR, EST AFRICAN AMERICAN: 64 mL/min — AB (ref >90)
GFR, EST NON AFRICAN AMERICAN: 55 mL/min — AB (ref >90)
Glucose, Bld: 176 mg/dL — ABNORMAL HIGH (ref 70–99)
POTASSIUM: 3.6 mmol/L (ref 3.5–5.1)
SODIUM: 142 mmol/L (ref 135–145)

## 2014-08-15 LAB — CULTURE, BLOOD (ROUTINE X 2)
CULTURE: NO GROWTH
Culture: NO GROWTH

## 2014-08-15 LAB — CBC
HCT: 41 % (ref 39.0–52.0)
Hemoglobin: 12.9 g/dL — ABNORMAL LOW (ref 13.0–17.0)
MCH: 27.6 pg (ref 26.0–34.0)
MCHC: 31.5 g/dL (ref 30.0–36.0)
MCV: 87.8 fL (ref 78.0–100.0)
Platelets: 207 10*3/uL (ref 150–400)
RBC: 4.67 MIL/uL (ref 4.22–5.81)
RDW: 13.6 % (ref 11.5–15.5)
WBC: 11.9 10*3/uL — ABNORMAL HIGH (ref 4.0–10.5)

## 2014-08-15 NOTE — Progress Notes (Signed)
Clinical Social Work  Patient was discussed during progression meeting and MD reports patient is not medically stable to DC to SNF today but possibly over the weekend. CSW spoke with [facility] who reports that wife has completed paperwork and can admit over the weekend if stable. Weekend CSW to contact SNF at [phone] if medically stable.  CSW will continue to follow.  Unk Lightning, LCSW (Coverage for [employer])

## 2014-08-15 NOTE — Progress Notes (Addendum)
ANTIBIOTIC CONSULT NOTE - Follow up  Pharmacy Consult for Levofloxacin Indication: HCAP  Allergies  Allergen Reactions  . Iohexol Shortness Of Breath and Other (See Comments)     Desc: Increased difficulty breathing, Onset Date: [id number]   . Zolpidem Tartrate Other (See Comments)    REACTION: Acute delirium  . Penicillins Other (See Comments)    unknown    Patient Measurements: Height: 5\' 7"  (170.2 cm) Weight: 176 lb 5.9 oz (80 kg) IBW/kg (Calculated) : 66.1  Assessment: 82 YOM presents 2/27 from NH with shortness of breath. PMH includes COPD, CAD, severe dementia. CXR reveals BL edema vs pneumonia. Code sepsis initiated w/ Vancomycin and aztreonam ordered per pharmacy. PCN allergy listed with unknown reaction. Abx now transitioned to [facility] for HCAP, with pharmacy to assist with dosing.  Antiinfectives  2/27 >> vancomycin >> 3/1 2/27 >> aztreonam >>3/1 3/1 >> levofloxacin >>  Labs / vitals Tmax: remains afebrile WBCs: elev but improving  Renal: SCr up slightly, 1.19, CrCl 49CG   Microbiology 2/27 blood x2: NGF 2/27 urine: 8K insignificant growth 2/27 MRSA PCR: negative 2/27 Legionella Ur Ag: negative 2/27 S pneumo Ur Ag: negative 2/27 flu PCR: negative  Goal of Therapy:  Appropriate antibiotic dosing for renal function and indication Eradication of infection  Plan:   Cont Levofloxacin 750mg  IV q24h.  Continue to monitor renal function, cultures, clinical course.  3/27, PharmD, pager [phone]. 08/15/2014,1:04 PM.

## 2014-08-15 NOTE — Progress Notes (Signed)
Patient ID: [name] [name], male   DOB: March 29, 1932, 81 y.o.   MRN: [id number]  [facility] PROGRESS NOTE  [name] [name] WOE:[id number] DOB: May 30, 1932 DOA: 08/09/2014 PCP: Hoyle Sauer, MD  Brief narrative:    81 y.o. male has a past medical history significant for COPD, coronary artery disease, hypertension, hyperlipidemia, severe dementia, is being brought from the nursing home with lethargy and decreased responsiveness. Found to have hypoxic respiratory failure.   Major events since admission: 3/3- worsening respiratory status, CXR with no new changes but unable to wean off steroids and had to increase the dose and frequency to 60 mg IV Q4 3/4 - better respiratory status but remains more confused, wife requested palliative care consultation   Assessment/Plan:    Principal Problem:  Acute encephalopathy - secondary to HCAP and acute COPD, underlying dementia - pt is more alert this am, knows [birth year] his birthday but confused at times - per wife still not at baseline   Acute hypoxic respiratory failure secondary to HCAP (healthcare-associated pneumonia), multilobar  - Significant wheezing on exam with inspiration and expiration 3/3 - increased the Solu-Medrol to 60 mg IV every 6 hours 3/3 and respiratory status is stable this AM 3/4 but still some whheezing - Continue bronchodilators scheduled and as needed - Also continue IV Levaquin due to high risk of aspiration, may be able to change to by mouth if pt remains alert in am  Active Problems:  Acute on COPD exacerbation - still with wheezing on exam this morning, unable to wean off steroids at this time - continue BD's as noted above - Still requires oxygen via nasal cannula  Dementia - stable but per wife rather advanced at baseline   Hypertension - reasonable inpatient control   Severe PCM - in the context of acute on chronic illness - improved oral intake - soft diet recommended with aspiration precautions    Leukocytosis - likely form PNA and steroid induced  - WBC slowly trending down - CBC in AM  Acute on chronic diastolic heart failure  - no prior 2-D echo in the system - 2D echo 2/28 with EF 55% and grade 1 diastolic dysfunction - responded well to IV diuresis initially, started on po Lasix 2/28 however weight up 3/1, net positive based on I&O - BMP with good renal function, slight BUN elevation due to diuresis  - RBBB which was not present in 2014, troponin flat, likely demand - He is not a candidate for invasive testing. - Weight is up from 176 pounds, 179 pounds 3/3 --> 176 lbs this AM - continue lasix 40 mg PO QD   Functional quadriplegia - from acute illness - most;y bed bound - OOB to chair   Code Status: DNR Family Communication: plan of care discussed with the patient and wife at bedside who is agreeable to PCT  Disposition Plan: SNF when medically stable, needs to be off of IV steroids for 24 hours  IV access:  Peripheral IV  Procedures and diagnostic studies:    Dg Chest 2 View 07/29/2014 Bibasilar infiltrates consistent with pneumonia. 2. Bibasilar atelectasis and scarring.   Dg Chest 2 View 07/15/2014 Partial but incomplete clearing of right middle lobe infiltrate. No new opacity. Underlying emphysema. No change in cardiac silhouette.   Dg Chest Port 1 View 08/12/2014 Diffuse interstitial prominence compatible with interstitial pulmonary edema. Scarring at the RIGHT base with probable small residual RIGHT pleural effusion.   Dg Chest Port 1 View 08/09/2014 Small bilateral pleural  effusions. Central interstitial pattern suggest edema or interstitial pneumonia.   Dg Swallowing Func-speech Pathology 08/12/2014 Aspiration noted with thin liquid, nectar thick liquids, honey, and Puree   Dg Chest 2 View  08/14/2014  No acute cardiopulmonary disease. Interstitial thickening noted previously has improved. There is no evidence of pulmonary edema. 2.  Chronic changes of COPD and lung base scarring and/or subsegmental atelectasis.     Medical Consultants:  PCT  Other Consultants:  PT SLP  IAnti-Infectives:   Vancomycin 2/27 >> 3/1 Aztreonam 2/27 >> 3/1 Levofloxacin 3/1 >> 3/5  Debbora Presto, MD  [facility] Pager [phone]  If 7PM-7AM, please contact night-coverage [email or web address] Password [facility] 08/15/2014, 4:57 PM   LOS: 6 days   HPI/Subjective: No events overnight.   Objective: Filed Vitals:   08/15/14 0500 08/15/14 0533 08/15/14 1150 08/15/14 1357  BP:  143/71  120/70  Pulse:  69  72  Temp:  97.4 F (36.3 C)  97.4 F (36.3 C)  TempSrc:  Oral  Oral  Resp:  16  18  Height:      Weight: 80 kg (176 lb 5.9 oz)     SpO2:  95% 96% 96%    Intake/Output Summary (Last 24 hours) at 08/15/14 1657 Last data filed at 08/15/14 1523  Gross per 24 hour  Intake    390 ml  Output    200 ml  Net    190 ml    Exam:   General:  Pt is alert, follows some commands appropriately, not in acute distress  Cardiovascular: Regular rate and rhythm,  no rubs, no gallops  Respiratory: better air movement in the upper lobes but with persistent exp wheezing   Abdomen: Soft, non tender, non distended, bowel sounds present, no guarding  Extremities: pulses DP and PT palpable bilaterally  Neuro: Grossly nonfocal but confused at time   Data Reviewed: Basic Metabolic Panel:  Recent Labs Lab 08/11/14 0820 08/12/14 0508 08/13/14 0500 08/14/14 0600 08/15/14 0530  NA 139 141 142 141 142  K 4.1 4.2 3.9 3.5 3.6  CL 104 105 102 96 93*  CO2 29 30 35* 37* 40*  GLUCOSE 178* 129* 150* 158* 176*  BUN 25* 38* 47* 42* 44*  CREATININE 1.06 0.98 1.27 1.07 1.19  CALCIUM 8.4 8.3* 8.1* 7.9* 8.2*   Liver Function Tests:  Recent Labs Lab 08/09/14 1651 08/10/14 0656  AST 23 19  ALT 17 13  ALKPHOS 106 87  BILITOT 1.0 0.6  PROT 6.9 6.0  ALBUMIN 3.3* 2.7*   CBC:  Recent Labs Lab 08/09/14 1651 08/10/14 0656 08/13/14 0500  08/14/14 0600 08/15/14 0530  WBC 9.8 4.6 14.0* 13.2* 11.9*  NEUTROABS 8.3*  --   --   --   --   HGB 12.7* 11.8* 11.7* 12.1* 12.9*  HCT 40.8 36.9* 37.9* 38.1* 41.0  MCV 88.3 86.6 88.1 86.4 87.8  PLT 250 236 235 213 207   Cardiac Enzymes:  Recent Labs Lab 08/09/14 1649 08/10/14 0013 08/10/14 0656 08/11/14 0820  TROPONINI <0.03 0.13* 0.23* 0.10*   Recent Results (from the past 240 hour(s))  Blood Culture (routine x 2)     Status: None   Collection Time: 08/09/14  4:37 PM  Result Value Ref Range Status   Specimen Description BLOOD RIGHT ANTECUBITAL  Final   Special Requests BOTTLES DRAWN AEROBIC AND ANAEROBIC 5CC EACH  Final   Culture   Final    NO GROWTH 5 DAYS Performed at [facility]    Report  Status 08/15/2014 FINAL  Final  Blood Culture (routine x 2)     Status: None   Collection Time: 08/09/14  4:51 PM  Result Value Ref Range Status   Specimen Description BLOOD LEFT ANTECUBITAL  Final   Special Requests BOTTLES DRAWN AEROBIC AND ANAEROBIC 5CC EACH  Final   Culture   Final    NO GROWTH 5 DAYS Performed at [facility]    Report Status 08/15/2014 FINAL  Final  MRSA PCR Screening     Status: None   Collection Time: 08/09/14  7:32 PM  Result Value Ref Range Status   MRSA by PCR NEGATIVE NEGATIVE Final  Urine culture     Status: None   Collection Time: 08/09/14  7:33 PM  Result Value Ref Range Status   Specimen Description URINE, CLEAN CATCH  Final   Special Requests NONE  Final   Colony Count   Final    8,000 COLONIES/ML Performed at [facility]    Culture   Final    INSIGNIFICANT GROWTH Performed at [facility]    Report Status 08/11/2014 FINAL  Final     Scheduled Meds: . aspirin  325 mg Oral Daily  . budesonide  0.25 mg Nebulization BID  . divalproex  250 mg Oral BID  . donepezil  20 mg Oral QHS  . enoxaparin injection  40 mg Subcutaneous Q24H  . furosemide  40 mg Oral Daily  . guaiFENesin  600 mg Oral BID  .  ipratropium-albuterol  3 mL Nebulization QID  . levofloxacin   750 mg Intravenous Q24H  . loratadine  10 mg Oral Daily  . methylPREDNISolone   60 mg Intravenous 4 times per day  . pravastatin  40 mg Oral QHS   Continuous Infusions:

## 2014-08-15 NOTE — Progress Notes (Signed)
To decrease anxiety, RN is using lavender aromatherapy to help him sleep. Pt is asleep now and respirations are even and unlabored. Will continue to monitor.

## 2014-08-15 NOTE — Progress Notes (Signed)
Physical Therapy Treatment Patient Details Name: [name] [name] MRN: [id number] DOB: Apr 06, 1932 Today's Date: 08/15/2014  HAPPY BIRTHDAY  !!!!!!  History of Present Illness 81 yo male admitted with Pna. Hx of COPD, HTN, severe dementia, RA, back pain. Pt is from SNF-participating with therapies.     PT Comments    Pt in bed on O@ groggy/sleepy but easily aroused.  Assisted fro supine to EOB.  Pt sat EOB x 12 min working on static and dynamic activity plus engaging core.  Noted decreased strength L UE/LE.  Self right was poor.  LOB forward and R.  Assisted from bed to recliner vis stand pivot "bear hug" as pt was too fatigued to attempt sit to stand.  Increased time to position to comfort.  Spouse and daughter present.   Follow Up Recommendations  SNF     Equipment Recommendations       Recommendations for Other Services       Precautions / Restrictions Precautions Precautions: Fall Precaution Comments: monitor sats Restrictions Weight Bearing Restrictions: No    Mobility  Bed Mobility Overal bed mobility: Needs Assistance Bed Mobility: Supine to Sit     Supine to sit: Mod assist;Max assist     General bed mobility comments: Assist for LEs and trunk. Increased time. Utilized bedpad for scooting, positioning.  Pt tolerated sitting EOB at Min Assist x 12 min before showing signs of fatigue.    Transfers Overall transfer level: Needs assistance       Stand pivot transfers: Max assist       General transfer comment: Stand pivot "bear hug" 1/4 turn from elevated bed to recliner.  Pt was too fatigue from sitting activity to attempt sit to dtand.    Ambulation/Gait             General Gait Details: too fatigued to attempt amb   Stairs            Wheelchair Mobility    Modified Rankin (Stroke Patients Only)       Balance                                    Cognition Arousal/Alertness: Awake/alert Behavior During Therapy: WFL for  tasks assessed/performed                        Exercises      General Comments        Pertinent Vitals/Pain Pain Assessment: No/denies pain    Home Living                      Prior Function            PT Goals (current goals can now be found in the care plan section) Progress towards PT goals: Progressing toward goals    Frequency  Min 3X/week    PT Plan Current plan remains appropriate    Co-evaluation             End of Session Equipment Utilized During Treatment: Gait belt;Oxygen Activity Tolerance: Patient limited by fatigue Patient left: in chair;with call bell/phone within reach;with family/visitor present     Time: 1443-1511 PT Time Calculation (min) (ACUTE ONLY): 28 min  Charges:  $Therapeutic Activity: 23-37 mins                    G Codes:  Rica Koyanagi  PTA [facility]  Acute  Rehab Pager      [phone]

## 2014-08-15 NOTE — Progress Notes (Signed)
CRITICAL VALUE ALERT  Critical value received: CO2-40  Date of notification:  08-15-2014  Time of notification:  0720  Critical value read back:Yes.    Nurse who received alert:  Ernst Breach RN  MD notified (1st page):  Dr. Izola Price  Time of first page:  0720  MD notified (2nd page):  Time of second page:  Responding MD:  Dr. Izola Price  Time MD responded:  [phone]

## 2014-08-16 DIAGNOSIS — R410 Disorientation, unspecified: Secondary | ICD-10-CM | POA: Insufficient documentation

## 2014-08-16 DIAGNOSIS — Z515 Encounter for palliative care: Secondary | ICD-10-CM | POA: Insufficient documentation

## 2014-08-16 LAB — CBC
HCT: 37.4 % — ABNORMAL LOW (ref 39.0–52.0)
Hemoglobin: 11.8 g/dL — ABNORMAL LOW (ref 13.0–17.0)
MCH: 27.3 pg (ref 26.0–34.0)
MCHC: 31.6 g/dL (ref 30.0–36.0)
MCV: 86.6 fL (ref 78.0–100.0)
PLATELETS: 177 10*3/uL (ref 150–400)
RBC: 4.32 MIL/uL (ref 4.22–5.81)
RDW: 13.3 % (ref 11.5–15.5)
WBC: 13.8 10*3/uL — ABNORMAL HIGH (ref 4.0–10.5)

## 2014-08-16 LAB — BASIC METABOLIC PANEL
ANION GAP: 8 (ref 5–15)
BUN: 46 mg/dL — AB (ref 6–23)
CHLORIDE: 94 mmol/L — AB (ref 96–112)
CO2: 37 mmol/L — AB (ref 19–32)
CREATININE: 1.09 mg/dL (ref 0.50–1.35)
Calcium: 7.9 mg/dL — ABNORMAL LOW (ref 8.4–10.5)
GFR, EST AFRICAN AMERICAN: 71 mL/min — AB (ref >90)
GFR, EST NON AFRICAN AMERICAN: 62 mL/min — AB (ref >90)
GLUCOSE: 155 mg/dL — AB (ref 70–99)
Potassium: 3.5 mmol/L (ref 3.5–5.1)
Sodium: 139 mmol/L (ref 135–145)

## 2014-08-16 MED ORDER — LEVOFLOXACIN 750 MG PO TABS
750.0000 mg | ORAL_TABLET | Freq: Every day | ORAL | Status: AC
  Administered 2014-08-16: 750 mg via ORAL
  Filled 2014-08-16: qty 1

## 2014-08-16 MED ORDER — METHYLPREDNISOLONE SODIUM SUCC 40 MG IJ SOLR
40.0000 mg | Freq: Four times a day (QID) | INTRAMUSCULAR | Status: DC
  Administered 2014-08-16 – 2014-08-17 (×4): 40 mg via INTRAVENOUS
  Filled 2014-08-16 (×4): qty 1

## 2014-08-16 MED ORDER — TRAZODONE HCL 50 MG PO TABS
50.0000 mg | ORAL_TABLET | Freq: Every evening | ORAL | Status: DC | PRN
  Administered 2014-08-16 – 2014-08-17 (×2): 50 mg via ORAL
  Filled 2014-08-16 (×2): qty 1

## 2014-08-16 NOTE — Progress Notes (Signed)
Patient c/o feeling SOB after logrolling back and forth a couple times for a bath/linen change.  VS obtained. BP 114/61, pulse 81 and oxygen saturation 99% on 5L/M [place]. SOB resolving.  Will continue to monitor.

## 2014-08-16 NOTE — Consult Note (Addendum)
Patient XB:[name] [name]      DOB: 18-Apr-1932      EQA:[id number]     Consult Note from the Palliative Medicine Team at [facility]    Consult Requested by: Dr Izola Price     PCP: Hoyle Sauer, MD Reason for Consultation: Goals of Care     Phone [phone]  Assessment/Recommendations: 81 yo male with COPD, CAD, diastolic HF, advanced dementia admitted with lethargy, resp failure.    1.  Code Status: DNR  2. GOC: Talked extensively with wife Arna Medici today. Ultimately, she wishes he were at home.  She feels that his agitation and memory issues are worsened at [facility] and while he initially made some rehab progress this has stopped. She does not feel like he is doing any better there at all recently.  We also talked about the natural trajectory of dementia and how worrisome his declining mobility is and also the recurrence in infections.  Arna Medici even states that she has had the sense that his life is coming to an end.  This is his 3rd admission for PNA since November and he seems to decline each time. What she would like to do is take him home, and I think this would be reasonable to do.  He has incontinence, declining mobility, recurrent infections which with his dementia puts him in a scenario where his life expectancy can be reasonably estimated to be 6 months or less if disease follows expected course.  She agrees that he also does poorly in hospital and would prefer to manage as much as she can at home.  She is aware that the next infection could also be his last.  I will place consult to care management for home hospice when he is medically stable for discharge. Would be reasonable to d/c statin at discharge as not likely to benefit from this any longer.   3. Symptom Management:   1. Acute Delirium: Will defer orders to primary team. I would recommend avoiding benzo's in him given his dementia/age and also history of this worsening agitation.  Recommend using PRN haldol instead. His  QTc is 480 and I don't suspect low dose haldol 1-2mg  PRN to be an issue with this. Keep blinds open during day.  Would restart trazodone qhs (has helped him in the past) avoid restraints as able.    4. Psychosocial/Spiritual:  Married to wife Arna Medici for 42 years and was previously at home with her until recent PNA admission and subsequent d/c to SNF rehab.    Brief HPI: 81 yo male with COPD, CAD, diastolic HF, advanced dementia who was admitted on 2/27 wit lethargy and acute hypoxic resp failure.  He presented to [facility] from [facility].  He was doing rehab there after recent hospitalization from 1/28 to 2/3 with PNA.  According to his wife Arna Medici he was doing poorly there. While he initially gained some strength back, things have not gone well recently. Mentation worsened there, more agitation, mobility not progressing. She feels he ultimately does better at home. Prior to episodes of PNA was at home with his wife and still able to ambulate with some assistance.  Can still recognize family.  Maintaining weight.  Here in hospital, she notes that his breathing is doing better but agitation still persists.  He has reacted poorly to benzo's in past.       PMH:  Past Medical History  Diagnosis Date  . Peripheral vascular disease   . Osteoarthritis   . Hypertension   .  Hyperlipidemia   . CAD (coronary artery disease)     Non stemi 3/11; PTCA first OM 3/11  . COPD (chronic obstructive pulmonary disease)   . History of colonic polyps   . Dementia   . Back pain   . Rheumatoid arthritis(714.0) 2013    [name]      PSH: Past Surgical History  Procedure Laterality Date  . Iliac artery stent      Stenting of left common iliac and external iliac  . Coronary angioplasty  08/2009    First OM   I have reviewed the FH and SH and  If appropriate update it with new information. Allergies  Allergen Reactions  . Iohexol Shortness Of Breath and Other (See Comments)     Desc: Increased  difficulty breathing, Onset Date: [id number]   . Zolpidem Tartrate Other (See Comments)    REACTION: Acute delirium  . Penicillins Other (See Comments)    unknown   Scheduled Meds: . antiseptic oral rinse  7 mL Mouth Rinse q12n4p  . aspirin  325 mg Oral Daily  . budesonide  0.25 mg Nebulization BID  . chlorhexidine  15 mL Mouth Rinse BID  . divalproex  250 mg Oral BID  . donepezil  20 mg Oral QHS  . enoxaparin (LOVENOX) injection  40 mg Subcutaneous Q24H  . furosemide  40 mg Oral Daily  . guaiFENesin  600 mg Oral BID  . ipratropium-albuterol  3 mL Nebulization QID  . levofloxacin  750 mg Oral Daily  . loratadine  10 mg Oral Daily  . methylPREDNISolone (SOLU-MEDROL) injection  40 mg Intravenous Q6H  . pravastatin  40 mg Oral QHS  . sodium chloride  3 mL Intravenous Q12H   Continuous Infusions:  PRN Meds:.albuterol, famotidine, guaiFENesin, LORazepam    BP 114/61 mmHg  Pulse 81  Temp(Src) 98 F (36.7 C) (Oral)  Resp 20  Ht 5\' 7"  (1.702 m)  Wt 81.058 kg (178 lb 11.2 oz)  BMI 27.98 kg/m2  SpO2 99%   PPS: 40   Intake/Output Summary (Last 24 hours) at 08/16/14 1552 Last data filed at 08/16/14 1356  Gross per 24 hour  Intake   1470 ml  Output   1050 ml  Net    420 ml    Physical Exam:  General: Alert, confused HEENT:  [place], sclera anicteric Chest:   CTAB CVS: regular rate Abdomen: soft, ND Ext: no edema Neuro: agitates easily. Follows basic commands  Labs: CBC    Component Value Date/Time   WBC 13.8* 08/16/2014 0401   RBC 4.32 08/16/2014 0401   HGB 11.8* 08/16/2014 0401   HCT 37.4* 08/16/2014 0401   PLT 177 08/16/2014 0401   MCV 86.6 08/16/2014 0401   MCH 27.3 08/16/2014 0401   MCHC 31.6 08/16/2014 0401   RDW 13.3 08/16/2014 0401   LYMPHSABS 0.8 08/09/2014 1651   MONOABS 0.3 08/09/2014 1651   EOSABS 0.4 08/09/2014 1651   BASOSABS 0.1 08/09/2014 1651    BMET    Component Value Date/Time   NA 139 08/16/2014 0401   K 3.5 08/16/2014 0401   CL 94*  08/16/2014 0401   CO2 37* 08/16/2014 0401   GLUCOSE 155* 08/16/2014 0401   BUN 46* 08/16/2014 0401   CREATININE 1.09 08/16/2014 0401   CALCIUM 7.9* 08/16/2014 0401   GFRNONAA 62* 08/16/2014 0401   GFRAA 71* 08/16/2014 0401    CMP     Component Value Date/Time   NA 139 08/16/2014 0401   K  3.5 08/16/2014 0401   CL 94* 08/16/2014 0401   CO2 37* 08/16/2014 0401   GLUCOSE 155* 08/16/2014 0401   BUN 46* 08/16/2014 0401   CREATININE 1.09 08/16/2014 0401   CALCIUM 7.9* 08/16/2014 0401   PROT 6.0 08/10/2014 0656   ALBUMIN 2.7* 08/10/2014 0656   AST 19 08/10/2014 0656   ALT 13 08/10/2014 0656   ALKPHOS 87 08/10/2014 0656   BILITOT 0.6 08/10/2014 0656   GFRNONAA 62* 08/16/2014 0401   GFRAA 71* 08/16/2014 0401    3/3 CXR IMPRESSION: 1. No acute cardiopulmonary disease. Interstitial thickening noted previously has improved. There is no evidence of pulmonary edema. 2. Chronic changes of COPD and lung base scarring and/or subsegmental atelectasis.   Total Time: 60 minutes Greater than 50%  of this time was spent counseling and coordinating care related to the above assessment and plan.  Orvis Brill D.O. Palliative Medicine Team at [facility]  Pager: [phone] Team Phone: [phone]

## 2014-08-16 NOTE — Progress Notes (Signed)
PHARMACIST - PHYSICIAN COMMUNICATION DR:   Izola Price CONCERNING: Antibiotic IV to Oral Route Change Policy  RECOMMENDATION: This patient is receiving Levaquin by the intravenous route.  Based on criteria approved by the Pharmacy and Therapeutics Committee, the antibiotic(s) is/are being converted to the equivalent oral dose form(s).   DESCRIPTION: These criteria include:  Patient being treated for a respiratory tract infection, urinary tract infection, cellulitis or clostridium difficile associated diarrhea if on metronidazole  The patient is not neutropenic and does not exhibit a GI malabsorption state  The patient is eating (either orally or via tube) and/or has been taking other orally administered medications for a least 24 hours  The patient is improving clinically and has a Tmax < 100.5  If you have questions about this conversion, please contact the Pharmacy Department  []   [phone] )  ( [phone] []   [phone] )   []   [phone] )  [facility] [x]   [phone] )  [facility]    [facility], PharmD, BCPS Pager [phone] 08/16/2014 11:01 AM

## 2014-08-16 NOTE — Progress Notes (Signed)
Patient ID: [name] [name], male   DOB: March 16, 1932, 81 y.o.   MRN: [id number]  [facility] PROGRESS NOTE  [name] [name] ATF:[id number] DOB: 03/04/1932 DOA: 08/09/2014 PCP: Hoyle Sauer, MD  Brief narrative:    81 y.o. male has a past medical history significant for COPD, coronary artery disease, hypertension, hyperlipidemia, severe dementia, is being brought from the nursing home with lethargy and decreased responsiveness. Found to have hypoxic respiratory failure.   Major events since admission: 3/3- worsening respiratory status, CXR with no new changes but unable to wean off steroids and had to increase the dose and frequency to 60 mg IV Q4 3/4 - better respiratory status but remains more confused, wife requested palliative care consultation  3/5 - less wheezing on exam but remains confused   Assessment/Plan:    Principal Problem:  Acute encephalopathy - secondary to HCAP and acute COPD, underlying dementia - pt is more alert this am, still confused at time during the exam  - per wife still not at baseline   Acute hypoxic respiratory failure secondary to HCAP (healthcare-associated pneumonia), multilobar  - Significant wheezing on exam with inspiration and expiration 3/3 - 3/4 but much better AM 3/5  - increased the Solu-Medrol to 60 mg IV every 6 hours 3/3 and respiratory status is stable this AM, still mild exp wheezing  - Continue bronchodilators scheduled and as needed - continue solumedrol for now and plan on tapering down today to 40 mg IV Q6 hours  - today is the last day of Levaquin  Active Problems:  Acute on COPD exacerbation - still with wheezing on exam this morning, very slow wean off of steroids  - continue BD's as noted above - Still requires oxygen via nasal cannula  Dementia - stable but per wife rather advanced dementia at baseline   Hypertension - reasonable inpatient control   Severe PCM - in the context of acute on chronic illness -  improved oral intake - soft diet recommended with aspiration precautions   Leukocytosis - likely form PNA and steroid induced  - CBC in AM  Acute on chronic diastolic heart failure  - no prior 2-D echo in the system - 2D echo 2/28 with EF 55% and grade 1 diastolic dysfunction - responded well to IV diuresis initially, started on po Lasix 2/28 however weight up 3/1, net positive based on I&O - BMP with good renal function, slight BUN elevation due to diuresis  - RBBB which was not present in 2014, troponins flat, likely demand - He is not a candidate for invasive testing. - Weight is up from 176 pounds, 179 pounds 3/3 --> 176 --> 178 lbs this AM - continue lasix 40 mg PO QD   Functional quadriplegia - from acute illness - most;y bed bound - OOB to chair   Code Status: DNR Family Communication: plan of care discussed with the patient and wife at bedside who is agreeable to PCT  Disposition Plan: SNF when medically stable, needs to be off of IV steroids for 24 hours  IV access:  Peripheral IV  Procedures and diagnostic studies:    Dg Chest 2 View 07/29/2014 Bibasilar infiltrates consistent with pneumonia. 2. Bibasilar atelectasis and scarring.   Dg Chest 2 View 07/15/2014 Partial but incomplete clearing of right middle lobe infiltrate. No new opacity. Underlying emphysema. No change in cardiac silhouette.   Dg Chest Port 1 View 08/12/2014 Diffuse interstitial prominence compatible with interstitial pulmonary edema. Scarring at the RIGHT base with probable  small residual RIGHT pleural effusion.   Dg Chest Port 1 View 08/09/2014 Small bilateral pleural effusions. Central interstitial pattern suggest edema or interstitial pneumonia.   Dg Swallowing Func-speech Pathology 08/12/2014 Aspiration noted with thin liquid, nectar thick liquids, honey, and Puree   Dg Chest 2 View  08/14/2014  No acute cardiopulmonary disease. Interstitial thickening noted previously has  improved. There is no evidence of pulmonary edema. 2. Chronic changes of COPD and lung base scarring and/or subsegmental atelectasis.     Medical Consultants:  PCT  Other Consultants:  PT SLP  IAnti-Infectives:   Vancomycin 2/27 >> 3/1 Aztreonam 2/27 >> 3/1 Levofloxacin 3/1 >> 3/5  Debbora Presto, MD  [facility] Pager [phone]  If 7PM-7AM, please contact night-coverage [email or web address] Password [email or web address] 08/16/2014, 2:30 PM   LOS: 7 days   HPI/Subjective: No events overnight.   Objective: Filed Vitals:   08/15/14 2011 08/16/14 0548 08/16/14 0855 08/16/14 1349  BP: 142/73 123/60  129/60  Pulse: 87 60  85  Temp: 97.4 F (36.3 C) 97.5 F (36.4 C)  98 F (36.7 C)  TempSrc: Oral Oral  Oral  Resp: 18 16  20   Height:      Weight:  81.058 kg (178 lb 11.2 oz)    SpO2: 99% 98% 96% 97%    Intake/Output Summary (Last 24 hours) at 08/16/14 1430 Last data filed at 08/16/14 1356  Gross per 24 hour  Intake   1590 ml  Output   1050 ml  Net    540 ml    Exam:   General:  Pt is alert, follows some commands appropriately, still confused at times   Cardiovascular: Regular rate and rhythm,  no rubs, no gallops  Respiratory: better air movement in the upper lobes but with persistent exp wheezing even though better compared to yesterday   Abdomen: Soft, non tender, non distended, bowel sounds present, no guarding  Extremities: pulses DP and PT palpable bilaterally  Neuro: Grossly nonfocal but confused at time   Data Reviewed: Basic Metabolic Panel:  Recent Labs Lab 08/12/14 0508 08/13/14 0500 08/14/14 0600 08/15/14 0530 08/16/14 0401  NA 141 142 141 142 139  K 4.2 3.9 3.5 3.6 3.5  CL 105 102 96 93* 94*  CO2 30 35* 37* 40* 37*  GLUCOSE 129* 150* 158* 176* 155*  BUN 38* 47* 42* 44* 46*  CREATININE 0.98 1.27 1.07 1.19 1.09  CALCIUM 8.3* 8.1* 7.9* 8.2* 7.9*   Liver Function Tests:  Recent Labs Lab 08/09/14 1651 08/10/14 0656  AST 23 19  ALT 17 13  ALKPHOS 106 87   BILITOT 1.0 0.6  PROT 6.9 6.0  ALBUMIN 3.3* 2.7*   CBC:  Recent Labs Lab 08/09/14 1651 08/10/14 0656 08/13/14 0500 08/14/14 0600 08/15/14 0530 08/16/14 0401  WBC 9.8 4.6 14.0* 13.2* 11.9* 13.8*  NEUTROABS 8.3*  --   --   --   --   --   HGB 12.7* 11.8* 11.7* 12.1* 12.9* 11.8*  HCT 40.8 36.9* 37.9* 38.1* 41.0 37.4*  MCV 88.3 86.6 88.1 86.4 87.8 86.6  PLT 250 236 235 213 207 177   Cardiac Enzymes:  Recent Labs Lab 08/09/14 1649 08/10/14 0013 08/10/14 0656 08/11/14 0820  TROPONINI <0.03 0.13* 0.23* 0.10*   Recent Results (from the past 240 hour(s))  Blood Culture (routine x 2)     Status: None   Collection Time: 08/09/14  4:37 PM  Result Value Ref Range Status   Specimen Description BLOOD RIGHT ANTECUBITAL  Final  Special Requests BOTTLES DRAWN AEROBIC AND ANAEROBIC 5CC EACH  Final   Culture   Final    NO GROWTH 5 DAYS Performed at [facility]    Report Status 08/15/2014 FINAL  Final  Blood Culture (routine x 2)     Status: None   Collection Time: 08/09/14  4:51 PM  Result Value Ref Range Status   Specimen Description BLOOD LEFT ANTECUBITAL  Final   Special Requests BOTTLES DRAWN AEROBIC AND ANAEROBIC 5CC EACH  Final   Culture   Final    NO GROWTH 5 DAYS Performed at [facility]    Report Status 08/15/2014 FINAL  Final  MRSA PCR Screening     Status: None   Collection Time: 08/09/14  7:32 PM  Result Value Ref Range Status   MRSA by PCR NEGATIVE NEGATIVE Final  Urine culture     Status: None   Collection Time: 08/09/14  7:33 PM  Result Value Ref Range Status   Specimen Description URINE, CLEAN CATCH  Final   Special Requests NONE  Final   Colony Count   Final    8,000 COLONIES/ML Performed at [facility]    Culture   Final    INSIGNIFICANT GROWTH Performed at [facility]    Report Status 08/11/2014 FINAL  Final     Scheduled Meds: . aspirin  325 mg Oral Daily  . budesonide  0.25 mg Nebulization BID  .  divalproex  250 mg Oral BID  . donepezil  20 mg Oral QHS  . enoxaparin injection  40 mg Subcutaneous Q24H  . furosemide  40 mg Oral Daily  . guaiFENesin  600 mg Oral BID  . ipratropium-albuterol  3 mL Nebulization QID  . levofloxacin   750 mg Intravenous Q24H  . loratadine  10 mg Oral Daily  . methylPREDNISolone   60 mg Intravenous 4 times per day  . pravastatin  40 mg Oral QHS   Continuous Infusions:

## 2014-08-17 LAB — CBC
HEMATOCRIT: 36.2 % — AB (ref 39.0–52.0)
Hemoglobin: 11.9 g/dL — ABNORMAL LOW (ref 13.0–17.0)
MCH: 28.6 pg (ref 26.0–34.0)
MCHC: 32.9 g/dL (ref 30.0–36.0)
MCV: 87 fL (ref 78.0–100.0)
Platelets: 170 10*3/uL (ref 150–400)
RBC: 4.16 MIL/uL — AB (ref 4.22–5.81)
RDW: 13.5 % (ref 11.5–15.5)
WBC: 13.9 10*3/uL — AB (ref 4.0–10.5)

## 2014-08-17 LAB — BASIC METABOLIC PANEL
Anion gap: 7 (ref 5–15)
BUN: 48 mg/dL — AB (ref 6–23)
CHLORIDE: 95 mmol/L — AB (ref 96–112)
CO2: 39 mmol/L — AB (ref 19–32)
CREATININE: 1.29 mg/dL (ref 0.50–1.35)
Calcium: 7.9 mg/dL — ABNORMAL LOW (ref 8.4–10.5)
GFR calc non Af Amer: 50 mL/min — ABNORMAL LOW (ref >90)
GFR, EST AFRICAN AMERICAN: 58 mL/min — AB (ref >90)
Glucose, Bld: 181 mg/dL — ABNORMAL HIGH (ref 70–99)
Potassium: 3.9 mmol/L (ref 3.5–5.1)
Sodium: 141 mmol/L (ref 135–145)

## 2014-08-17 MED ORDER — IPRATROPIUM-ALBUTEROL 0.5-2.5 (3) MG/3ML IN SOLN
3.0000 mL | Freq: Four times a day (QID) | RESPIRATORY_TRACT | Status: DC
  Administered 2014-08-17 – 2014-08-18 (×5): 3 mL via RESPIRATORY_TRACT
  Filled 2014-08-17 (×6): qty 3

## 2014-08-17 MED ORDER — FUROSEMIDE 40 MG PO TABS
40.0000 mg | ORAL_TABLET | Freq: Two times a day (BID) | ORAL | Status: DC
  Administered 2014-08-17 – 2014-08-19 (×4): 40 mg via ORAL
  Filled 2014-08-17 (×6): qty 1

## 2014-08-17 MED ORDER — METHYLPREDNISOLONE SODIUM SUCC 40 MG IJ SOLR
40.0000 mg | Freq: Two times a day (BID) | INTRAMUSCULAR | Status: DC
  Administered 2014-08-17 – 2014-08-18 (×3): 40 mg via INTRAVENOUS
  Filled 2014-08-17 (×5): qty 1

## 2014-08-17 NOTE — Progress Notes (Addendum)
Patient ID: [name] [name], male   DOB: 12-15-1931, 81 y.o.   MRN: [id number]  [facility] PROGRESS NOTE  [name] [name] QMV:[id number] DOB: Aug 05, 1931 DOA: 08/09/2014 PCP: Hoyle Sauer, MD  Brief narrative:    81 y.o. male has a past medical history significant for COPD, coronary artery disease, hypertension, hyperlipidemia, severe dementia, is being brought from the nursing home with lethargy and decreased responsiveness. Found to have hypoxic respiratory failure.   Major events since admission: 3/3- worsening respiratory status, CXR with no new changes but unable to wean off steroids and had to increase the dose and frequency to 60 mg IV Q4 3/4 - better respiratory status but remains more confused, wife requested palliative care consultation  3/5 - less wheezing on exam but remains confused 3/6 - respiratory status even better with minimal wheezing, ate whole breakfast, less confused this AM  Assessment/Plan:    Principal Problem:  Acute encephalopathy - secondary to HCAP and acute COPD, underlying dementia - pt is more alert this am, less confused during the exam  - per wife clser to his baseline   Acute hypoxic respiratory failure secondary to HCAP, multilobar, acute diastolic CHF - Significant wheezing on exam with inspiration and expiration 3/3 - 3/5 but much better AM 3/6 - increased the Solu-Medrol to 60 mg IV every 6 hours 3/3 and will plan on tapering solumedrol down as pt with minimal wheezing on exam this AM - Continue bronchodilators scheduled and as needed - pt completed course of ABX 7 days therapy  Active Problems:  Acute on COPD exacerbation - very slow recovery and finally with less wheezing, plan on tapering off solumedrol  - continue BD's as noted above - Still requires oxygen via nasal cannula and will likely need it upon discharge   Dementia - stable but per wife rather advanced dementia at baseline  - PCT consulted, appreciate  recommendations   Hypertension - reasonable inpatient control   Severe PCM - in the context of acute on chronic illness - improved oral intake, tolerating diet well with no N/V - soft diet recommended with aspiration precautions   Leukocytosis - likely form PNA and steroid induced  - CBC in AM  Acute on chronic diastolic heart failure  - no prior 2-D echo in the system - 2D echo 2/28 with EF 55% and grade 1 diastolic dysfunction - responded well to IV diuresis initially, started on po Lasix 2/28 however weight up 3/1, net positive based on I&O - BMP with good renal function, slight BUN elevation due to diuresis  - RBBB which was not present in 2014, troponins flat, likely demand - He is not a candidate for invasive testing. - Weight is up from 176 pounds, 179 pounds 3/3 --> 176 --> 178 --> 181 lbs this AM - continue lasix but will increase the frequency from 40 mg PO QD to BID due to weight gain  - close monitoring or respiratory status with increased dose of Lasix    Functional quadriplegia - from acute illness - most;y bed bound - OOB to chair today, nursing staff made aware to get pt out of bed today and attempt to ambulate   Code Status: DNR Family Communication: plan of care discussed with the patient and wife at bedside who is agreeable to PCT  Disposition Plan: SNF possibly in 24-48 hours. I lowered the frequency of solumedrol from Q6 to BID and if respiratory status remains stable, possible transition to Prednisone, needs slow tapering due to  severe COPD  IV access:  Peripheral IV  Procedures and diagnostic studies:    Dg Chest 2 View 07/29/2014 Bibasilar infiltrates consistent with pneumonia. 2. Bibasilar atelectasis and scarring.   Dg Chest 2 View 07/15/2014 Partial but incomplete clearing of right middle lobe infiltrate. No new opacity. Underlying emphysema. No change in cardiac silhouette.   Dg Chest Port 1 View 08/12/2014 Diffuse interstitial  prominence compatible with interstitial pulmonary edema. Scarring at the RIGHT base with probable small residual RIGHT pleural effusion.   Dg Chest Port 1 View 08/09/2014 Small bilateral pleural effusions. Central interstitial pattern suggest edema or interstitial pneumonia.   Dg Swallowing Func-speech Pathology 08/12/2014 Aspiration noted with thin liquid, nectar thick liquids, honey, and Puree   Dg Chest 2 View  08/14/2014  No acute cardiopulmonary disease. Interstitial thickening noted previously has improved. There is no evidence of pulmonary edema. 2. Chronic changes of COPD and lung base scarring and/or subsegmental atelectasis.     Medical Consultants:  PCT  Other Consultants:  PT SLP  IAnti-Infectives:   Vancomycin 2/27 >> 3/1 Aztreonam 2/27 >> 3/1 Levofloxacin 3/1 >> 3/5  Debbora Presto, MD  [facility] Pager [phone]  If 7PM-7AM, please contact night-coverage [email or web address] Password [email or web address] 08/17/2014, 2:16 PM   LOS: 8 days   HPI/Subjective: No events overnight.   Objective: Filed Vitals:   08/17/14 0445 08/17/14 0516 08/17/14 0921 08/17/14 1310  BP: 114/60     Pulse: 65     Temp: 97.5 F (36.4 C)     TempSrc: Oral     Resp: 18     Height:      Weight:  82.373 kg (181 lb 9.6 oz)    SpO2: 93%  92% 94%    Intake/Output Summary (Last 24 hours) at 08/17/14 1416 Last data filed at 08/17/14 1034  Gross per 24 hour  Intake    963 ml  Output    300 ml  Net    663 ml    Exam:   General:  Pt is alert, follows some commands appropriately, still confused at times   Cardiovascular: Regular rate and rhythm,  no rubs, no gallops  Respiratory: better air movement in the upper lobes and less exp wheezing, mild bibasilar crackles   Abdomen: Soft, non tender, non distended, bowel sounds present, no guarding  Extremities: pulses DP and PT palpable bilaterally, trace bilateral LE edema   Neuro: Grossly nonfocal but confused at time   Data Reviewed: Basic  Metabolic Panel:  Recent Labs Lab 08/13/14 0500 08/14/14 0600 08/15/14 0530 08/16/14 0401 08/17/14 0528  NA 142 141 142 139 141  K 3.9 3.5 3.6 3.5 3.9  CL 102 96 93* 94* 95*  CO2 35* 37* 40* 37* 39*  GLUCOSE 150* 158* 176* 155* 181*  BUN 47* 42* 44* 46* 48*  CREATININE 1.27 1.07 1.19 1.09 1.29  CALCIUM 8.1* 7.9* 8.2* 7.9* 7.9*   CBC:  Recent Labs Lab 08/13/14 0500 08/14/14 0600 08/15/14 0530 08/16/14 0401 08/17/14 0528  WBC 14.0* 13.2* 11.9* 13.8* 13.9*  HGB 11.7* 12.1* 12.9* 11.8* 11.9*  HCT 37.9* 38.1* 41.0 37.4* 36.2*  MCV 88.1 86.4 87.8 86.6 87.0  PLT 235 213 207 177 170   Cardiac Enzymes:  Recent Labs Lab 08/11/14 0820  TROPONINI 0.10*   Recent Results (from the past 240 hour(s))  Blood Culture (routine x 2)     Status: None   Collection Time: 08/09/14  4:37 PM  Result Value Ref Range Status   Specimen Description  BLOOD RIGHT ANTECUBITAL  Final   Special Requests BOTTLES DRAWN AEROBIC AND ANAEROBIC 5CC EACH  Final   Culture   Final    NO GROWTH 5 DAYS Performed at [facility]    Report Status 08/15/2014 FINAL  Final  Blood Culture (routine x 2)     Status: None   Collection Time: 08/09/14  4:51 PM  Result Value Ref Range Status   Specimen Description BLOOD LEFT ANTECUBITAL  Final   Special Requests BOTTLES DRAWN AEROBIC AND ANAEROBIC 5CC EACH  Final   Culture   Final    NO GROWTH 5 DAYS Performed at [facility]    Report Status 08/15/2014 FINAL  Final  MRSA PCR Screening     Status: None   Collection Time: 08/09/14  7:32 PM  Result Value Ref Range Status   MRSA by PCR NEGATIVE NEGATIVE Final  Urine culture     Status: None   Collection Time: 08/09/14  7:33 PM  Result Value Ref Range Status   Specimen Description URINE, CLEAN CATCH  Final   Special Requests NONE  Final   Colony Count   Final    8,000 COLONIES/ML Performed at [facility]    Culture   Final    INSIGNIFICANT GROWTH Performed at [facility]    Report Status 08/11/2014 FINAL  Final     Scheduled Meds: . aspirin  325 mg Oral Daily  . budesonide  0.25 mg Nebulization BID  . divalproex  250 mg Oral BID  . donepezil  20 mg Oral QHS  . enoxaparin injection  40 mg Subcutaneous Q24H  . furosemide  40 mg Oral Daily  . guaiFENesin  600 mg Oral BID  . ipratropium-albuterol  3 mL Nebulization QID  . levofloxacin   750 mg Intravenous Q24H  . loratadine  10 mg Oral Daily  . methylPREDNISolone   60 mg Intravenous 4 times per day  . pravastatin  40 mg Oral QHS   Continuous Infusions:

## 2014-08-17 NOTE — Progress Notes (Signed)
CARE MANAGEMENT NOTE 08/17/2014  Patient:  ANTONIO, CRESWELL   Account Number:  [id number]  Date Initiated:  08/17/2014  Documentation initiated by:  [facility]  Subjective/Objective Assessment:   HCAP     Action/Plan:   home with Hospice   Anticipated DC Date:  08/18/2014   Anticipated DC Plan:  SKILLED NURSING FACILITY  In-house referral  Clinical Social Worker      DC Planning Services  CM consult      Choice offered to / List presented to:             Status of service:  Completed, signed off Medicare Important Message given?  YES (If response is "NO", the following Medicare IM given date fields will be blank) Date Medicare IM given:  08/17/2014 Medicare IM given by:  [facility] Date Additional Medicare IM given:   Additional Medicare IM given by:    Discharge Disposition:  SKILLED NURSING FACILITY  Per UR Regulation:    If discussed at Long Length of Stay Meetings, dates discussed:    Comments:  08/17/2014 1430 NCM spoke to pt and gave permission to speak to wife and dtr. Wife states she wants pt to go back to [facility]. She does not feel she can care for him at home until he is stronger. CSW referral for SNF placement. Isidoro Donning RN CCM Case Mgmt phone [phone]

## 2014-08-17 NOTE — Progress Notes (Signed)
Patient up to chair at 1500.  Chair alarm on. Family at bedside.

## 2014-08-17 NOTE — Progress Notes (Signed)
Pt agitated and more confused this evening. Repeatedly calling out, removing o2, condom cath, and clothes. Will continue to monitor.

## 2014-08-18 DIAGNOSIS — J9601 Acute respiratory failure with hypoxia: Secondary | ICD-10-CM | POA: Diagnosis present

## 2014-08-18 DIAGNOSIS — R410 Disorientation, unspecified: Secondary | ICD-10-CM

## 2014-08-18 DIAGNOSIS — Z515 Encounter for palliative care: Secondary | ICD-10-CM

## 2014-08-18 LAB — BASIC METABOLIC PANEL
Anion gap: 7 (ref 5–15)
BUN: 50 mg/dL — AB (ref 6–23)
CHLORIDE: 89 mmol/L — AB (ref 96–112)
CO2: 42 mmol/L (ref 19–32)
Calcium: 7.8 mg/dL — ABNORMAL LOW (ref 8.4–10.5)
Creatinine, Ser: 1.27 mg/dL (ref 0.50–1.35)
GFR calc non Af Amer: 51 mL/min — ABNORMAL LOW (ref >90)
GFR, EST AFRICAN AMERICAN: 59 mL/min — AB (ref >90)
Glucose, Bld: 185 mg/dL — ABNORMAL HIGH (ref 70–99)
POTASSIUM: 3.5 mmol/L (ref 3.5–5.1)
Sodium: 138 mmol/L (ref 135–145)

## 2014-08-18 LAB — CBC
HCT: 38 % — ABNORMAL LOW (ref 39.0–52.0)
Hemoglobin: 12.1 g/dL — ABNORMAL LOW (ref 13.0–17.0)
MCH: 27.6 pg (ref 26.0–34.0)
MCHC: 31.8 g/dL (ref 30.0–36.0)
MCV: 86.6 fL (ref 78.0–100.0)
Platelets: 154 10*3/uL (ref 150–400)
RBC: 4.39 MIL/uL (ref 4.22–5.81)
RDW: 13.4 % (ref 11.5–15.5)
WBC: 12.8 10*3/uL — AB (ref 4.0–10.5)

## 2014-08-18 MED ORDER — TRAZODONE HCL 50 MG PO TABS
50.0000 mg | ORAL_TABLET | Freq: Every day | ORAL | Status: DC
  Administered 2014-08-18: 50 mg via ORAL
  Filled 2014-08-18 (×2): qty 1

## 2014-08-18 MED ORDER — POTASSIUM CHLORIDE CRYS ER 20 MEQ PO TBCR
40.0000 meq | EXTENDED_RELEASE_TABLET | Freq: Once | ORAL | Status: AC
  Administered 2014-08-18: 40 meq via ORAL
  Filled 2014-08-18: qty 2

## 2014-08-18 MED ORDER — HALOPERIDOL LACTATE 5 MG/ML IJ SOLN
2.0000 mg | Freq: Four times a day (QID) | INTRAMUSCULAR | Status: DC | PRN
  Administered 2014-08-18: 2 mg via INTRAVENOUS
  Filled 2014-08-18: qty 1

## 2014-08-18 NOTE — Progress Notes (Signed)
[facility] PROGRESS NOTE  [name] [name] LEX:[id number] DOB: [birth year] DOA: 08/09/2014 PCP: Hoyle Sauer, MD  Brief narrative:    81 y.o. male has a past medical history significant for COPD, coronary artery disease, hypertension, hyperlipidemia, severe dementia, is being brought from the nursing home with lethargy and decreased responsiveness. Found to have hypoxic respiratory failure.   Major events since admission: 3/3- worsening respiratory status, CXR with no new changes but unable to wean off steroids and had to increase the dose and frequency to 60 mg IV Q4 3/4 - better respiratory status but remains more confused, wife requested palliative care consultation  3/5 - less wheezing on exam but remains confused 3/6 - respiratory status even better with minimal wheezing, ate whole breakfast, less confused this AM         Assessment/Plan: #1 Acute encephalopathy/Acute delirium Likely secondary to sundowning with underlying dementia, healthcare associated pneumonia and acute COPD exacerbation. Patient less confused. Patient following commands this morning. Will discontinue benzodiazepines. Placed on Haldol as needed. Will schedule trazodone daily at bedtime.  #2 acute hypoxic respiratory failure secondary to HCAP/multilobar/acute diastolic CHF Clinical improvement. Patient still with some wheezing noted on examination. Continue to taper IV Solu-Medrol and transitioned to oral prednisone with a slow taper tomorrow. Continue bronchodilators. Patient is status post 7 days antibiotic therapy. Follow.  #3 acute COPD exacerbation Patient improving slowly. Continue slow steroid taper. Bronchodilators. Follow.  #4 dementia Patient with advanced dementia at baseline. Palliative care has been consulted. Will discontinue benzodiazepines. Place on Haldol as needed for agitation. Place on scheduled dose trazodone at bedtime. Will likely need to follow-up with palliative care at the  facility.  #5 hypertension Stable.  #6 leukocytosis Likely secondary to pneumonia and steroids. WBC trending down. Continue steroid taper. Patient status post a course of antibiotic treatment. Follow.  #7 severe protein calorie malnutrition Continue current diet. Aspiration precautions.  #8 acute on chronic diastolic heart failure 2-D echo from 08/10/2014 with EF of 55% and grade 1 diastolic dysfunction. Patient improved clinically on IV diuresis. Patient currently on oral diuretics. Patient likely not a candidate for invasive testing. Patient's current weight is 177 pounds. Continue current regimen of oral diuretics. Outpatient follow-up.  #9 functional quadriplegia Patient is mostly bedbound however uses a wheelchair. PT/OT.   Code Status: DO NOT RESUSCITATE Family Communication: Updated patient and wife at bedside. Disposition Plan: Back to skilled nursing facility in 1-2 days. Will need palliative care to follow at facility.   Consultants:  Palliative care: Dr. Greig Right 08/16/2014  Procedures:  Chest x-ray 08/09/2014, 08/12/2014, 08/14/2014  2-D echo 08/10/2014  Antibiotics: Vancomycin 2/27 >> 3/1 Aztreonam 2/27 >> 3/1 Levofloxacin 3/1 >> 3/5  HPI/Subjective: Patient with no complaints. Wants to go home.  Objective: Filed Vitals:   08/18/14 0412  BP: 135/66  Pulse: 72  Temp: 97.5 F (36.4 C)  Resp: 20    Intake/Output Summary (Last 24 hours) at 08/18/14 1127 Last data filed at 08/18/14 0425  Gross per 24 hour  Intake    240 ml  Output   1275 ml  Net  -1035 ml   Filed Weights   08/16/14 0548 08/17/14 0516 08/18/14 0412  Weight: 81.058 kg (178 lb 11.2 oz) 82.373 kg (181 lb 9.6 oz) 80.332 kg (177 lb 1.6 oz)    Exam:   General:  nad  Cardiovascular: RRR  Respiratory: MIN-MILD EXP WHEEZING  Abdomen: SOFT/NT/ND/+BS  Musculoskeletal: No c/c/e  Data Reviewed: Basic Metabolic Panel:  Recent Labs Lab 08/14/14  0600 08/15/14 0530 08/16/14 0401  08/17/14 0528 08/18/14 0426  NA 141 142 139 141 138  K 3.5 3.6 3.5 3.9 3.5  CL 96 93* 94* 95* 89*  CO2 37* 40* 37* 39* 42*  GLUCOSE 158* 176* 155* 181* 185*  BUN 42* 44* 46* 48* 50*  CREATININE 1.07 1.19 1.09 1.29 1.27  CALCIUM 7.9* 8.2* 7.9* 7.9* 7.8*   Liver Function Tests: No results for input(s): AST, ALT, ALKPHOS, BILITOT, PROT, ALBUMIN in the last 168 hours. No results for input(s): LIPASE, AMYLASE in the last 168 hours. No results for input(s): AMMONIA in the last 168 hours. CBC:  Recent Labs Lab 08/14/14 0600 08/15/14 0530 08/16/14 0401 08/17/14 0528 08/18/14 0426  WBC 13.2* 11.9* 13.8* 13.9* 12.8*  HGB 12.1* 12.9* 11.8* 11.9* 12.1*  HCT 38.1* 41.0 37.4* 36.2* 38.0*  MCV 86.4 87.8 86.6 87.0 86.6  PLT 213 207 177 170 154   Cardiac Enzymes: No results for input(s): CKTOTAL, CKMB, CKMBINDEX, TROPONINI in the last 168 hours. BNP (last 3 results)  Recent Labs  07/10/14 1445 08/09/14 1650  BNP 67.9 41.5    ProBNP (last 3 results)  Recent Labs  04/12/14 1048  PROBNP 616.1*    CBG: No results for input(s): GLUCAP in the last 168 hours.  Recent Results (from the past 240 hour(s))  Blood Culture (routine x 2)     Status: None   Collection Time: 08/09/14  4:37 PM  Result Value Ref Range Status   Specimen Description BLOOD RIGHT ANTECUBITAL  Final   Special Requests BOTTLES DRAWN AEROBIC AND ANAEROBIC 5CC EACH  Final   Culture   Final    NO GROWTH 5 DAYS Performed at [facility]    Report Status 08/15/2014 FINAL  Final  Blood Culture (routine x 2)     Status: None   Collection Time: 08/09/14  4:51 PM  Result Value Ref Range Status   Specimen Description BLOOD LEFT ANTECUBITAL  Final   Special Requests BOTTLES DRAWN AEROBIC AND ANAEROBIC 5CC EACH  Final   Culture   Final    NO GROWTH 5 DAYS Performed at [facility]    Report Status 08/15/2014 FINAL  Final  MRSA PCR Screening     Status: None   Collection Time: 08/09/14  7:32 PM   Result Value Ref Range Status   MRSA by PCR NEGATIVE NEGATIVE Final    Comment:        The GeneXpert MRSA Assay (FDA approved for NASAL specimens only), is one component of a comprehensive MRSA colonization surveillance program. It is not intended to diagnose MRSA infection nor to guide or monitor treatment for MRSA infections.   Urine culture     Status: None   Collection Time: 08/09/14  7:33 PM  Result Value Ref Range Status   Specimen Description URINE, CLEAN CATCH  Final   Special Requests NONE  Final   Colony Count   Final    8,000 COLONIES/ML Performed at [facility]    Culture   Final    INSIGNIFICANT GROWTH Performed at [facility]    Report Status 08/11/2014 FINAL  Final     Studies: No results found.  Scheduled Meds: . antiseptic oral rinse  7 mL Mouth Rinse q12n4p  . aspirin  325 mg Oral Daily  . budesonide  0.25 mg Nebulization BID  . chlorhexidine  15 mL Mouth Rinse BID  . divalproex  250 mg Oral BID  . donepezil  20 mg Oral QHS  . enoxaparin (LOVENOX) injection  40 mg Subcutaneous Q24H  . furosemide  40 mg Oral BID  . guaiFENesin  600 mg Oral BID  . ipratropium-albuterol  3 mL Nebulization Q6H WA  . loratadine  10 mg Oral Daily  . methylPREDNISolone (SOLU-MEDROL) injection  40 mg Intravenous Q12H  . pravastatin  40 mg Oral QHS  . sodium chloride  3 mL Intravenous Q12H   Continuous Infusions:   Principal Problem:   HCAP (healthcare-associated pneumonia) Active Problems:   COPD exacerbation   Dementia   Hypertension   Acute exacerbation of CHF (congestive heart failure)   Respiratory failure with hypoxia   Acute delirium   Palliative care encounter    Time spent: 66 MINS    [facility] MD [facility] Pager [phone]. If 7PM-7AM, please contact night-coverage at [email or web address], password [facility] 08/18/2014, 11:27 AM  LOS: 9 days

## 2014-08-18 NOTE — Progress Notes (Signed)
Physical Therapy Treatment Patient Details Name: [name] [name] MRN: [id number] DOB: 1931/10/01 Today's Date: 08/18/2014    History of Present Illness 81 yo male admitted with Pna. Hx of COPD, HTN, severe dementia, RA, back pain. Pt is from SNF-participating with therapies.     PT Comments    Continues to require +2 assist for mobility. Able to stand x2 with RW for brief period-multimodal cueing and external assist to encourage extension/maintain upright posture. Stand pivot with Rw from chair to bed. Pt remains very weak and unsteady. Remained on [place] O2 throughout session.   Follow Up Recommendations  SNF     Equipment Recommendations  Rolling walker with 5" wheels    Recommendations for Other Services       Precautions / Restrictions Precautions Precautions: Fall Precaution Comments: monitor sats Restrictions Weight Bearing Restrictions: No    Mobility  Bed Mobility Overal bed mobility: Needs Assistance Bed Mobility: Sit to Supine       Sit to supine: Mod assist   General bed mobility comments: Assist for LEs and trunk.   Transfers Overall transfer level: Needs assistance   Transfers: Sit to/from Stand Sit to Stand: Mod assist;+2 physical assistance Stand pivot transfers: Mod assist;+2 physical assistance       General transfer comment: x2. Assist to rise, stabilize, control descent. Multimodal cues for safety, technique, hand placement.   Ambulation/Gait             General Gait Details: NT-pt fatigues quickly/had difficuly maintaining upright posture   Stairs            Wheelchair Mobility    Modified Rankin (Stroke Patients Only)       Balance           Standing balance support: Bilateral upper extremity supported;During functional activity Standing balance-Leahy Scale: Poor                      Cognition Arousal/Alertness: Awake/alert Behavior During Therapy: WFL for tasks assessed/performed Overall Cognitive  Status: Impaired/Different from baseline Area of Impairment: Memory;Safety/judgement;Following commands;Problem solving     Memory: Decreased recall of precautions;Decreased short-term memory Following Commands: Follows one step commands with increased time     Problem Solving: Requires verbal cues;Requires tactile cues;Difficulty sequencing      Exercises      General Comments        Pertinent Vitals/Pain Pain Assessment: No/denies pain    Home Living                      Prior Function            PT Goals (current goals can now be found in the care plan section) Progress towards PT goals: Progressing toward goals (slowly)    Frequency  Min 3X/week    PT Plan Current plan remains appropriate    Co-evaluation             End of Session Equipment Utilized During Treatment: Gait belt;Oxygen Activity Tolerance: Patient limited by fatigue Patient left: in bed;with call bell/phone within reach;with family/visitor present;with bed alarm set     Time: 1548-1600 PT Time Calculation (min) (ACUTE ONLY): 12 min  Charges:  $Therapeutic Activity: 8-22 mins                    G Codes:      Rebeca Alert, MPT Pager: [phone]

## 2014-08-18 NOTE — Progress Notes (Signed)
Patient OB:[id number] [name] [name]      DOB: 1932-02-05      OQH:[id number]   Palliative Medicine Team at [facility] Progress Note    Subjective: Pleasant this morning. Confusion a little better.  No pain. Some swelling of legs.    Filed Vitals:   08/18/14 1441  BP: 127/66  Pulse: 76  Temp: 98.3 F (36.8 C)  Resp: 20   Physical exam: General: Alert, NAD HEENT: [place], sclera anicteric Chest: CTAB CVS: regular rate Abdomen: soft, ND Ext: no edema   Assessment and plan: 81 yo male with COPD, CAD, diastolic HF, advanced dementia admitted with lethargy, resp failure.   1. Code Status: DNR  2. GOC: See initial consult. Talked further with wife.  She does not feel like she can care for him home. His agitation has been particularly bad at night.  She wants to try rehab again to see if he can gain some strength and hopefully make home a more successful option. She is very interested in outpatient palliative care consultation at SNF. Please place recommendation in dc summary.  I think she will likely try to transition to home hospice care after SNF/rehab.   3. Symptom Management:  Acute Delirium: agitation worse at night.  Has history of responding poorly to benzos. I would recommend d/c of ativan and consideration of scheduled trazodone qhs  4. Psychosocial/Spiritual:  Married to wife Arna Medici for 42 years and was previously at home with her until recent PNA admission and subsequent d/c to SNF rehab.   Orvis Brill D.O. Palliative Medicine Team at [facility]  Pager: [phone] Team Phone: [phone]

## 2014-08-18 NOTE — Progress Notes (Signed)
CRITICAL VALUE ALERT  Critical value received:  CO2-42  Date of notification:  08/18/14  Time of notification:  509  Critical value read back:Yes.    Nurse who received alert:  Eugenio Hoes   MD notified (1st page):  Camila Li, Hospitalist  Time of first page:  510  Will continue to monitor.

## 2014-08-18 NOTE — Progress Notes (Signed)
Speech Language Pathology Treatment: Dysphagia  Patient Details Name: [name] [name] MRN: [id number] DOB: 1932-03-01 Today's Date: 08/18/2014 Time: 1450-1506 SLP Time Calculation (min) (ACUTE ONLY): 16 min  Assessment / Plan / Recommendation Clinical Impression  Pt seated in chair, wife present. Pt was feeding himself a grilled cheese sandwich. Assistance given with thin liquids. No overt s/s aspiration observed or reported. RN reports good appetite. Reviewed MBS results with pt/wife and provided updated safe swallow precaution sheet for wife to take to SNF at DC. Wife read through each suggestion, and indicated that she would be present for lunch and dinner each day. Opportunity given to ask questions. All goals met. DC ST.   HPI HPI: 81 year old male admitted 08/09/14 due to lethargy and decreased responsiveness. PMH significant for COPD, CAD, HTN, HLD, severe dementia. BSE completed with recommendations for MBS to rule out silent aspiration.  ST in to assess diet tolerance and complete family education.   Pertinent Vitals Pain Assessment: No/denies pain  SLP Plan  All goals met;Discharge SLP treatment due to (comment)    Recommendations Diet recommendations: Dysphagia 3 (mechanical soft);Thin liquid Liquids provided via: Straw Medication Administration: Whole meds with puree Supervision: Staff to assist with self feeding;Full supervision/cueing for compensatory strategies Compensations: Slow rate;Small sips/bites;Follow solids with liquid (begin meals with warm liquid) Postural Changes and/or Swallow Maneuvers: Seated upright 90 degrees;Upright 30-60 min after meal              Oral Care Recommendations: Oral care BID Follow up Recommendations: Skilled Nursing facility Plan: All goals met;Discharge SLP treatment due to (comment)    GO   Maisee Vollman B. Quentin Ore [facility], CCC-SLP [phone]  Shonna Chock 08/18/2014, 3:07 PM

## 2014-08-18 NOTE — Progress Notes (Signed)
Pt has been threatening staff with harm stating we are only here to hurt him and that he has never been sick and everyone is lying and making everything up to keep him here so we can hurt him. Pt has been pulling off condom catheter and peri care has been completed by staff. Pt states he has never "peed himself" and someone else is doing it. Have attempted to reorient patient, but he continues to curse at staff and threaten them. Will continue to monitor.

## 2014-08-19 DIAGNOSIS — I10 Essential (primary) hypertension: Secondary | ICD-10-CM

## 2014-08-19 LAB — BASIC METABOLIC PANEL
Anion gap: 9 (ref 5–15)
BUN: 56 mg/dL — ABNORMAL HIGH (ref 6–23)
CHLORIDE: 91 mmol/L — AB (ref 96–112)
CO2: 41 mmol/L (ref 19–32)
Calcium: 8.1 mg/dL — ABNORMAL LOW (ref 8.4–10.5)
Creatinine, Ser: 1.28 mg/dL (ref 0.50–1.35)
GFR calc Af Amer: 59 mL/min — ABNORMAL LOW (ref >90)
GFR calc non Af Amer: 51 mL/min — ABNORMAL LOW (ref >90)
Glucose, Bld: 197 mg/dL — ABNORMAL HIGH (ref 70–99)
POTASSIUM: 4.6 mmol/L (ref 3.5–5.1)
SODIUM: 141 mmol/L (ref 135–145)

## 2014-08-19 LAB — CBC
HEMATOCRIT: 36.5 % — AB (ref 39.0–52.0)
Hemoglobin: 11.7 g/dL — ABNORMAL LOW (ref 13.0–17.0)
MCH: 27.7 pg (ref 26.0–34.0)
MCHC: 32.1 g/dL (ref 30.0–36.0)
MCV: 86.5 fL (ref 78.0–100.0)
PLATELETS: 131 10*3/uL — AB (ref 150–400)
RBC: 4.22 MIL/uL (ref 4.22–5.81)
RDW: 13.8 % (ref 11.5–15.5)
WBC: 10.9 10*3/uL — ABNORMAL HIGH (ref 4.0–10.5)

## 2014-08-19 MED ORDER — ALBUTEROL SULFATE (2.5 MG/3ML) 0.083% IN NEBU: 2.5000 mg | INHALATION_SOLUTION | Freq: Four times a day (QID) | RESPIRATORY_TRACT | Status: DC | PRN

## 2014-08-19 MED ORDER — PREDNISONE 50 MG PO TABS
60.0000 mg | ORAL_TABLET | Freq: Every day | ORAL | Status: DC
  Administered 2014-08-19: 60 mg via ORAL
  Filled 2014-08-19 (×2): qty 1

## 2014-08-19 MED ORDER — HALOPERIDOL 1 MG PO TABS: 1.0000 mg | ORAL_TABLET | Freq: Three times a day (TID) | ORAL | Status: AC | PRN

## 2014-08-19 MED ORDER — TRAZODONE HCL 50 MG PO TABS: 25.0000 mg | ORAL_TABLET | Freq: Every day | ORAL | Status: AC

## 2014-08-19 MED ORDER — FUROSEMIDE 40 MG PO TABS: 40.0000 mg | ORAL_TABLET | Freq: Two times a day (BID) | ORAL | Status: AC

## 2014-08-19 MED ORDER — ENOXAPARIN SODIUM 40 MG/0.4ML ~~LOC~~ SOLN: 40.0000 mg | SUBCUTANEOUS | Status: AC

## 2014-08-19 MED ORDER — IPRATROPIUM-ALBUTEROL 0.5-2.5 (3) MG/3ML IN SOLN
3.0000 mL | Freq: Three times a day (TID) | RESPIRATORY_TRACT | Status: DC
  Administered 2014-08-19: 3 mL via RESPIRATORY_TRACT
  Filled 2014-08-19: qty 3

## 2014-08-19 MED ORDER — GUAIFENESIN ER 600 MG PO TB12: 600.0000 mg | ORAL_TABLET | Freq: Two times a day (BID) | ORAL | Status: AC | PRN

## 2014-08-19 MED ORDER — PREDNISONE 20 MG PO TABS: 60.0000 mg | ORAL_TABLET | Freq: Every day | ORAL | Status: DC

## 2014-08-19 MED ORDER — POTASSIUM CHLORIDE ER 20 MEQ PO TBCR: 20.0000 meq | EXTENDED_RELEASE_TABLET | Freq: Every day | ORAL | Status: AC

## 2014-08-19 NOTE — Discharge Summary (Signed)
Physician Discharge Summary  [name] [name] UEA:[id number] DOB: 29-Jan-1932 DOA: 08/09/2014  PCP: Hoyle Sauer, MD  Admit date: 08/09/2014 Discharge date: 08/19/2014  Time spent: 70 minutes  Recommendations for Outpatient Follow-up:  1. Patient will be discharged to [facility] skilled nursing facility. Patient will follow-up with M.D. at the skilled nursing facility. Patient needs a basic metabolic profile done in 1 week to follow-up on electrolytes and renal function. 2. Patient will need palliative care consultation to follow-up patient at the skilled nursing facility. 3. Patient will be discharged on a mechanical soft heart healthy diet. Patient will need help with feeding. Patient will need aspiration precautions implemented.  Discharge Diagnoses:  Principal Problem:   Acute respiratory failure with hypoxia Active Problems:   Acute exacerbation of CHF (congestive heart failure)   COPD exacerbation   HCAP (healthcare-associated pneumonia)   Dementia   Hypertension   Respiratory failure with hypoxia   Acute delirium   Palliative care encounter   Discharge Condition: stable  Diet recommendation: heart healthy, mechanical soft  Filed Weights   08/17/14 0516 08/18/14 0412 08/19/14 0710  Weight: 82.373 kg (181 lb 9.6 oz) 80.332 kg (177 lb 1.6 oz) 80.604 kg (177 lb 11.2 oz)    History of present illness:  [name] [name] is a 81 y.o. male has a past medical history significant for COPD, coronary artery disease, hypertension, hyperlipidemia, severe dementia, who was brought from the nursing home with lethargy and decreased responsiveness on the day of admission. In the emergency room, patient was lethargic, wakes up and is able to answer yes no questions, he denied any pain or breathing difficulties. He was alert to person only. His wife was in the room and she told admitting physician, that there are no reported fever or chills, he had never complained of any pain, no  abdominal pain nausea or vomiting, he had one episode of loose stool one day prior to admission however not on the day of admission. His wife also reported that his legs have been progressively being more swollen in the last days. In the emergency room, patient had a low-grade temp of 99.8, he was tachycardic to 111, his blood pressure is stable. Blood work with mild anemia, without leukocytosis, and a chest x-ray showed small bilateral pleural effusions with a pattern? Edema/pneumonia. TRH was asked for admission for HCAP vs CHF. He was given vancomycin and aztreonam in the emergency room.   Hospital Course:  Brief narrative:    81 y.o. male has a past medical history significant for COPD, coronary artery disease, hypertension, hyperlipidemia, severe dementia, is being brought from the nursing home with lethargy and decreased responsiveness. Found to have hypoxic respiratory failure.   Major events since admission: 3/3- worsening respiratory status, CXR with no new changes but unable to wean off steroids and had to increase the dose and frequency to 60 mg IV Q4 3/4 - better respiratory status but remains more confused, wife requested palliative care consultation  3/5 - less wheezing on exam but remains confused 3/6 - respiratory status even better with minimal wheezing, ate whole breakfast, less confused this AM         #1 Acute encephalopathy/Acute delirium Likely secondary to sundowning with underlying dementia, healthcare associated pneumonia and acute COPD exacerbation. Patient confusion improved during the hospitalization with treatment of his pneumonia and diuresis. As a diazepam as were discontinued. Patient was placed on Haldol as needed for anxiety and agitation. Was recommended the patient be placed back on his  trazodone dose. Patient however was drowsy the next day and a such trazodone dose has been decreased to 25 mg at bedtime. Patient will follow-up with M.D. a skilled nursing  facility. If needed trazodone dose can further be decreased however will defer to MD at the skilled nursing facility.  #2 acute hypoxic respiratory failure secondary to HCAP/multilobar/acute diastolic CHF On admission patient was noted to be in acute hypoxic respiratory distress. Chest x-ray which was done was consistent with Health care associated pneumonia and possible volume overload/pulmonary edema and COPD exacerbation. Patient was placed on IV steroids, oxygen, nebulizer treatments, IV Lasix and empiric IV antibiotics initially on vancomycin and aztreonam. Patient was noted to have wheezing and a such IV steroid dose was adjusted. Patient received a total of 7 days of empiric antibiotics and does does not recur any further antibiotics. Patient improved clinically. IV steroids was subsequently tapered to oral prednisone and patient be discharged on a slow taper of oral prednisone. Patient will be discharged in stable condition.   #3 acute COPD exacerbation Patient was noted to be hypoxic respiratory failure felt to be multifactorial secondary to healthcare associated pneumonia, acute diastolic heart failure and COPD exacerbation. Patient was placed on IV steroids, mucolytics, nebulizer treatments, oxygen. Patient was noted to have worsening respiratory status and a such IV steroid dose was increased. Patient improved clinically with improvement in his wheezing. Patient be discharged on a long steroid taper and will follow-up with M.D. at the facility.  #4 dementia Patient with advanced dementia at baseline. Palliative care was consulted. Palliative care recommended to discontinue benzodiazepines. Place on Haldol as needed for agitation. Place on scheduled dose trazodone at bedtime. Patient trazodone dose will be decreased to 25 mg at bedtime as patient was noted to be drowsy on his home regimen of 50 mg daily. Patient will need to follow-up with palliative care at the facility.  #5  hypertension Stable.  #6 leukocytosis Likely secondary to pneumonia and steroids. WBC trending down. Patient was on IV steroids during the hospitalization for an acute COPD exacerbation. As steroids were tapered down WBC trended down. Patient received a full course of antibiotic therapy during the hospitalization. Outpatient follow-up.   #7 severe protein calorie malnutrition Patient was placed on a mechanical soft diet with aspiration precautions.   #8 acute on chronic diastolic heart failure Patient was noted on admission to have hypoxic respiratory failure. Patient was noted to be in acute on chronic diastolic heart failure during the hospitalization. 2-D echo from 08/10/2014 with EF of 55% and grade 1 diastolic dysfunction. Patient was placed on IV diuretics with good diuresis and clinical improvement. Patient diuresed well. Patient was subsequently transitioned to oral Lasix at 40 mg twice daily. Patient will follow-up with M.D. at skilled nursing facility. Patient will be discharged in stable and improved condition.   #9 functional quadriplegia Patient is mostly bedbound, however uses a wheelchair. PT/OT saw patient throughout hospitalization.    Procedures:  Chest x-ray 08/09/2014, 08/12/2014, 08/14/2014  2-D echo 08/10/2014    Consultations:  Palliative care: Dr. Greig Right 08/16/2014  Discharge Exam: Filed Vitals:   08/19/14 0710  BP: 124/60  Pulse: 63  Temp: 97.8 F (36.6 C)  Resp: 16    General: Sleeping Cardiovascular: RRR Respiratory: CTAB anterior lung fields  Discharge Instructions   Discharge Instructions    Diet - low sodium heart healthy    Complete by:  As directed   Mechanical sift diet.     Discharge instructions  Complete by:  As directed   Follow up with MD at SNF. Patient will need palliative care services to follow with at facility.     Increase activity slowly    Complete by:  As directed           Current Discharge Medication List     START taking these medications   Details  enoxaparin (LOVENOX) 40 MG/0.4ML injection Inject 0.4 mLs (40 mg total) into the skin daily. Qty: 0 Syringe    haloperidol (HALDOL) 1 MG tablet Take 1-2 tablets (1-2 mg total) by mouth every 8 (eight) hours as needed for agitation. Qty: 15 tablet, Refills: 0    potassium chloride 20 MEQ TBCR Take 20 mEq by mouth daily. Qty: 30 tablet, Refills: 0    predniSONE (DELTASONE) 20 MG tablet Take 3 tablets (60 mg total) by mouth daily before breakfast. Take 3 tablets (60mg ) daily for 4 days, then 2 tablets (40mg ) daily x 4 days, then 1 tablet (20mg ) daily x 4 days then stop. Qty: 24 tablet, Refills: 0      CONTINUE these medications which have CHANGED   Details  furosemide (LASIX) 40 MG tablet Take 1 tablet (40 mg total) by mouth 2 (two) times daily. Qty: 60 tablet, Refills: 0    guaiFENesin (MUCINEX) 600 MG 12 hr tablet Take 1 tablet (600 mg total) by mouth 2 (two) times daily as needed for cough or to loosen phlegm.    traZODone (DESYREL) 50 MG tablet Take 0.5 tablets (25 mg total) by mouth at bedtime. Qty: 20 tablet, Refills: 0      CONTINUE these medications which have NOT CHANGED   Details  acetaminophen (TYLENOL) 500 MG tablet Take 1,000 mg by mouth daily as needed for mild pain or headache.    albuterol (PROVENTIL) (2.5 MG/3ML) 0.083% nebulizer solution Take 3 mLs (2.5 mg total) by nebulization every 4 (four) hours as needed for wheezing. Qty: 75 mL, Refills: 3    aspirin 325 MG EC tablet Take 325 mg by mouth daily.      b complex vitamins tablet Take 1 tablet by mouth daily.    budesonide (PULMICORT) 0.25 MG/2ML nebulizer solution Take 2 mLs (0.25 mg total) by nebulization 2 (two) times daily. Qty: 60 mL, Refills: 12    cholecalciferol (VITAMIN D) 1000 UNITS tablet Take 1,000 Units by mouth daily.      divalproex (DEPAKOTE SPRINKLE) 125 MG capsule Take 250 mg by mouth 2 (two) times daily.    donepezil (ARICEPT) 10 MG tablet  Take 20 mg by mouth at bedtime.     famotidine (PEPCID) 20 MG tablet Take 20 mg by mouth as needed for heartburn.     ipratropium-albuterol (DUONEB) 0.5-2.5 (3) MG/3ML SOLN Take 3 mLs by nebulization every 6 (six) hours. Qty: 360 mL, Refills: 3    leflunomide (ARAVA) 20 MG tablet Take 20 mg by mouth daily.    loratadine (CLARITIN) 10 MG tablet Take 10 mg by mouth daily.    nitroGLYCERIN (NITROSTAT) 0.4 MG SL tablet Place 0.4 mg under the tongue every 5 (five) minutes x 3 doses as needed. For chest pain.    pravastatin (PRAVACHOL) 40 MG tablet take 1 tablet by mouth at bedtime Qty: 30 tablet, Refills: 12    PROAIR HFA 108 (90 BASE) MCG/ACT inhaler inhale 2 puffs by mouth every 4 hours if needed for wheezing Qty: 8.5 g, Refills: 3      STOP taking these medications     LORazepam (  ATIVAN) 0.5 MG tablet        Allergies  Allergen Reactions  . Iohexol Shortness Of Breath and Other (See Comments)     Desc: Increased difficulty breathing, Onset Date: [id number]   . Zolpidem Tartrate Other (See Comments)    REACTION: Acute delirium  . Penicillins Other (See Comments)    unknown   Follow-up Information    Please follow up.   Why:  f/u with MD at SNF      Please follow up.   Why:  Palliative care to follow patient at facility       The results of significant diagnostics from this hospitalization (including imaging, microbiology, ancillary and laboratory) are listed below for reference.    Significant Diagnostic Studies: Dg Chest 2 View  08/14/2014   CLINICAL DATA:  past medical history significant for COPD, coronary artery disease, hypertension, hyperlipidemia, severe dementia, is being brought from the nursing home with lethargy and decreased responsiveness. Sob and weakness today.  EXAM: CHEST  2 VIEW  COMPARISON:  08/12/2014  FINDINGS: Lungs are mildly hyperexpanded. Interstitial thickening noted on the prior study has improved, particularly in the mid and upper lungs. There  is persistent interstitial thickening and reticular opacity at the lung bases likely due to a combination of atelectasis and scarring. No lung consolidation is seen to suggest pneumonia. No pleural effusion or pneumothorax. Decrease markings in the upper lobes is consistent with emphysema.  Cardiac silhouette is normal in size and configuration. No mediastinal or hilar masses or evidence of adenopathy.  Bony thorax is demineralized but grossly intact.  IMPRESSION: 1. No acute cardiopulmonary disease. Interstitial thickening noted previously has improved. There is no evidence of pulmonary edema. 2. Chronic changes of COPD and lung base scarring and/or subsegmental atelectasis.   Electronically Signed   By: Amie Portland M.D.   On: 08/14/2014 12:28   Dg Chest 2 View  07/29/2014   CLINICAL DATA:  Pneumonia.  Cough.  EXAM: CHEST  2 VIEW  COMPARISON:  None.  FINDINGS: Mediastinum and hilar structures normal. Bibasilar pulmonary infiltrates. Basilar pleural parenchymal thickening consistent with scarring. Small pleural effusions cannot be excluded. No pneumothorax. Heart size normal. No acute bony abnormality.  IMPRESSION: 1.  Bibasilar infiltrates consistent with pneumonia.  2.  Bibasilar atelectasis and scarring.   Electronically Signed   By: Maisie Fus  Register   On: 07/29/2014 15:29   Dg Chest Port 1 View  08/12/2014   CLINICAL DATA:  Sudden onset wheezing and shortness of breath.  EXAM: PORTABLE CHEST - 1 VIEW  COMPARISON:  08/09/2014.  07/10/2014.  04/11/2014.  FINDINGS: Cardiopericardial silhouette upper limits of normal for projection. There is a diffuse interstitial pattern, most compatible with interstitial pulmonary edema. Scarring is present at the RIGHT lung base, scarring at the RIGHT lung base is likely secondary to previous pneumonia. Blunting of RIGHT costophrenic angle is compatible with residual pleural effusion. Aortic arch atherosclerosis.  IMPRESSION: Diffuse interstitial prominence compatible with  interstitial pulmonary edema. Scarring at the RIGHT base with probable small residual RIGHT pleural effusion.   Electronically Signed   By: Andreas Newport M.D.   On: 08/12/2014 21:06   Dg Chest Port 1 View  08/09/2014   CLINICAL DATA:  Shortness of breath and wheezing. Multiple diagnosis of pneumonia and bronchitis in the past.  EXAM: PORTABLE CHEST - 1 VIEW  COMPARISON:  07/29/2014  FINDINGS: Shallow inspiration. Normal heart size and pulmonary vascularity. Diffuse central interstitial pattern in the lungs may represent edema or  interstitial pneumonia. Blunting of the costophrenic angle suggesting small pleural effusions. Calcified and tortuous aorta. Degenerative changes in the spine and shoulders.  IMPRESSION: Small bilateral pleural effusions. Central interstitial pattern suggest edema or interstitial pneumonia.   Electronically Signed   By: Burman Nieves M.D.   On: 08/09/2014 17:10   Dg Swallowing Func-speech Pathology  08/12/2014   ADDENDUM REPORT: 08/12/2014 14:59  ADDENDUM: Please disregard the above report. The above reported belongs with a different medical record.   Electronically Signed   By: Signa Kell M.D.   On: 08/12/2014 14:59   08/12/2014   CLINICAL DATA:  Aspiration after colon cancer surgery. History of tracheostomy.  EXAM: MODIFIED BARIUM SWALLOW  TECHNIQUE: Different consistencies of barium were administered orally to the patient by the Speech Pathologist. Imaging of the pharynx was performed in the lateral projection.  FLUOROSCOPY TIME:  Fluoroscopy Time:  2 minutes 23 seconds  COMPARISON:  None.  FINDINGS: Thin liquid- aspiration  Nectar thick liquid- flash penetration and aspiration noted  Honey- aspiration noted.  Puree flash penetration, aspiration, and retention noted.  Cracker-not assessed  Puree with cracker- not assessed  Barium tablet -  not assessed  IMPRESSION: 1. Aspiration noted with thin liquid, nectar thick liquids, honey, and Puree  Please refer to the Speech  Pathologists report for complete details and recommendations.  Electronically Signed: By: Signa Kell M.D. On: 08/12/2014 14:01    Microbiology: Recent Results (from the past 240 hour(s))  Blood Culture (routine x 2)     Status: None   Collection Time: 08/09/14  4:37 PM  Result Value Ref Range Status   Specimen Description BLOOD RIGHT ANTECUBITAL  Final   Special Requests BOTTLES DRAWN AEROBIC AND ANAEROBIC 5CC EACH  Final   Culture   Final    NO GROWTH 5 DAYS Performed at [facility]    Report Status 08/15/2014 FINAL  Final  Blood Culture (routine x 2)     Status: None   Collection Time: 08/09/14  4:51 PM  Result Value Ref Range Status   Specimen Description BLOOD LEFT ANTECUBITAL  Final   Special Requests BOTTLES DRAWN AEROBIC AND ANAEROBIC 5CC EACH  Final   Culture   Final    NO GROWTH 5 DAYS Performed at [facility]    Report Status 08/15/2014 FINAL  Final  MRSA PCR Screening     Status: None   Collection Time: 08/09/14  7:32 PM  Result Value Ref Range Status   MRSA by PCR NEGATIVE NEGATIVE Final    Comment:        The GeneXpert MRSA Assay (FDA approved for NASAL specimens only), is one component of a comprehensive MRSA colonization surveillance program. It is not intended to diagnose MRSA infection nor to guide or monitor treatment for MRSA infections.   Urine culture     Status: None   Collection Time: 08/09/14  7:33 PM  Result Value Ref Range Status   Specimen Description URINE, CLEAN CATCH  Final   Special Requests NONE  Final   Colony Count   Final    8,000 COLONIES/ML Performed at [facility]    Culture   Final    INSIGNIFICANT GROWTH Performed at [facility]    Report Status 08/11/2014 FINAL  Final     Labs: Basic Metabolic Panel:  Recent Labs Lab 08/15/14 0530 08/16/14 0401 08/17/14 0528 08/18/14 0426 08/19/14 0540  NA 142 139 141 138 141  K 3.6 3.5 3.9 3.5  4.6  CL 93* 94* 95* 89* 91*  CO2 40* 37*  39* 42* 41*  GLUCOSE 176* 155* 181* 185* 197*  BUN 44* 46* 48* 50* 56*  CREATININE 1.19 1.09 1.29 1.27 1.28  CALCIUM 8.2* 7.9* 7.9* 7.8* 8.1*   Liver Function Tests: No results for input(s): AST, ALT, ALKPHOS, BILITOT, PROT, ALBUMIN in the last 168 hours. No results for input(s): LIPASE, AMYLASE in the last 168 hours. No results for input(s): AMMONIA in the last 168 hours. CBC:  Recent Labs Lab 08/15/14 0530 08/16/14 0401 08/17/14 0528 08/18/14 0426 08/19/14 0540  WBC 11.9* 13.8* 13.9* 12.8* 10.9*  HGB 12.9* 11.8* 11.9* 12.1* 11.7*  HCT 41.0 37.4* 36.2* 38.0* 36.5*  MCV 87.8 86.6 87.0 86.6 86.5  PLT 207 177 170 154 131*   Cardiac Enzymes: No results for input(s): CKTOTAL, CKMB, CKMBINDEX, TROPONINI in the last 168 hours. BNP: BNP (last 3 results)  Recent Labs  07/10/14 1445 08/09/14 1650  BNP 67.9 41.5    ProBNP (last 3 results)  Recent Labs  04/12/14 1048  PROBNP 616.1*    CBG: No results for input(s): GLUCAP in the last 168 hours.     SignedRamiro Harvest MD [facility] 08/19/2014, 11:41 AM

## 2014-08-19 NOTE — Progress Notes (Signed)
Pt for discharge to [facility].  CSW facilitated pt discharge needs including contacting facility, faxing pt discharge information via [facility], discussing with pt wife at bedside, providing RN phone number to call report, and arranging ambulance transport via [facility] for pt to [facility].   Pt wife supportive and actively involved in pt care. Pt wife expressed that she wanted to ensure that Joetta Manners was aware that pt needed assistance with feeding and needs aspiration precautions at SNF. CSW discussed that MD detailed that information in the discharge summary and CSW updated FL2 with information as well. Pt wife appreciative and plans to discuss with [facility] about getting signs for pt room that reflect the needs.   No further social work needs identified at this time.  CSW signing off.   Loletta Specter, MSW, LCSW Clinical Social Work [phone]

## 2014-08-19 NOTE — Progress Notes (Signed)
Report called to Antionette at [facility]

## 2014-09-11 ENCOUNTER — Ambulatory Visit: Payer: Medicare Other | Admitting: Critical Care Medicine

## 2014-10-01 ENCOUNTER — Encounter: Payer: Self-pay | Admitting: Critical Care Medicine

## 2014-10-01 ENCOUNTER — Ambulatory Visit (INDEPENDENT_AMBULATORY_CARE_PROVIDER_SITE_OTHER): Payer: Medicare Other | Admitting: Critical Care Medicine

## 2014-10-01 VITALS — BP 110/80 | HR 89 | Temp 98.3°F | Ht 67.0 in | Wt 165.0 lb

## 2014-10-01 DIAGNOSIS — J189 Pneumonia, unspecified organism: Secondary | ICD-10-CM | POA: Diagnosis not present

## 2014-10-01 DIAGNOSIS — Z66 Do not resuscitate: Secondary | ICD-10-CM | POA: Diagnosis not present

## 2014-10-01 DIAGNOSIS — J441 Chronic obstructive pulmonary disease with (acute) exacerbation: Secondary | ICD-10-CM

## 2014-10-01 DIAGNOSIS — Z515 Encounter for palliative care: Secondary | ICD-10-CM

## 2014-10-01 DIAGNOSIS — I209 Angina pectoris, unspecified: Secondary | ICD-10-CM

## 2014-10-01 DIAGNOSIS — J449 Chronic obstructive pulmonary disease, unspecified: Secondary | ICD-10-CM

## 2014-10-01 MED ORDER — IPRATROPIUM-ALBUTEROL 0.5-2.5 (3) MG/3ML IN SOLN: 3.0000 mL | Freq: Four times a day (QID) | RESPIRATORY_TRACT | Status: AC

## 2014-10-01 MED ORDER — ALBUTEROL SULFATE (2.5 MG/3ML) 0.083% IN NEBU
2.5000 mg | INHALATION_SOLUTION | Freq: Once | RESPIRATORY_TRACT | Status: AC
  Administered 2014-10-01: 2.5 mg via RESPIRATORY_TRACT

## 2014-10-01 MED ORDER — PREDNISONE 10 MG PO TABS: ORAL_TABLET | ORAL | Status: AC

## 2014-10-01 NOTE — Assessment & Plan Note (Signed)
[facility] D copd  End stage Plan  scheduled duoneb dnr status  Hospice care

## 2014-10-01 NOTE — Assessment & Plan Note (Signed)
Pt and spouse desire comfort measures only  Plan Discussed with pcp dr Felipa Eth Plan hospice referral in snf

## 2014-10-01 NOTE — Progress Notes (Signed)
Subjective:    Patient ID: [name] [name], male    DOB: 12-21-1931, 81 y.o.   MRN: [id number]  HPI.  81 y.o.  male with COPD and hypoxic respiratory failure  10/01/2014 Chief Complaint  Patient presents with  . Follow-up    Pt uses 2.5L of O2 continuously. C/o wheezing, SOB, occasional dry cough.     Pt in hosp twice since 05/2014.  Pt is still at [facility]. Last d/c 3/8:  chf dias, copd exac, hcap, resp failure, ams, dementia.    Since there is worse,  Not able to even see, swelling continuously. No real cough.      Review of Systems Constitutional:   No  weight loss, night sweats,  Fevers, chills,  +fatigue, lassitude. HEENT:   No headaches,  Difficulty swallowing,  Tooth/dental problems,  Sore throat,                No sneezing, itching, ear ache, nasal congestion, post nasal drip,   CV:  No chest pain,  Orthopnea, PND, swelling in lower extremities, anasarca, dizziness, palpitations  GI  No heartburn, indigestion, abdominal pain, nausea, vomiting, diarrhea, change in bowel habits, loss of appetite  Resp:    No chest wall deformity  Skin: no rash or lesions.  GU: no dysuria, change in color of urine, no urgency or frequency.  No flank pain.  MS:  No joint pain or swelling.  No decreased range of motion.  No back pain.  Psych:  No change in mood or affect. No depression or anxiety.  + memory loss.     Objective:   Physical Exam Filed Vitals:   10/01/14 1048  BP: 110/80  Pulse: 89  Temp: 98.3 F (36.8 C)  TempSrc: Oral  Height: 5\' 7"  (1.702 m)  Weight: 165 lb (74.844 kg)  SpO2: 98%    Gen: Pleasant, elderly , in no distress,  normal affect, wheelchair , chronically ill appeairng.   ENT: No lesions,  mouth clear,  oropharynx clear, no postnasal drip  Neck: No JVD, no TMG, no carotid bruits  Lungs: No use of accessory muscles, no dullness to percussion, trace rhonchi , distant bs  Cardiovascular: RRR, heart sounds normal, no murmur or gallops, 1+  peripheral edema  Abdomen: soft and NT, no HSM,  BS normal  Musculoskeletal: No deformities, no cyanosis or clubbing  Neuro: alert, non focal  Skin: Warm, no lesions or rashes     Assessment & Plan:   Pneumonia Recent hcap resolved   Hospice care Pt and spouse desire comfort measures only  Plan Discussed with pcp dr Plan hospice referral in snf   DNR (do not resuscitate) dnr and most forms filled out 10/01/2014    Obstructive chronic bronchitis without exacerbation Gold D Gold D copd  End stage Plan  scheduled duoneb dnr status  Hospice care      Updated Medication List Outpatient Encounter Prescriptions as of 10/01/2014  Medication Sig  . acetaminophen (TYLENOL) 500 MG tablet Take 1,000 mg by mouth daily as needed for mild pain or headache.  . albuterol (PROVENTIL) (2.5 MG/3ML) 0.083% nebulizer solution Take 3 mLs (2.5 mg total) by nebulization every 4 (four) hours as needed for wheezing.  10/03/2014 aspirin 325 MG EC tablet Take 325 mg by mouth daily.    Marland Kitchen b complex vitamins tablet Take 1 tablet by mouth daily.  . cholecalciferol (VITAMIN D) 1000 UNITS tablet Take 1,000 Units by mouth daily.    . divalproex (  DEPAKOTE SPRINKLE) 125 MG capsule Take 250 mg by mouth 2 (two) times daily.  Marland Kitchen donepezil (ARICEPT) 10 MG tablet Take 20 mg by mouth at bedtime.   . enoxaparin (LOVENOX) 40 MG/0.4ML injection Inject 0.4 mLs (40 mg total) into the skin daily.  . furosemide (LASIX) 40 MG tablet Take 1 tablet (40 mg total) by mouth 2 (two) times daily.  Marland Kitchen guaiFENesin (MUCINEX) 600 MG 12 hr tablet Take 1 tablet (600 mg total) by mouth 2 (two) times daily as needed for cough or to loosen phlegm.  . haloperidol (HALDOL) 1 MG tablet Take 1-2 tablets (1-2 mg total) by mouth every 8 (eight) hours as needed for agitation.  Marland Kitchen loratadine (CLARITIN) 10 MG tablet Take 10 mg by mouth daily.  . nitroGLYCERIN (NITROSTAT) 0.4 MG SL tablet Place 0.4 mg under the tongue every 5 (five) minutes x 3  doses as needed. For chest pain.  . potassium chloride 20 MEQ TBCR Take 20 mEq by mouth daily.  Marland Kitchen PROAIR HFA 108 (90 BASE) MCG/ACT inhaler inhale 2 puffs by mouth every 4 hours if needed for wheezing  . traZODone (DESYREL) 50 MG tablet Take 0.5 tablets (25 mg total) by mouth at bedtime.  . famotidine (PEPCID) 20 MG tablet Take 20 mg by mouth as needed for heartburn.   Marland Kitchen ipratropium-albuterol (DUONEB) 0.5-2.5 (3) MG/3ML SOLN Take 3 mLs by nebulization every 6 (six) hours.  Marland Kitchen leflunomide (ARAVA) 20 MG tablet Take 20 mg by mouth daily.  . pravastatin (PRAVACHOL) 40 MG tablet take 1 tablet by mouth at bedtime (Patient not taking: Reported on 10/01/2014)  . predniSONE (DELTASONE) 10 MG tablet Take 4 for three days 3 for three days 2 for three days 1 for three days and stop  . [DISCONTINUED] budesonide (PULMICORT) 0.25 MG/2ML nebulizer solution Take 2 mLs (0.25 mg total) by nebulization 2 (two) times daily. (Patient not taking: Reported on 10/01/2014)  . [DISCONTINUED] ipratropium-albuterol (DUONEB) 0.5-2.5 (3) MG/3ML SOLN Take 3 mLs by nebulization every 6 (six) hours. (Patient not taking: Reported on 10/01/2014)  . [DISCONTINUED] predniSONE (DELTASONE) 20 MG tablet Take 3 tablets (60 mg total) by mouth daily before breakfast. Take 3 tablets (60mg ) daily for 4 days, then 2 tablets (40mg ) daily x 4 days, then 1 tablet (20mg ) daily x 4 days then stop. (Patient not taking: Reported on 10/01/2014)  . [EXPIRED] albuterol (PROVENTIL) (2.5 MG/3ML) 0.083% nebulizer solution 2.5 mg

## 2014-10-01 NOTE — Assessment & Plan Note (Signed)
Recent hcap resolved

## 2014-10-01 NOTE — Patient Instructions (Addendum)
Hospice will be called in to nursing home Prednisone 10mg  : Take 4 for three days 3 for three days 2 for three days 1 for three days and stop Duoneb 4 times daily All orders sent to nursing home Dr will follow up MOST and DNR forms filled out

## 2014-10-01 NOTE — Assessment & Plan Note (Signed)
dnr and most forms filled out 10/01/2014

## 2014-11-05 ENCOUNTER — Telehealth: Payer: Self-pay | Admitting: Critical Care Medicine

## 2014-11-05 DIAGNOSIS — J449 Chronic obstructive pulmonary disease, unspecified: Secondary | ICD-10-CM

## 2014-11-05 DIAGNOSIS — J4489 Other specified chronic obstructive pulmonary disease: Secondary | ICD-10-CM

## 2014-11-05 NOTE — Telephone Encounter (Signed)
[name] [name] at [facility] states you called to inquire if the pt was on their service. She states they are NOT and that if you have any further questions to give her a call back.

## 2014-11-06 NOTE — Telephone Encounter (Signed)
Spoke with Dr. Delford Field.  As hospice order has not yet been placed, he would like to proceed with ordering Hospice for pt.  Pt is in [facility].  Order placed.  lmomtcb for pt's wife to ask for me.  Please send call to my box so I can talk with Mrs. Markgraf.    Note:  Dr. Adrian Prince is following pt in [facility].  I spoke with his nurse, Misty Stanley, who advised Dr. Evlyn Kanner is in agreement for hospice for pt.

## 2014-11-06 NOTE — Telephone Encounter (Signed)
Update for Crystal and PW; please advise if we can do anything further for you on this patient or can we close the phone note. Thanks.

## 2014-11-07 ENCOUNTER — Encounter: Payer: Self-pay | Admitting: Critical Care Medicine

## 2014-11-07 DIAGNOSIS — Z515 Encounter for palliative care: Secondary | ICD-10-CM | POA: Insufficient documentation

## 2014-11-07 NOTE — Telephone Encounter (Signed)
Pt's wife returned call - [phone] or cell [phone]

## 2014-11-07 NOTE — Telephone Encounter (Signed)
Spoke with pt's wife.  Discussed below with her.  Wife states hospice did come out yesterday.  She was very thankful for [place] following through with this and placing order.  Will sign off and route to PW as FYI.

## 2014-11-07 NOTE — Telephone Encounter (Signed)
Thank you :)

## 2015-05-14 DEATH — deceased

## 2016-11-07 IMAGING — CR DG CHEST 2V
2 series · 2 of 2 positions shown · non-contrast
Comparison: Portable chest x-ray of 04/14/2014 and two-view chest
x-ray of 04/12/2014

CLINICAL DATA: Cough, congestion, wheezing, shortness of breath,
former smoking history

EXAM:
CHEST  2 VIEW

[view not recorded (1 of 2)]
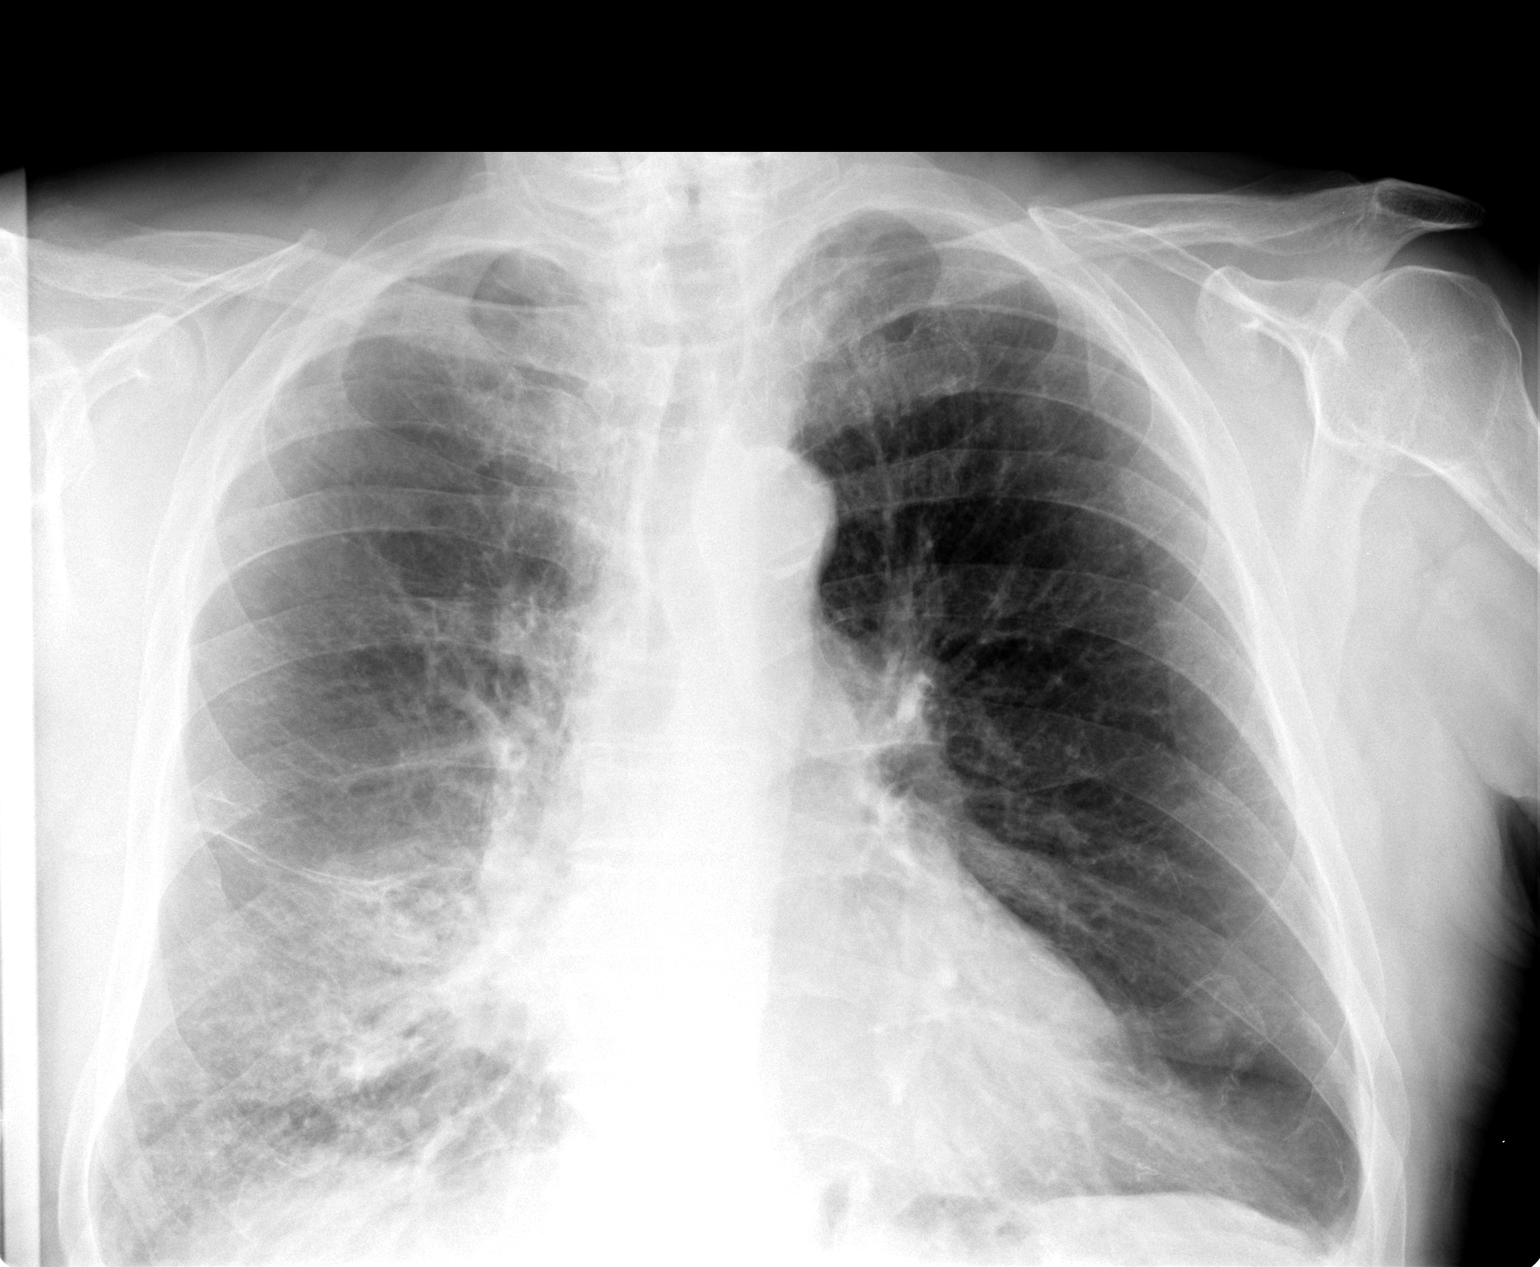

[view not recorded (2 of 2)]
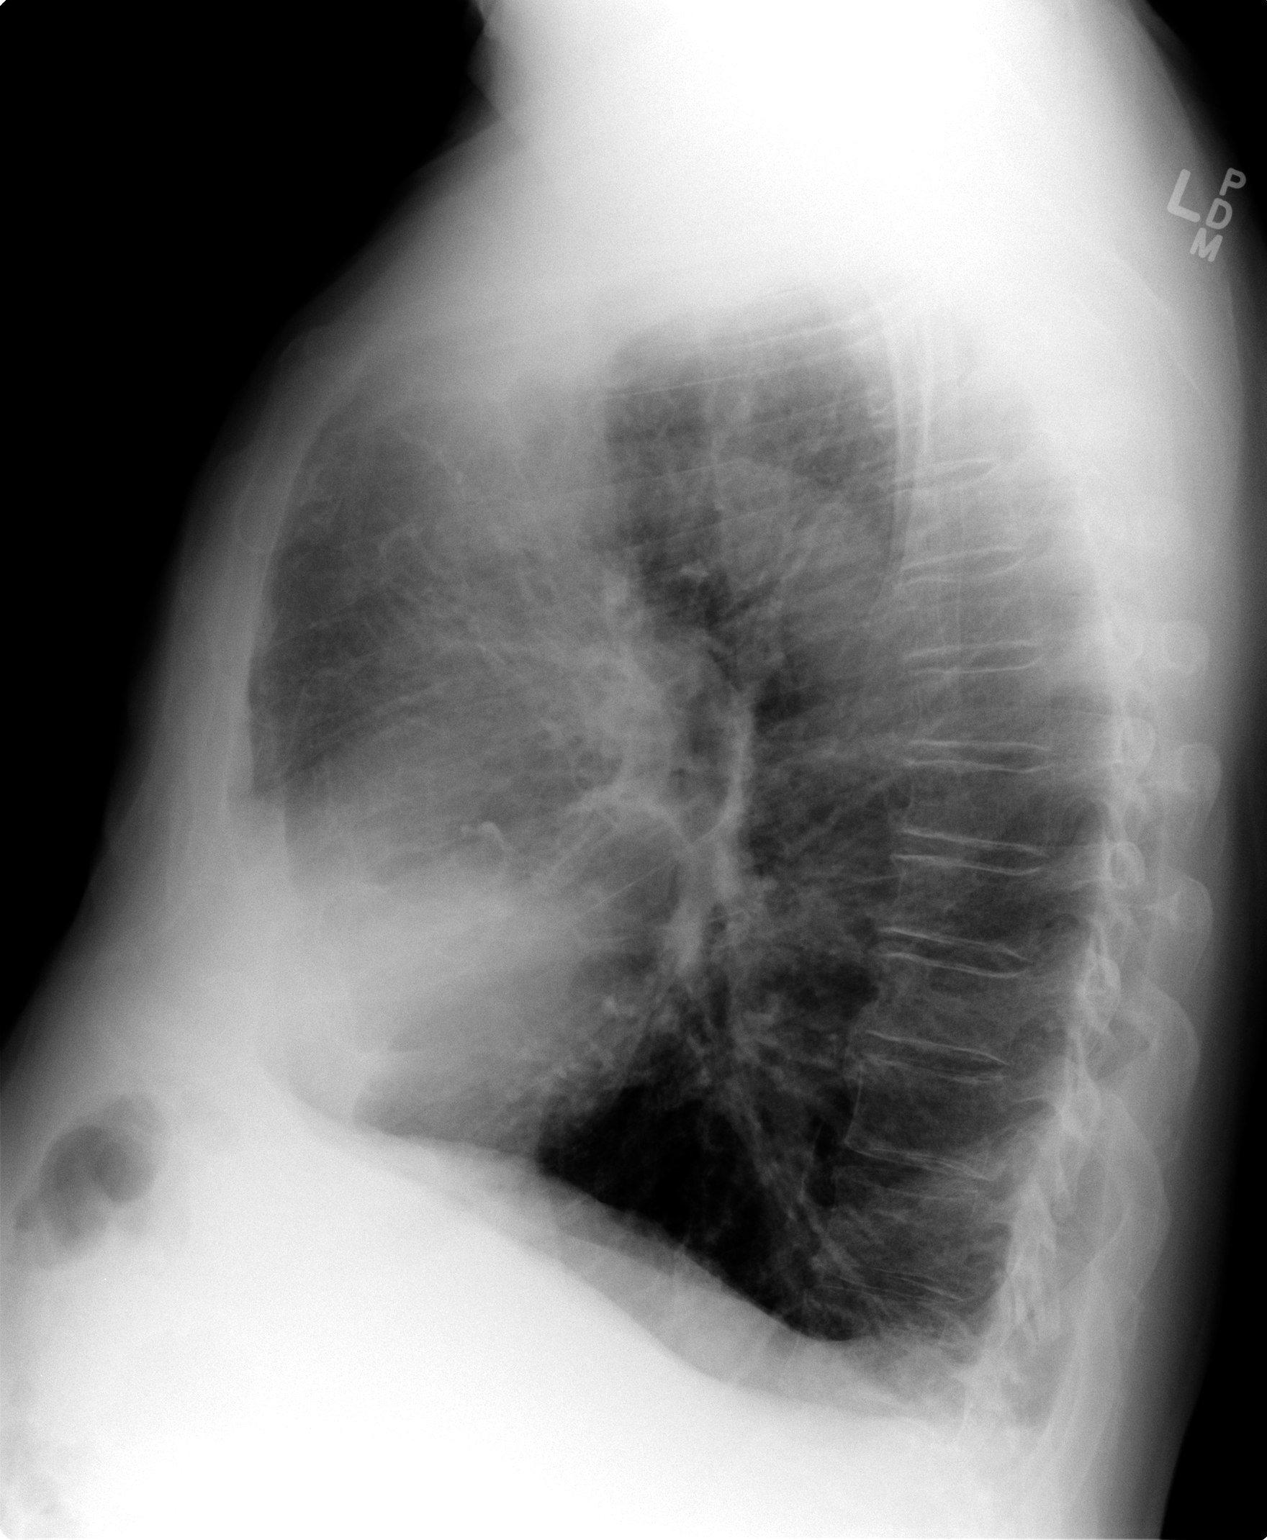

[2 of 2 positions shown; findings below may reference images not displayed]

FINDINGS: There is more opacity at the right lung base primarily in the right
middle lobe suspicious for worsening pneumonia. The lungs are
hyperaerated consistent with emphysema with flattened hemidiaphragms
and increased AP diameter. Somewhat more prominent markings also are
noted in the right upper lobe anteriorly on the lateral view. The
abnormality of the right medial lung base appears to have been
present previously and may represent scarring. However in view of
the persistent pneumonia involving the right lower lobe and possibly
right middle lobe, CT of the chest may be helpful to exclude an
underlying neoplasm. The left lung is clear. Mediastinal and hilar
contours are unchanged. There is some peribronchial thickening
present. The heart is mildly enlarged and stable. No acute bony
abnormality is seen.
IMPRESSION: 1. Worsening of right middle lobe and possibly right lower lobe
pneumonia.
2. Irregularity of the right infrahilar region may be due to
scarring but recommend CTof the chest to exclude an underlying
neoplasm.
3. Emphysema.
